# Patient Record
Sex: Female | Born: 1945
Health system: Southern US, Community
[De-identification: ages and names within clinical notes are randomized; demographics above are authoritative.]

## PROBLEM LIST (undated history)

## (undated) DIAGNOSIS — E538 Deficiency of other specified B group vitamins: Secondary | ICD-10-CM

## (undated) DIAGNOSIS — I10 Essential (primary) hypertension: Secondary | ICD-10-CM

## (undated) DIAGNOSIS — Z87442 Personal history of urinary calculi: Secondary | ICD-10-CM

## (undated) DIAGNOSIS — J449 Chronic obstructive pulmonary disease, unspecified: Secondary | ICD-10-CM

## (undated) DIAGNOSIS — D863 Sarcoidosis of skin: Secondary | ICD-10-CM

## (undated) HISTORY — PX: ABDOMINAL HYSTERECTOMY: SHX81

## (undated) HISTORY — DX: Personal history of urinary calculi: Z87.442

## (undated) HISTORY — DX: Chronic obstructive pulmonary disease, unspecified: J44.9

## (undated) HISTORY — DX: Deficiency of other specified B group vitamins: E53.8

## (undated) HISTORY — DX: Sarcoidosis of skin: D86.3

## (undated) HISTORY — DX: Essential (primary) hypertension: I10

## (undated) HISTORY — PX: CHOLECYSTECTOMY: SHX55

---

## 2000-10-02 ENCOUNTER — Other Ambulatory Visit: Admission: RE | Admit: 2000-10-02 | Discharge: 2000-10-02 | Payer: Self-pay | Admitting: Obstetrics and Gynecology

## 2000-10-28 ENCOUNTER — Encounter: Admission: RE | Admit: 2000-10-28 | Discharge: 2000-10-28 | Payer: Self-pay | Admitting: Family Medicine

## 2000-10-28 ENCOUNTER — Encounter: Payer: Self-pay | Admitting: Family Medicine

## 2005-05-07 ENCOUNTER — Emergency Department (HOSPITAL_COMMUNITY): Admission: EM | Admit: 2005-05-07 | Discharge: 2005-05-07 | Payer: Self-pay | Admitting: Emergency Medicine

## 2005-05-14 ENCOUNTER — Encounter: Admission: RE | Admit: 2005-05-14 | Discharge: 2005-05-14 | Payer: Self-pay | Admitting: Family Medicine

## 2005-06-11 ENCOUNTER — Encounter: Admission: RE | Admit: 2005-06-11 | Discharge: 2005-06-11 | Payer: Self-pay | Admitting: Family Medicine

## 2005-06-13 ENCOUNTER — Encounter: Admission: RE | Admit: 2005-06-13 | Discharge: 2005-06-13 | Payer: Self-pay | Admitting: Family Medicine

## 2006-08-18 ENCOUNTER — Ambulatory Visit: Payer: Self-pay | Admitting: Critical Care Medicine

## 2006-08-19 ENCOUNTER — Ambulatory Visit: Payer: Self-pay | Admitting: Cardiology

## 2006-09-25 ENCOUNTER — Ambulatory Visit: Payer: Self-pay | Admitting: Critical Care Medicine

## 2006-11-24 ENCOUNTER — Ambulatory Visit: Payer: Self-pay | Admitting: Critical Care Medicine

## 2008-03-04 LAB — CONVERTED CEMR LAB: Pap Smear: NORMAL

## 2009-01-03 ENCOUNTER — Emergency Department (HOSPITAL_COMMUNITY): Admission: EM | Admit: 2009-01-03 | Discharge: 2009-01-03 | Payer: Self-pay | Admitting: Family Medicine

## 2009-11-29 ENCOUNTER — Emergency Department (HOSPITAL_COMMUNITY): Admission: EM | Admit: 2009-11-29 | Discharge: 2009-11-29 | Payer: Self-pay | Admitting: Emergency Medicine

## 2010-03-16 ENCOUNTER — Other Ambulatory Visit: Payer: Self-pay | Admitting: Internal Medicine

## 2010-03-16 ENCOUNTER — Encounter: Payer: Self-pay | Admitting: Internal Medicine

## 2010-03-16 ENCOUNTER — Ambulatory Visit
Admission: RE | Admit: 2010-03-16 | Discharge: 2010-03-16 | Payer: Self-pay | Source: Home / Self Care | Attending: Internal Medicine | Admitting: Internal Medicine

## 2010-03-16 ENCOUNTER — Telehealth: Payer: Self-pay | Admitting: Internal Medicine

## 2010-03-16 DIAGNOSIS — D869 Sarcoidosis, unspecified: Secondary | ICD-10-CM | POA: Insufficient documentation

## 2010-03-16 DIAGNOSIS — R040 Epistaxis: Secondary | ICD-10-CM | POA: Insufficient documentation

## 2010-03-16 DIAGNOSIS — I10 Essential (primary) hypertension: Secondary | ICD-10-CM | POA: Insufficient documentation

## 2010-03-16 DIAGNOSIS — Z87442 Personal history of urinary calculi: Secondary | ICD-10-CM | POA: Insufficient documentation

## 2010-03-16 LAB — CBC WITH DIFFERENTIAL/PLATELET
Basophils Absolute: 0 10*3/uL (ref 0.0–0.1)
Basophils Relative: 0.6 % (ref 0.0–3.0)
Eosinophils Absolute: 0.2 10*3/uL (ref 0.0–0.7)
Eosinophils Relative: 3.8 % (ref 0.0–5.0)
HCT: 33.6 % — ABNORMAL LOW (ref 36.0–46.0)
Hemoglobin: 11.4 g/dL — ABNORMAL LOW (ref 12.0–15.0)
Lymphocytes Relative: 47.3 % — ABNORMAL HIGH (ref 12.0–46.0)
Lymphs Abs: 2.9 10*3/uL (ref 0.7–4.0)
MCHC: 34 g/dL (ref 30.0–36.0)
MCV: 98.5 fl (ref 78.0–100.0)
Monocytes Absolute: 0.4 10*3/uL (ref 0.1–1.0)
Monocytes Relative: 6.9 % (ref 3.0–12.0)
Neutro Abs: 2.5 10*3/uL (ref 1.4–7.7)
Neutrophils Relative %: 41.4 % — ABNORMAL LOW (ref 43.0–77.0)
Platelets: 595 10*3/uL — ABNORMAL HIGH (ref 150.0–400.0)
RBC: 3.41 Mil/uL — ABNORMAL LOW (ref 3.87–5.11)
RDW: 13.1 % (ref 11.5–14.6)
WBC: 6 10*3/uL (ref 4.5–10.5)

## 2010-03-16 LAB — URINALYSIS, ROUTINE W REFLEX MICROSCOPIC
Bilirubin Urine: NEGATIVE
Leukocytes, UA: NEGATIVE
Nitrite: NEGATIVE
Specific Gravity, Urine: >=1.03 (ref 1.000–1.030)
Urine Glucose: NEGATIVE
Urobilinogen, UA: 1 (ref 0.0–1.0)
pH: 5.5 (ref 5.0–8.0)

## 2010-03-16 LAB — HM MAMMOGRAPHY

## 2010-03-16 LAB — HEPATIC FUNCTION PANEL
ALT: 17 U/L (ref 0–35)
AST: 21 U/L (ref 0–37)
Albumin: 3.6 g/dL (ref 3.5–5.2)
Alkaline Phosphatase: 68 U/L (ref 39–117)
Bilirubin, Direct: 0.1 mg/dL (ref 0.0–0.3)
Total Bilirubin: 0.5 mg/dL (ref 0.3–1.2)
Total Protein: 7.1 g/dL (ref 6.0–8.3)

## 2010-03-16 LAB — BASIC METABOLIC PANEL
BUN: 10 mg/dL (ref 6–23)
CO2: 32 mEq/L (ref 19–32)
Calcium: 9.7 mg/dL (ref 8.4–10.5)
Chloride: 104 mEq/L (ref 96–112)
Creatinine, Ser: 0.7 mg/dL (ref 0.4–1.2)
GFR: 107.02 mL/min (ref >60.00)
Glucose, Bld: 98 mg/dL (ref 70–99)
Potassium: 4.3 mEq/L (ref 3.5–5.1)
Sodium: 141 mEq/L (ref 135–145)

## 2010-03-16 LAB — SEDIMENTATION RATE: Sed Rate: 63 mm/hr — ABNORMAL HIGH (ref 0–22)

## 2010-03-16 LAB — APTT: aPTT: 28.1 s (ref 21.7–28.8)

## 2010-03-16 LAB — HIGH SENSITIVITY CRP: CRP, High Sensitivity: 5.71 mg/L — ABNORMAL HIGH (ref 0.00–5.00)

## 2010-03-16 LAB — PROTIME-INR
INR: 1.1 ratio — ABNORMAL HIGH (ref 0.8–1.0)
Prothrombin Time: 12.7 s — ABNORMAL HIGH (ref 10.2–12.4)

## 2010-03-16 LAB — TSH: TSH: 0.75 u[IU]/mL (ref 0.35–5.50)

## 2010-03-20 ENCOUNTER — Encounter: Payer: Self-pay | Admitting: Internal Medicine

## 2010-03-25 ENCOUNTER — Encounter: Payer: Self-pay | Admitting: Family Medicine

## 2010-03-30 ENCOUNTER — Ambulatory Visit: Admit: 2010-03-30 | Payer: Self-pay | Admitting: Internal Medicine

## 2010-04-02 ENCOUNTER — Telehealth: Payer: Self-pay | Admitting: Internal Medicine

## 2010-04-04 ENCOUNTER — Encounter: Payer: Self-pay | Admitting: Internal Medicine

## 2010-04-05 NOTE — Assessment & Plan Note (Signed)
Summary: NEW PT CPX/ [employer] STATE/ [place]  #   Vital Signs:  Patient profile:   65 year old female Menstrual status:  hysterectomy Height:      66 inches Weight:      142 pounds BMI:     23.00 O2 Sat:      97 % on Room air Temp:     98.0 degrees F oral Pulse rate:   67 / minute Pulse rhythm:   regular Resp:     16 per minute BP sitting:   122 / 84  (left arm) Cuff size:   regular  Vitals Entered By: Rock Nephew CMA (March 16, 2010 9:03 AM)  O2 Flow:  Room air CC: New to establish, Preventive Care Is Patient Diabetic? No Pain Assessment Patient in pain? no       Does patient need assistance? Functional Status Self care Ambulation Normal     Menstrual Status hysterectomy Last PAP Result normal   Primary Care Provider:  [name] MD  CC:  New to establish and Preventive Care.  History of Present Illness: New to me she describes URI symptoms for about one month, she has had nasal congestion and runny nose that has slowly improved. She had right sided epistaxis about 2-3 weeks ago so she went to an [facility] and she has been using an OTC nasal spray with some relief. The bleeding has dissipated. She has had a mild NP cough as well but no SOB or night sweats.  Preventive Screening-Counseling & Management  Alcohol-Tobacco     Alcohol drinks/day: 0     Alcohol Counseling: not indicated; patient does not drink     Smoking Status: never     Tobacco Counseling: not indicated; no tobacco use  Caffeine-Diet-Exercise     Does Patient Exercise: yes  Hep-HIV-STD-Contraception     Hepatitis Risk: no risk noted     HIV Risk: no risk noted     STD Risk: no risk noted      Sexual History:  currently monogamous.        Drug Use:  no.        Blood Transfusions:  no.    Medications Prior to Update: 1)  None  Current Medications (verified): 1)  None  Allergies (verified): No Known Drug Allergies  Past History:  Past Medical History: Sarcoid of the  skin Nephrolithiasis, hx of Hypertension  Past Surgical History: Cholecystectomy Hysterectomy  Family History: Family History Hypertension  Social History: Occupation: special needs educator Divorced Never Smoked Alcohol use-no Drug use-no Regular exercise-yes Smoking Status:  never Hepatitis Risk:  no risk noted HIV Risk:  no risk noted STD Risk:  no risk noted Sexual History:  currently monogamous Blood Transfusions:  no Drug Use:  no Does Patient Exercise:  yes  Review of Systems       The patient complains of prolonged cough.  The patient denies anorexia, fever, weight loss, weight gain, hoarseness, chest pain, syncope, dyspnea on exertion, peripheral edema, headaches, hemoptysis, abdominal pain, melena, hematochezia, severe indigestion/heartburn, hematuria, suspicious skin lesions, difficulty walking, enlarged lymph nodes, and angioedema.   General:  Complains of fatigue; denies chills, fever, loss of appetite, malaise, sleep disorder, sweats, weakness, and weight loss. ENT:  Complains of nasal congestion and nosebleeds; denies decreased hearing, difficulty swallowing, ear discharge, earache, hoarseness, postnasal drainage, ringing in ears, sinus pressure, and sore throat. Resp:  Complains of cough; denies chest discomfort, chest pain with inspiration, coughing up blood, excessive snoring,  morning headaches, pleuritic, shortness of breath, sputum productive, and wheezing. Derm:  Denies changes in color of skin, changes in nail beds, dryness, excessive perspiration, flushing, hair loss, itching, lesion(s), and rash. Heme:  Denies abnormal bruising, enlarge lymph nodes, fevers, pallor, and skin discoloration.  Physical Exam  General:  alert, well-developed, well-nourished, well-hydrated, appropriate dress, normal appearance, healthy-appearing, cooperative to examination, good hygiene, and overweight-appearing.   Head:  normocephalic, atraumatic, no abnormalities observed,  and no abnormalities palpated.   Eyes:  vision grossly intact, pupils equal, and no injection.   Ears:  R ear normal and L ear normal.   Nose:  no external deformity, no nasal discharge, no airflow obstruction, no intranasal foreign body, no nasal polyps, no nasal mucosal lesions, no mucosal friability, no active bleeding or clots, no sinus percussion tenderness, no septum abnormalities, nasal mucosal pallor, and mucosal edema.   Mouth:  good dentition, pharynx pink and moist, no erythema, no exudates, no posterior lymphoid hypertrophy, no postnasal drip, no pharyngeal crowing, no lesions, no aphthous ulcers, no erosions, no tongue abnormalities, no leukoplakia, and no petechiae.   Neck:  supple, full ROM, no masses, no thyromegaly, no thyroid nodules or tenderness, no JVD, no carotid bruits, no cervical lymphadenopathy, and no neck tenderness.   Lungs:  normal respiratory effort, no intercostal retractions, no accessory muscle use, normal breath sounds, no dullness, no fremitus, no crackles, and no wheezes.   Heart:  normal rate, regular rhythm, no murmur, no gallop, no rub, and no JVD.   Abdomen:  soft, non-tender, normal bowel sounds, no distention, no masses, no guarding, no rigidity, no rebound tenderness, no abdominal hernia, no inguinal hernia, no hepatomegaly, no splenomegaly, and abdominal scar(s).   Msk:  No deformity or scoliosis noted of thoracic or lumbar spine.   Pulses:  R and L carotid,radial,femoral,dorsalis pedis and posterior tibial pulses are full and equal bilaterally Extremities:  No clubbing, cyanosis, edema, or deformity noted with normal full range of motion of all joints.   Neurologic:  No cranial nerve deficits noted. Station and gait are normal. Plantar reflexes are down-going bilaterally. DTRs are symmetrical throughout. Sensory, motor and coordinative functions appear intact. Skin:  over her lower legs bilaterally there are areas of PIPA but no papules, vesicles,  excoriations, or erythema Cervical Nodes:  no anterior cervical adenopathy and no posterior cervical adenopathy.   Axillary Nodes:  no R axillary adenopathy and no L axillary adenopathy.   Inguinal Nodes:  no R inguinal adenopathy and no L inguinal adenopathy.   Psych:  Cognition and judgment appear intact. Alert and cooperative with normal attention span and concentration. No apparent delusions, illusions, hallucinations   Impression & Recommendations:  Problem # 1:  HYPERTENSION (ICD-401.9) Assessment Improved  BP today: 122/84  Problem # 2:  COUGH (ICD-786.2) Assessment: New will check for sarcoid involvement, mass, pna, edema Orders: T-2 View CXR (71020TC) Venipuncture (19147) TLB-BMP (Basic Metabolic Panel-BMET) (80048-METABOL) TLB-CBC Platelet - w/Differential (85025-CBCD) TLB-Hepatic/Liver Function Pnl (80076-HEPATIC) TLB-TSH (Thyroid Stimulating Hormone) (84443-TSH) TLB-CRP-High Sensitivity (C-Reactive Protein) (86140-FCRP) TLB-Sedimentation Rate (ESR) (85652-ESR) TLB-Udip w/ Micro (81001-URINE) TLB-PT (Protime) (85610-PTP) TLB-PTT (85730-PTTL)  Problem # 3:  SARCOIDOSIS (ICD-135) Assessment: Unchanged  Orders: T-2 View CXR (71020TC) Venipuncture (82956) TLB-BMP (Basic Metabolic Panel-BMET) (80048-METABOL) TLB-CBC Platelet - w/Differential (85025-CBCD) TLB-Hepatic/Liver Function Pnl (80076-HEPATIC) TLB-TSH (Thyroid Stimulating Hormone) (84443-TSH) TLB-CRP-High Sensitivity (C-Reactive Protein) (86140-FCRP) TLB-Sedimentation Rate (ESR) (85652-ESR) TLB-Udip w/ Micro (81001-URINE) TLB-PT (Protime) (85610-PTP) TLB-PTT (85730-PTTL) Pulmonary Referral (Pulmonary)  Problem # 4:  EPISTAXIS, RECURRENT (ICD-784.7) Assessment:  New I think this has been caused by a recent URI and maybe some allergies, I see no bleeding, scabs, or clots today. Will check for a coagulopathy. Orders: Venipuncture (09811) TLB-BMP (Basic Metabolic Panel-BMET) (80048-METABOL) TLB-CBC  Platelet - w/Differential (85025-CBCD) TLB-Hepatic/Liver Function Pnl (80076-HEPATIC) TLB-TSH (Thyroid Stimulating Hormone) (84443-TSH) TLB-CRP-High Sensitivity (C-Reactive Protein) (86140-FCRP) TLB-Sedimentation Rate (ESR) (85652-ESR) TLB-Udip w/ Micro (81001-URINE) TLB-PT (Protime) (85610-PTP) TLB-PTT (85730-PTTL)  Other Orders: Radiology Referral (Radiology) Gastroenterology Referral (GI)  Colorectal Screening:  Current Recommendations:    Colonoscopy recommended: scheduled with G.I.  PAP Screening:    Hx Cervical Dysplasia in last 5 yrs? No    3 normal PAP smears in last 5 yrs? Yes    Last PAP smear:  03/04/2008    Reviewed PAP smear recommendations:  patient refuses understanding risks of delayed diagnosis  Mammogram Screening:    Last Mammogram:  03/04/2008    Reviewed Mammogram recommendations:  mammogram ordered  Osteoporosis Risk Assessment:  Risk Factors for Fracture or Low Bone Density:   Smoking status:       never  Immunization & Chemoprophylaxis:    Tetanus vaccine: Adacel  (03/04/2009)  Patient Instructions: 1)  Please schedule a follow-up appointment in 2 weeks. 2)  Check your Blood Pressure regularly. If it is above 140/90: you should make an appointment. 3)  Get plenty of rest, drink lots of clear liquids, and use Tylenol or Ibuprofen for fever and comfort. Return in 7-10 days if you're not better:sooner if you're feeling worse.   Orders Added: 1)  T-2 View CXR [71020TC] 2)  Venipuncture [36415] 3)  TLB-BMP (Basic Metabolic Panel-BMET) [80048-METABOL] 4)  TLB-CBC Platelet - w/Differential [85025-CBCD] 5)  TLB-Hepatic/Liver Function Pnl [80076-HEPATIC] 6)  TLB-TSH (Thyroid Stimulating Hormone) [84443-TSH] 7)  TLB-CRP-High Sensitivity (C-Reactive Protein) [86140-FCRP] 8)  TLB-Sedimentation Rate (ESR) [85652-ESR] 9)  TLB-Udip w/ Micro [81001-URINE] 10)  TLB-PT (Protime) [85610-PTP] 11)  TLB-PTT [85730-PTTL] 12)  Radiology Referral [Radiology] 13)   Gastroenterology Referral [GI] 14)  New Patient Level V [99205] 15)  Pulmonary Referral [Pulmonary]    Preventive Care Screening  Last Tetanus Booster:    Date:  03/04/2009    Results:  Adacel   Mammogram:    Date:  03/04/2008    Results:  normal   Pap Smear:    Date:  03/04/2008    Results:  normal    Prevention & Chronic Care Immunizations   Influenza vaccine: Not documented   Influenza vaccine deferral: Refused  (03/16/2010)    Tetanus booster: 03/04/2009: Adacel    Pneumococcal vaccine: Not documented    H. zoster vaccine: Not documented   H. zoster vaccine deferral: Refused  (03/16/2010)  Colorectal Screening   Hemoccult: Not documented    Colonoscopy: Not documented   Colonoscopy action/deferral: scheduled with G.I.  (03/16/2010)  Other Screening   Pap smear: normal  (03/04/2008)   Pap smear action/deferral: patient refuses understanding risks of delayed diagnosis  (03/16/2010)    Mammogram: normal  (03/04/2008)   Mammogram action/deferral: mammogram ordered  (03/16/2010)    DXA bone density scan: Not documented   Smoking status: never  (03/16/2010)  Lipids   Total Cholesterol: Not documented   LDL: Not documented   LDL Direct: Not documented   HDL: Not documented   Triglycerides: Not documented  Hypertension   Last Blood Pressure: 122 / 84  (03/16/2010)   Serum creatinine: Not documented   Serum potassium Not documented  Self-Management Support :    Hypertension self-management support: Not documented

## 2010-04-05 NOTE — Letter (Signed)
Summary: Results Follow-up Letter  [facility]  [address] [place], [place] [address]   Phone: [phone]  Fax: [phone]    03/16/2010  [address] [place], [place]  [address]  Dear Ms. Stanczak,   The following are the results of your recent test(s):  Test     Result     Chest Xray     pneumonia and scarring CBC       anemia and high platelets Liver/kidney   normal Urine       normal   _________________________________________________________  Please call for an appointment soon _________________________________________________________ _________________________________________________________ _________________________________________________________  Sincerely,  Sanda Linger MD [facility]

## 2010-04-05 NOTE — Letter (Signed)
Summary: Unable To Reach-Consult Scheduled  [facility]  [address] [place], [place] [address]   Phone: [phone]  Fax: [phone]    03/20/2010 MRN: [id number]    Dear Ms. Sanzone,   We have been unable to reach you by phone.  Please contact our office regarding your recent chest xray results.    Thank you,    Alvy Beal A,CMA for Dr. Bea Laura. [facility]

## 2010-04-05 NOTE — Progress Notes (Signed)
  Phone Note Outgoing Call   Summary of Call: LA - please let her know that her chest xray shows a probable pneumonia today, ask her where she wants the antibiotic Rx sent to  TJ Initial call taken by: [name] MD,  March 16, 2010 11:49 AM  Follow-up for Phone Call        LM with other for pt to call  back.Alvy Beal Archie CMA  March 16, 2010 1:26 PM  Follow-up by: [name] MD,  March 16, 2010 11:48 AM  Additional Follow-up for Phone Call Additional follow up Details #1::        Called pt x 2, no vm on home phone and work number is for [employer].  will try againg and also mailed her letter for her to contact our office back regarding results.Alvy Beal Archie CMA  March 20, 2010 11:56 AM     Additional Follow-up for Phone Call Additional follow up Details #2::    Tried patient x 2, closing phone note due to multiple attempts. Letter mailed for patient to call us back.Alvy Beal Archie CMA  March 21, 2010 9:21 AM   New/Updated Medications: AVELOX 400 MG TABS (MOXIFLOXACIN HCL) One by mouth once daily for 10 days Prescriptions: AVELOX 400 MG TABS (MOXIFLOXACIN HCL) One by mouth once daily for 10 days  #10 x 1   Entered and Authorized by:   [name] MD   Signed by:   [name] MD on 03/16/2010   Method used:   Historical   RxID:   [id number]

## 2010-04-11 NOTE — Progress Notes (Signed)
Summary: nos appt  Phone Note Call from Patient   Caller: [email or web address]  Call For: Arnisha Laffoon Summary of Call: ATC pt to rsc nos from 1/27 no vm. Initial call taken by: Darletta Moll,  April 02, 2010 10:45 AM     Appended Document: nos appt pat, this is your patient  Carmin Muskrat

## 2010-04-27 ENCOUNTER — Encounter: Payer: Self-pay | Admitting: Internal Medicine

## 2010-05-01 NOTE — Letter (Signed)
Summary: Referral - not able to see patient  [facility]  [address] [place], [place] [address]   Phone: [phone]  Fax: [phone]    April 27, 2010    [name], M.D. [address] [place], [place] [address]   Re:   SREENIDHI GANSON DOB:  12-May-1945 MRN:   [id number]    Dear Dr. Yetta Barre:  Thank you for your kind referral of the above patient.  We have attempted to schedule the recommended procedure Screening Colonoscopy but have not been able to schedule because:   X  The patient was not available by phone and/or has not returned our calls.  ___ The patient declined to schedule the procedure at this time.  We appreciate the referral and hope that we will have the opportunity to treat this patient in the future.    Sincerely,    [facility] [phone]

## 2010-07-17 NOTE — Assessment & Plan Note (Signed)
[facility]                             PULMONARY OFFICE NOTE   NDIA, SAMPATH                    MRN:          [id number]  DATE:08/18/2006                            DOB:          Apr 26, 1945    CHIEF COMPLAINT:  Chronic cough, evaluate sarcoidosis.   HISTORY OF PRESENT ILLNESS:  A 65 year old, African-American female with  a history of sarcoidosis diagnosed in the early 70s. The patient is  unclear whether a biopsy was actually obtained. She was found at that  time to have fibrocystic changes bilaterally and was on chronic steroids  for many years. She eventually took steroids off on her own over 10  years. She was seen in consultation by Dr. Sandrea Hughs in 2002 who felt  that she had burned out sarcoidosis. She only came for one visit and did  not return for followup. In the interim, she did fairly well until 2  weeks ago she developed increasing cough, increased chest congestion  production thick yellow mucous. She was seen at [facility],  given a 7-day course of Levaquin at 500 mg daily which did improve her  status. Symptoms now are better, her cough is less. She is still staying  somewhat fatigued. Her mucous is clear. She is short of breath all the  time and if she walks 10-15 feet or greater or up an include, she is  quite dyspneic. 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 She does have occasional chest tightness, she  denies any hemoptysis. She has no acid reflux or symptoms of  indigestion, denies any loss of appetite. There has no been weight  change, abdominal pain, difficulty swallowing. The patient denies any  sore throat, tooth or dental problems. There is no nasal congestion,  sneezing, itching, earache, anxiety, depression, hand or foot swelling.  She smoked 1/2-pack a day for 10 years, quit smoking in the 1970s.   PAST MEDICAL HISTORY:   MEDICAL:  No history of active medical  issues except for a  cholecystectomy in the 1970s and the sarcoidosis.   MEDICATION ALLERGIES:  Vitamins only.   SOCIAL HISTORY:  Works as noted above in the [employer]  system, is single, has children, lives at home with a friend.   FAMILY HISTORY:  Noncontributory.   PHYSICAL EXAMINATION:  GENERAL:  This is a middle-age, African-American  female in no distress.  VITAL SIGNS:  Temperature 98, blood pressure 102/64, pulse 87,  saturation 95% on room air.  CHEST:  Showed a few dry crackles at the bases otherwise clear prolonged  respiratory phase noted.  CARDIAC:  Showed a regular rate and rhythm without S3, normal S1, S2.  ABDOMEN:  Soft, nontender.  EXTREMITIES:  Showed no edema, clubbing or venous disease.  SKIN:  Clear.  NEUROLOGIC:  Intact.  HEENT:  Showed no jugular venous distention, no lymphadenopathy.  Oropharynx clear.  NECK:  Supple.   Chest x-ray obtained from August 12, 2006 at [facility] showed  bilateral apical fibrotic changes of a large cavitary lesion right upper  lobe,  this appears to be unchanged compared to a verbal report from a CT  of the chest August 2002 which showed extensive bilateral upper lobe  parenchymal pleural scarring, bleb and bulla formation is evident in the  upper lobes.   IMPRESSION:  Sarcoidosis with chronic fibrocystic changes and no  evidence of active disease process at this time. Recent  tracheobronchitis now improved.   PLAN:  Obtain a CT scan of the chest on August 19, 2006 and full set of  pulmonary functions. The patient is to return to see me at that time on  September 25, 2006. No change in the patient's medication profile is made at  this time.     Charlcie Cradle Delford Field, MD, [facility]  Electronically Signed    PEW/MedQ  DD: 08/18/2006  DT: 08/19/2006  Job #: [id number]   cc:   Peyton Najjar, MD

## 2010-07-17 NOTE — Assessment & Plan Note (Signed)
[facility]                             PULMONARY OFFICE NOTE   Patricia Davies, Patricia Davies                    MRN:          [id number]  DATE:11/24/2006                            DOB:          Aug 18, 1945    Patricia Davies is a 65 year old African-American female, history of  sarcoidosis with chronic fibro-proliferation and bronchiectasis.  Pulmonary functions have been obtained and are stable.  CT scan of the  chest also is stable from June.  She is having no active complaints at  this time, except for dyspnea on exertion.   EXAM:  Temperature 97, blood pressure 130/80, pulse 73, saturation 96%  on room air.  CHEST:  Showed to be clear without evidence of wheeze or rhonchi.  CARDIAC:  Regular rate and rhythm without S3.  Normal S1, S2.   IMPRESSION:  Sarcoidosis, stable at this time.   PLAN:  To maintain off systemic medications, and we will see this  patient back in return in 4 months.     Charlcie Cradle Delford Field, MD, [facility]  Electronically Signed    PEW/MedQ  DD: 11/24/2006  DT: 11/25/2006  Job #: [id number]

## 2010-07-17 NOTE — Assessment & Plan Note (Signed)
[facility]                             PULMONARY OFFICE NOTE   TURQUOISE, ESCH                    MRN:          [id number]  DATE:09/25/2006                            DOB:          September 23, 1945    Ms. Gentzler returns in followup, a 65 year old African-American female  with sarcoidosis.  She underwent CT scanning of the chest which revealed  extensive pulmonary scarring and retraction compatible to sarcoidosis.  There was bilateral bronchiectasis and also ill defined air space  disease in the right upper lobe, not previously present.  We gave her  another additional 10 day course of Levaquin and she completed this.  She states her cough and congestion are markedly better.  She is having  no shortness of breath or cough at this time.  She is here today for a  pulmonary function testing.   On exam temp 97, blood pressure 118/74, pulse of 68, saturation was 97%  room air.  CHEST:  Clear without evidence of adventitious breath sounds.  There was  no evidence of wheeze or rhonchi.  CARDIAC:  Regular rate and rhythm without S3.  Normal S1, S2.  ABDOMEN:  Soft, nontender.  EXTREMITIES:  No edema or clubbing.  SKIN:  Clear.   PULMONARY FUNCTIONS:  Obtained, showing an FEV1 of 56% predicted, FEC is  73% predicted.  Total lung capacity at 67% predicted.  Diffusion  capacity at 42% predicted.   IMPRESSION:  Is that of fibrocystic sarcoidosis with bronchiectasis,  stable at this time.  No indication for systemic steroids.  No  indication for topical inhaled medications.   PLAN:  Is to maintain off prednisone and return in followup in 4 months  for a recheck.     Charlcie Cradle Delford Field, MD, [facility]  Electronically Signed    PEW/MedQ  DD: 09/25/2006  DT: 09/26/2006  Job #: [id number]   cc:   Peyton Najjar, MD

## 2010-12-15 ENCOUNTER — Emergency Department (HOSPITAL_COMMUNITY): Payer: BC Managed Care – PPO

## 2010-12-15 ENCOUNTER — Inpatient Hospital Stay (HOSPITAL_COMMUNITY)
Admission: EM | Admit: 2010-12-15 | Discharge: 2010-12-19 | DRG: 089 | Disposition: A | Discharge status: Home / Self Care | Payer: BC Managed Care – PPO | Attending: Internal Medicine | Admitting: Internal Medicine

## 2010-12-15 DIAGNOSIS — E876 Hypokalemia: Secondary | ICD-10-CM | POA: Diagnosis present

## 2010-12-15 DIAGNOSIS — D869 Sarcoidosis, unspecified: Secondary | ICD-10-CM | POA: Diagnosis present

## 2010-12-15 DIAGNOSIS — Z23 Encounter for immunization: Secondary | ICD-10-CM

## 2010-12-15 DIAGNOSIS — D72829 Elevated white blood cell count, unspecified: Secondary | ICD-10-CM | POA: Diagnosis present

## 2010-12-15 DIAGNOSIS — J189 Pneumonia, unspecified organism: Principal | ICD-10-CM | POA: Diagnosis present

## 2010-12-15 DIAGNOSIS — Z79899 Other long term (current) drug therapy: Secondary | ICD-10-CM

## 2010-12-15 DIAGNOSIS — D509 Iron deficiency anemia, unspecified: Secondary | ICD-10-CM | POA: Diagnosis present

## 2010-12-15 DIAGNOSIS — N390 Urinary tract infection, site not specified: Secondary | ICD-10-CM | POA: Diagnosis present

## 2010-12-15 LAB — URINE MICROSCOPIC-ADD ON

## 2010-12-15 LAB — BASIC METABOLIC PANEL
Calcium: 9.7 mg/dL (ref 8.4–10.5)
Chloride: 98 mEq/L (ref 96–112)
GFR calc Af Amer: 84 mL/min — ABNORMAL LOW (ref >90)
Potassium: 3 mEq/L — ABNORMAL LOW (ref 3.5–5.1)
Sodium: 140 mEq/L (ref 135–145)

## 2010-12-15 LAB — DIFFERENTIAL
Basophils Absolute: 0.2 10*3/uL — ABNORMAL HIGH (ref 0.0–0.1)
Lymphocytes Relative: 27 % (ref 12–46)
Monocytes Relative: 14 % — ABNORMAL HIGH (ref 3–12)
Neutrophils Relative %: 56 % (ref 43–77)

## 2010-12-15 LAB — COMPREHENSIVE METABOLIC PANEL
BUN: 20 mg/dL (ref 6–23)
CO2: 31 mEq/L (ref 19–32)
Chloride: 101 mEq/L (ref 96–112)
Total Bilirubin: 0.7 mg/dL (ref 0.3–1.2)
Total Protein: 7.7 g/dL (ref 6.0–8.3)

## 2010-12-15 LAB — URINALYSIS, ROUTINE W REFLEX MICROSCOPIC
Ketones, ur: 15 mg/dL — AB
Nitrite: POSITIVE — AB
Protein, ur: 100 mg/dL — AB
Specific Gravity, Urine: 1.031 — ABNORMAL HIGH (ref 1.005–1.030)
Urobilinogen, UA: 2 mg/dL — ABNORMAL HIGH (ref 0.0–1.0)

## 2010-12-15 LAB — RAPID URINE DRUG SCREEN, HOSP PERFORMED
Amphetamines: NOT DETECTED
Benzodiazepines: NOT DETECTED
Tetrahydrocannabinol: NOT DETECTED

## 2010-12-15 LAB — TROPONIN I: Troponin I: <0.3 ng/mL (ref <0.30)

## 2010-12-15 LAB — CK TOTAL AND CKMB (NOT AT ARMC): CK, MB: 2.5 ng/mL (ref 0.3–4.0)

## 2010-12-15 LAB — HEMOGLOBIN A1C
Hgb A1c MFr Bld: 5.4 % (ref <5.7)
Mean Plasma Glucose: 108 mg/dL (ref <117)

## 2010-12-15 LAB — CBC
HCT: 31.2 % — ABNORMAL LOW (ref 36.0–46.0)
Hemoglobin: 10.3 g/dL — ABNORMAL LOW (ref 12.0–15.0)
MCH: 30 pg (ref 26.0–34.0)
RBC: 3.43 MIL/uL — ABNORMAL LOW (ref 3.87–5.11)

## 2010-12-15 LAB — PRO B NATRIURETIC PEPTIDE: Pro B Natriuretic peptide (BNP): 125.1 pg/mL — ABNORMAL HIGH (ref 0–125)

## 2010-12-15 LAB — MAGNESIUM: Magnesium: 2.3 mg/dL (ref 1.5–2.5)

## 2010-12-16 LAB — BASIC METABOLIC PANEL
Calcium: 8.8 mg/dL (ref 8.4–10.5)
GFR calc Af Amer: >90 mL/min (ref >90)
GFR calc non Af Amer: >90 mL/min (ref >90)
Potassium: 4.1 mEq/L (ref 3.5–5.1)
Sodium: 141 mEq/L (ref 135–145)

## 2010-12-16 LAB — CBC
MCH: 29.9 pg (ref 26.0–34.0)
MCHC: 32.3 g/dL (ref 30.0–36.0)
RDW: 14.5 % (ref 11.5–15.5)

## 2010-12-17 ENCOUNTER — Inpatient Hospital Stay (HOSPITAL_COMMUNITY): Payer: BC Managed Care – PPO

## 2010-12-17 LAB — URINE CULTURE
Colony Count: 75000
Culture  Setup Time: 201210132008

## 2010-12-17 LAB — CBC
HCT: 27 % — ABNORMAL LOW (ref 36.0–46.0)
MCH: 29.4 pg (ref 26.0–34.0)
MCV: 92.2 fL (ref 78.0–100.0)
Platelets: 382 10*3/uL (ref 150–400)
RBC: 2.93 MIL/uL — ABNORMAL LOW (ref 3.87–5.11)
WBC: 11.5 10*3/uL — ABNORMAL HIGH (ref 4.0–10.5)

## 2010-12-18 LAB — CBC
Hemoglobin: 8.3 g/dL — ABNORMAL LOW (ref 12.0–15.0)
MCH: 30.1 pg (ref 26.0–34.0)
MCHC: 33.1 g/dL (ref 30.0–36.0)
Platelets: 386 10*3/uL (ref 150–400)
RDW: 14.2 % (ref 11.5–15.5)

## 2010-12-18 LAB — IRON AND TIBC
Saturation Ratios: 10 % — ABNORMAL LOW (ref 20–55)
TIBC: 173 ug/dL — ABNORMAL LOW (ref 250–470)

## 2010-12-18 LAB — BASIC METABOLIC PANEL
Calcium: 9.4 mg/dL (ref 8.4–10.5)
GFR calc Af Amer: >90 mL/min (ref >90)
GFR calc non Af Amer: >90 mL/min (ref >90)
Potassium: 3.5 mEq/L (ref 3.5–5.1)
Sodium: 141 mEq/L (ref 135–145)

## 2010-12-18 LAB — VITAMIN B12: Vitamin B-12: 417 pg/mL (ref 211–911)

## 2010-12-18 LAB — FERRITIN: Ferritin: 752 ng/mL — ABNORMAL HIGH (ref 10–291)

## 2010-12-18 LAB — FOLATE: Folate: 6.3 ng/mL

## 2010-12-21 LAB — CULTURE, BLOOD (ROUTINE X 2)
Culture  Setup Time: 201210131747
Culture: NO GROWTH

## 2011-01-16 ENCOUNTER — Ambulatory Visit (INDEPENDENT_AMBULATORY_CARE_PROVIDER_SITE_OTHER): Payer: BC Managed Care – PPO | Admitting: Internal Medicine

## 2011-01-16 ENCOUNTER — Other Ambulatory Visit (INDEPENDENT_AMBULATORY_CARE_PROVIDER_SITE_OTHER): Payer: BC Managed Care – PPO

## 2011-01-16 ENCOUNTER — Encounter: Payer: Self-pay | Admitting: Internal Medicine

## 2011-01-16 VITALS — BP 142/82 | HR 67 | Temp 98.3°F | Resp 14 | Ht 62.0 in | Wt 139.0 lb

## 2011-01-16 DIAGNOSIS — R7309 Other abnormal glucose: Secondary | ICD-10-CM

## 2011-01-16 DIAGNOSIS — R739 Hyperglycemia, unspecified: Secondary | ICD-10-CM | POA: Insufficient documentation

## 2011-01-16 DIAGNOSIS — I10 Essential (primary) hypertension: Secondary | ICD-10-CM

## 2011-01-16 DIAGNOSIS — D869 Sarcoidosis, unspecified: Secondary | ICD-10-CM

## 2011-01-16 LAB — BASIC METABOLIC PANEL
CO2: 31 mEq/L (ref 19–32)
Calcium: 9.6 mg/dL (ref 8.4–10.5)
Creatinine, Ser: 0.6 mg/dL (ref 0.4–1.2)
GFR: 140.36 mL/min (ref >60.00)
Sodium: 140 mEq/L (ref 135–145)

## 2011-01-16 LAB — HEMOGLOBIN A1C: Hgb A1c MFr Bld: 5 % (ref 4.6–6.5)

## 2011-01-16 NOTE — Patient Instructions (Signed)

## 2011-01-16 NOTE — Progress Notes (Signed)
  Subjective:    Patient ID: Patricia Davies, female    DOB: [birth year], 65 y.o.   MRN: [id number]  HPI She returns for f/up after being admitted to the hospital about one month ago. She had developed a productive cough and was admitted for PNA, sarcoid flare up, and possible asthma. She has completed the antibiotics (Avelox) and is feeling much better. She needs FMLA paperwork completed today and she tells me that she see Dr. Maple Hudson next week.    Review of Systems  Constitutional: Positive for fatigue. Negative for fever, chills, diaphoresis, activity change, appetite change and unexpected weight change.  HENT: Negative.   Eyes: Negative.   Respiratory: Negative for cough, choking, shortness of breath, wheezing and stridor.   Cardiovascular: Negative for chest pain, palpitations and leg swelling.  Gastrointestinal: Negative for nausea, vomiting, abdominal pain, diarrhea and constipation.  Genitourinary: Negative for dysuria, urgency, frequency, hematuria, flank pain, decreased urine volume, enuresis, difficulty urinating and dyspareunia.  Musculoskeletal: Negative for myalgias, back pain, joint swelling, arthralgias and gait problem.  Skin: Negative.   Neurological: Negative for dizziness, tremors, seizures, syncope, facial asymmetry, speech difficulty, weakness, light-headedness, numbness and headaches.  Hematological: Negative for adenopathy. Does not bruise/bleed easily.  Psychiatric/Behavioral: Negative.        Objective:   Physical Exam  Vitals reviewed. Constitutional: She is oriented to person, place, and time. She appears well-developed and well-nourished. No distress.  HENT:  Head: Normocephalic and atraumatic.  Mouth/Throat: Oropharynx is clear and moist. No oropharyngeal exudate.  Eyes: Conjunctivae are normal. Right eye exhibits no discharge. Left eye exhibits no discharge. No scleral icterus.  Neck: Normal range of motion. Neck supple. No JVD present. No tracheal deviation  present. No thyromegaly present.  Cardiovascular: Normal rate, regular rhythm, normal heart sounds and intact distal pulses.  Exam reveals no gallop and no friction rub.   No murmur heard. Pulmonary/Chest: Effort normal and breath sounds normal. No stridor. No respiratory distress. She has no wheezes. She has no rales. She exhibits no tenderness.  Abdominal: Soft. Bowel sounds are normal. She exhibits no distension. There is no tenderness. There is no rebound and no guarding.  Musculoskeletal: Normal range of motion. She exhibits no edema and no tenderness.  Lymphadenopathy:    She has no cervical adenopathy.  Neurological: She is oriented to person, place, and time.  Skin: Skin is warm and dry. No rash noted. She is not diaphoretic. No erythema. No pallor.  Psychiatric: She has a normal mood and affect. Her behavior is normal. Judgment and thought content normal.     Lab Results  Component Value Date   WBC 10.1 12/18/2010   HGB 8.3* 12/18/2010   HCT 25.1* 12/18/2010   PLT 386 12/18/2010   GLUCOSE 158* 12/18/2010   ALT 29 12/15/2010   AST 33 12/15/2010   NA 141 12/18/2010   K 3.5 12/18/2010   CL 104 12/18/2010   CREATININE 0.59 12/18/2010   BUN 8 12/18/2010   CO2 29 12/18/2010   TSH 1.123 12/15/2010   INR 1.1* 03/16/2010   HGBA1C 5.4 12/15/2010       Assessment & Plan:

## 2011-01-17 NOTE — Assessment & Plan Note (Signed)
Her BP is well controlled 

## 2011-01-17 NOTE — Assessment & Plan Note (Signed)
This has cleared up, I completed her FMLA paperwork today

## 2011-01-17 NOTE — Assessment & Plan Note (Signed)
I will check her a1c today to see if she has developed DM

## 2011-01-23 ENCOUNTER — Inpatient Hospital Stay: Payer: BC Managed Care – PPO | Admitting: Internal Medicine

## 2011-01-30 ENCOUNTER — Emergency Department (HOSPITAL_COMMUNITY): Payer: BC Managed Care – PPO

## 2011-01-30 ENCOUNTER — Telehealth: Payer: Self-pay | Admitting: Internal Medicine

## 2011-01-30 ENCOUNTER — Emergency Department (HOSPITAL_COMMUNITY)
Admission: EM | Admit: 2011-01-30 | Discharge: 2011-01-30 | Disposition: A | Discharge status: Home / Self Care | Payer: BC Managed Care – PPO | Attending: Emergency Medicine | Admitting: Emergency Medicine

## 2011-01-30 ENCOUNTER — Encounter (HOSPITAL_COMMUNITY): Payer: Self-pay | Admitting: *Deleted

## 2011-01-30 DIAGNOSIS — I10 Essential (primary) hypertension: Secondary | ICD-10-CM | POA: Insufficient documentation

## 2011-01-30 DIAGNOSIS — R42 Dizziness and giddiness: Secondary | ICD-10-CM | POA: Insufficient documentation

## 2011-01-30 DIAGNOSIS — H538 Other visual disturbances: Secondary | ICD-10-CM | POA: Insufficient documentation

## 2011-01-30 DIAGNOSIS — Z79899 Other long term (current) drug therapy: Secondary | ICD-10-CM | POA: Insufficient documentation

## 2011-01-30 LAB — POCT I-STAT, CHEM 8
Calcium, Ion: 1.21 mmol/L (ref 1.12–1.32)
HCT: 38 % (ref 36.0–46.0)
Hemoglobin: 12.9 g/dL (ref 12.0–15.0)
Sodium: 142 mEq/L (ref 135–145)
TCO2: 28 mmol/L (ref 0–100)

## 2011-01-30 MED ORDER — ONDANSETRON HCL 4 MG PO TABS: 4.0000 mg | ORAL_TABLET | Freq: Four times a day (QID) | ORAL | Status: AC

## 2011-01-30 MED ORDER — MECLIZINE HCL 25 MG PO TABS: 25.0000 mg | ORAL_TABLET | Freq: Four times a day (QID) | ORAL | Status: AC

## 2011-01-30 NOTE — Telephone Encounter (Signed)
I spoke with pt and she c/o blurred vision, is off balance, very SOB, is very dizzy. I advised pt she needed to go to ER to be evaluated and to have someone drive her since she was feeling that way. She states she will go to [facility] and will have someone else drive her.

## 2011-01-30 NOTE — ED Provider Notes (Signed)
  12:58 PM MRI of brain to r/o posterior stroke. Patient has had negative swallow screen. Reports that it started two days ago. Unsteady gait. Blurred vision. History of vertigo. Pending MRI.  Demetrius Charity, PA 01/30/11 1441  Demetrius Charity, [place] 01/30/11 1533

## 2011-01-30 NOTE — ED Provider Notes (Signed)
History     CSN: [id number] Arrival date & time: 01/30/2011 11:10 AM   First MD Initiated Contact with Patient 01/30/11 1136      Chief Complaint  Patient presents with  . Dizziness  . Blurred Vision    (Consider location/radiation/quality/duration/timing/severity/associated sxs/prior treatment) HPI  States she has remote history of some inner ear problems with associated dizziness presents to emergency department complaining of a 2 day history of gradual onset dizziness, intermittent blurred vision and intermittent difficulty ambulating due to feeling "off balance." Patient states symptoms have been gradual onset, persistent, and worsening. Patient did not take any medication for these symptoms prior to arrival. Patient denies headache, loss of vision, nausea, vomiting, weight loss, chest pain, shortness of breath, abdominal pain, slurred speech, extremity numbness/tingling/weakness. Patient denies history of TIA or stroke. Patient states history of high blood pressure and sarcoidosis. Symptoms are aggravated by movement and improved with lying still.   Past Medical History  Diagnosis Date  . Hypertension   . History of nephrolithiasis   . Sarcoidosis of skin     Past Surgical History  Procedure Date  . Cholecystectomy   . Abdominal hysterectomy     Family History  Problem Relation Age of Onset  . Hypertension Other     History  Substance Use Topics  . Smoking status: Never Smoker   . Smokeless tobacco: Not on file   Comment: Regukar exercise - Yes  . Alcohol Use: No    OB History    Grav Para Term Preterm Abortions TAB SAB Ect Mult Living                  Review of Systems  All other systems reviewed and are negative.    Allergies  Review of patient's allergies indicates no known allergies.  Home Medications   Current Outpatient Rx  Name Route Sig Dispense Refill  . ALBUTEROL SULFATE HFA 108 (90 BASE) MCG/ACT IN AERS Inhalation Inhale 1 puff into the  lungs every 4 (four) hours as needed. For shortness of breath    . FERROUS SULFATE 325 (65 FE) MG PO TABS Oral Take 325 mg by mouth 2 (two) times daily.      . GUAIFENESIN 600 MG PO TB12 Oral Take 1,200 mg by mouth 2 (two) times daily.      Marland Kitchen HYDROCODONE-ACETAMINOPHEN 5-325 MG PO TABS Oral Take 1 tablet by mouth every 4 (four) hours as needed. 1-2 tablets q4hr for pain    . ZOLPIDEM TARTRATE 5 MG PO TABS Oral Take 5 mg by mouth at bedtime as needed.        BP 151/73  Pulse 57  Temp(Src) 97.7 F (36.5 C) (Oral)  Resp 16  SpO2 99%  Physical Exam  ED Course  Procedures (including critical care time)  Stroke swallow screen.  Patient to be moved to CDU pending MRI. Report given to [facility].   Date: 01/30/2011  Rate: 59  Rhythm: normal sinus rhythm and premature ventricular contractions (PVC)  QRS Axis: normal  Intervals: normal  ST/T Wave abnormalities: normal  Conduction Disutrbances:none  Narrative Interpretation:   Old EKG Reviewed: no significant changes from Dec 15, 2010    Labs Reviewed  POCT I-STAT, CHEM 8 - Abnormal; Notable for the following:    Potassium 3.1 (*)    Glucose, Bld 107 (*)    All other components within normal limits  I-STAT, CHEM 8   Mr Brain Wo Contrast  01/30/2011  *RADIOLOGY REPORT*  Clinical Data: Dizziness.  Vertigo.  MRI HEAD WITHOUT CONTRAST  Technique:  Multiplanar, multiecho pulse sequences of the brain and surrounding structures were obtained according to standard protocol without intravenous contrast.  Comparison: None.  Findings: No acute infarct.  No intracranial hemorrhage.  Prominent nonspecific white matter type changes may be related to result of small vessel disease in this hypertensive patient. Given the perpendicular distribution to the ventricles, demyelinating process such as multiple sclerosis not excluded.  Other causes of white matter changes which cannot be completely excluded although felt to be less likely include that  secondary to; vasculitis, inflammatory/infectious process and unlikely migraine headaches.  No intracranial mass lesion detected on this unenhanced exam.  Global atrophy without hydrocephalus.  Major intracranial vascular structures are patent.  IMPRESSION: No acute infarct.  Prominent nonspecific white matter type changes may be related to result of small vessel disease in this hypertensive patient. Given the perpendicular distribution to the ventricles, demyelinating process such as multiple sclerosis not excluded.  Please see above.  Original Report Authenticated By: Fuller Canada, M.D.     1. Vertigo       MDM          Jenness Corner, PA 01/30/11 1544

## 2011-01-30 NOTE — ED Provider Notes (Addendum)
Physical exam  CONSTITUTIONAL: Well developed/well nourished HEAD AND FACE: Normocephalic/atraumatic EYES: EOMI/PERRL ENMT: Mucous membranes moist NECK: supple no meningeal signs SPINE:entire spine nontender CV: S1/S2 noted, no murmurs/rubs/gallops noted LUNGS: Lungs are clear to auscultation bilaterally, no apparent distress ABDOMEN: soft, nontender, no rebound or guarding GU:no cva tenderness NEURO: Pt is awake/alert, moves all extremitiesx4 Able to walk but must walk slowly No facial/arm drift noted EXTREMITIES: pulses normal, full ROM SKIN: warm, color normal PSYCH: no abnormalities of mood noted     Medical screening examination/treatment/procedure(s) were conducted as a shared visit with non-physician practitioner(s) and myself.  I personally evaluated the patient during the encounter   Joya Gaskins, MD 01/30/11 1704  Joya Gaskins, MD 01/30/11 [phone]

## 2011-01-30 NOTE — ED Notes (Signed)
Pt reports dizziness, blurred vision and unsteady gait x 2 days.

## 2011-01-30 NOTE — ED Notes (Signed)
Called lab tech .I spoke with veronica about pt having 1 hour delay in getting labs drawn.

## 2011-01-30 NOTE — ED Provider Notes (Signed)
Pt seen with PA Pt with difficulty walking, reports vertigo started two days Will get MR imaging  tPA in stroke considered but not given due to:  Onset over 3-4.5hours Unclear if this is CVA vs. Peripheral vertigo  Joya Gaskins, MD 01/30/11 1243

## 2011-01-30 NOTE — ED Provider Notes (Signed)
I observed pt ambulate to the bathroom and back to her room without assistance. Pt MRI negative for posterior stroke. Did show some demyelinating process, however, the patient does not exhibit signs of MS. Pt would like to go home and has support with her daughter and grandson.   Dorthula Matas, PA 01/30/11 1545  Dorthula Matas, PA 01/30/11 1546  Dorthula Matas, PA 01/30/11 1603

## 2011-01-30 NOTE — ED Provider Notes (Signed)
Medical screening examination/treatment/procedure(s) were conducted as a shared visit with non-physician practitioner(s) and myself.  I personally evaluated the patient during the encounter   Joya Gaskins, MD 01/30/11 1704

## 2011-01-30 NOTE — ED Notes (Signed)
Pa at bedside discussing results and discharge plan with pt

## 2011-01-30 NOTE — ED Provider Notes (Signed)
Medical screening examination/treatment/procedure(s) were conducted as a shared visit with non-physician practitioner(s) and myself.  I personally evaluated the patient during the encounter   Vann Okerlund W Eliany Mccarter, MD 01/30/11 1704 

## 2011-01-30 NOTE — ED Notes (Signed)
Alert oriented...states still some dizziness. States  Vision wnl at this time

## 2011-01-31 ENCOUNTER — Encounter: Payer: Self-pay | Admitting: Internal Medicine

## 2011-01-31 ENCOUNTER — Encounter: Payer: Self-pay | Admitting: *Deleted

## 2011-01-31 ENCOUNTER — Ambulatory Visit (INDEPENDENT_AMBULATORY_CARE_PROVIDER_SITE_OTHER): Payer: BC Managed Care – PPO | Admitting: Internal Medicine

## 2011-01-31 ENCOUNTER — Other Ambulatory Visit (INDEPENDENT_AMBULATORY_CARE_PROVIDER_SITE_OTHER): Payer: BC Managed Care – PPO

## 2011-01-31 VITALS — BP 150/88 | HR 64 | Ht 62.0 in | Wt 136.2 lb

## 2011-01-31 DIAGNOSIS — D869 Sarcoidosis, unspecified: Secondary | ICD-10-CM

## 2011-01-31 LAB — CBC WITH DIFFERENTIAL/PLATELET
Basophils Absolute: 0 10*3/uL (ref 0.0–0.1)
HCT: 36.9 % (ref 36.0–46.0)
Lymphocytes Relative: 54 % — ABNORMAL HIGH (ref 12.0–46.0)
Lymphs Abs: 3.9 10*3/uL (ref 0.7–4.0)
Monocytes Relative: 7.2 % (ref 3.0–12.0)
Neutrophils Relative %: 32.9 % — ABNORMAL LOW (ref 43.0–77.0)
Platelets: 311 10*3/uL (ref 150.0–400.0)
RDW: 14 % (ref 11.5–14.6)
WBC: 7.1 10*3/uL (ref 4.5–10.5)

## 2011-01-31 NOTE — Progress Notes (Signed)
01/31/11- 43 yoF former smoker with history of sarcoid. Followed at this office in 2008 with fibrocystic scarring. Originally diagnosed after abnormal chest x-ray for job physical. Bronchoscopy with biopsy. Treated with prednisone. [facility] with [facility]-acquired pneumonia treated with Avelox and prednisone, urinary infection treated with Bactrim. CT scan of the chest 12/17/2010 showed significant pulmonary scarring and bronchiectasis consistent with sarcoid. No obvious superimposed pneumonia. Stable adenopathy. She has finished her discharge meds. She still feels mild vertigo, positional, but back to baseline with shortness of breath cough and wheeze. Mostly exertional dyspnea on stairs or brisk walking with little change over several years. She denies fever, night sweats, adenopathy or rash. She was discharged from the hospital with oxygen at 2 L/Advanced, but she doubts that she needs it now. An albuterol rescue inhaler helps if she is walking briskly or out in the wind. Had flu vaccine November 2012 in the hospital and has history of pneumonia vaccine. Denies history of cardiac disease or allergy. TB skin test was negative years ago. No exposure.  ROS-see HPI Constitutional:   No-   weight loss, night sweats, fevers, chills, fatigue, lassitude. HEENT:   No-  headaches, difficulty swallowing, tooth/dental problems, sore throat,       No-  sneezing, itching, ear ache, nasal congestion, post nasal drip,  CV:  No-   chest pain, orthopnea, PND, swelling in lower extremities, anasarca,                                  dizziness, palpitations Resp: No-  acute shortness of breath with exertion or at rest.              No-   productive cough,  No non-productive cough,  No- coughing up of blood.              No-   change in color of mucus.  No- wheezing.   Skin: No-   rash or lesions. GI:  No-   heartburn, indigestion, abdominal pain, nausea, vomiting, diarrhea,                 change in  bowel habits, loss of appetite GU: No-   dysuria, change in color of urine, no urgency or frequency.  No- flank pain. MS:  No-   joint pain or swelling.  No- decreased range of motion.  No- back pain. Neuro-     nothing unusual Psych:  No- change in mood or affect. No depression or anxiety.  No memory loss.  OBJ General- Alert, Oriented, Affect-appropriate, Distress- none acute, trim Skin- rash-none, lesions- none, excoriation- none Lymphadenopathy- none Head- atraumatic            Eyes- Gross vision intact, PERRLA, conjunctivae ? Pale, clear secretions            Ears- Hearing, canals-normal            Nose- Clear, no-Septal dev, mucus, polyps, erosion, perforation             Throat- Mallampati II , mucosa clear , drainage- none, tonsils- atrophic Neck- flexible , trachea midline, no stridor , thyroid nl, carotid no bruit Chest - symmetrical excursion , unlabored           Heart/CV- RRR , no murmur , no gallop  , no rub, nl s1 s2                           -  JVD- none , edema- none, stasis changes- none, varices- none           Lung- clear to P&A, wheeze- none, cough- none , dullness-none, rub- none           Chest wall-  Abd- tender-no, distended-no, bowel sounds-present, HSM- no Br/ Gen/ Rectal- Not done, not indicated Extrem- cyanosis- none, clubbing, none, atrophy- none, strength- nl Neuro- grossly intact to observation

## 2011-01-31 NOTE — Patient Instructions (Signed)
Order- PFT, 6 MWT   Dx sarcoid             Lab- ACE level                    CBC w/ diff

## 2011-02-01 ENCOUNTER — Telehealth: Payer: Self-pay | Admitting: Internal Medicine

## 2011-02-01 ENCOUNTER — Encounter: Payer: Self-pay | Admitting: *Deleted

## 2011-02-01 LAB — ANGIOTENSIN CONVERTING ENZYME: Angiotensin-Converting Enzyme: 33 U/L (ref 8–52)

## 2011-02-01 NOTE — Telephone Encounter (Signed)
Patient needs the letter for returning to work to state that she may return to work at full duty and no restrictions. Per Florentina Addison, this okay and we can redo the letter for her. Letter given to the patient.

## 2011-02-02 NOTE — Assessment & Plan Note (Signed)
Shortness of breath with exertion is probably near her baseline now. We need objective measure and will schedule pulmonary function tests and 6 minute walk test as discussed. Return to work note for December 3 as discussed.

## 2011-02-05 NOTE — Progress Notes (Signed)
Quick Note:  ATC no voicemail to leave message-will need to try again tomorrow. ______

## 2011-02-22 NOTE — Progress Notes (Signed)
Quick Note:  Left message to call back with man at home number as patient was at work. ______

## 2011-02-27 ENCOUNTER — Telehealth: Payer: Self-pay | Admitting: Internal Medicine

## 2011-02-27 NOTE — Telephone Encounter (Signed)
I spoke with patient about results and she verbalized understanding and had no questions 

## 2011-04-04 ENCOUNTER — Ambulatory Visit (INDEPENDENT_AMBULATORY_CARE_PROVIDER_SITE_OTHER): Payer: BC Managed Care – PPO | Admitting: Internal Medicine

## 2011-04-04 ENCOUNTER — Encounter: Payer: Self-pay | Admitting: Internal Medicine

## 2011-04-04 VITALS — BP 128/78 | HR 69 | Ht 62.0 in | Wt 143.8 lb

## 2011-04-04 DIAGNOSIS — D869 Sarcoidosis, unspecified: Secondary | ICD-10-CM

## 2011-04-04 DIAGNOSIS — R0600 Dyspnea, unspecified: Secondary | ICD-10-CM

## 2011-04-04 DIAGNOSIS — R0609 Other forms of dyspnea: Secondary | ICD-10-CM

## 2011-04-04 DIAGNOSIS — J449 Chronic obstructive pulmonary disease, unspecified: Secondary | ICD-10-CM

## 2011-04-04 LAB — PULMONARY FUNCTION TEST

## 2011-04-04 NOTE — Progress Notes (Signed)
01/31/11- 39 yoF former smoker with history of sarcoid. Followed at this office in 2008 with fibrocystic scarring. Originally diagnosed after abnormal chest x-ray for job physical. Bronchoscopy with biopsy. Treated with prednisone. [facility]ized [facility] with community-acquired pneumonia treated with Avelox and prednisone, urinary infection treated with Bactrim. CT scan of the chest 12/17/2010 showed significant pulmonary scarring and bronchiectasis consistent with sarcoid. No obvious superimposed pneumonia. Stable adenopathy. She has finished her discharge meds. She still feels mild vertigo, positional, but back to baseline with shortness of breath cough and wheeze. Mostly exertional dyspnea on stairs or brisk walking with little change over several years. She denies fever, night sweats, adenopathy or rash. She was discharged from the hospital with oxygen at 2 L/Advanced, but she doubts that she needs it now. An albuterol rescue inhaler helps if she is walking briskly or out in the wind. Had flu vaccine November 2012 in the hospital and has history of pneumonia vaccine. Denies history of cardiac disease or allergy. TB skin test was negative years ago. No exposure.  04/04/11- 65 yoF former smoker, followed for hx sarcoid/ scarring, hx pneumonia Blowing nose more than blames minor cough whether related nasal drip. Denies fever, sore throat, sweat or nodes. Rarely needs her rescue inhaler. PFT: 04/04/2011-moderate obstructive airways disease with insignificant response to dilator diffusion severely reduced c/w emphysema. FEV1/FVC 0.52, DLCO 43%. 6 minut.e walk test: 04/04/2011 97%, dropping to 94% and a block, rebounded to 98% with 2 minutes rest. 312 m   ROS-see HPI Constitutional:   No-   weight loss, night sweats, fevers, chills, fatigue, lassitude. HEENT:   No-  headaches, difficulty swallowing, tooth/dental problems, sore throat,       +  sneezing, itching, ear ache, nasal congestion, post nasal  drip,  CV:  No-   chest pain, orthopnea, PND, swelling in lower extremities, anasarca, dizziness, palpitations Resp: + shortness of breath with exertion or at rest.              No-   productive cough,  + non-productive cough,  No- coughing up of blood.              No-   change in color of mucus.  No- wheezing.   Skin: No-   rash or lesions. GI:  No-   heartburn, indigestion, abdominal pain, nausea, vomiting, diarrhea,                 change in bowel habits, loss of appetite GU:  MS:  No-   joint pain or swelling.  No- decreased range of motion.  No- back pain. Neuro-     nothing unusual Psych:  No- change in mood or affect. No depression or anxiety.  No memory loss.  OBJ General- Alert, Oriented, Affect-appropriate, Distress- none acute, trim Skin- rash-none, lesions- none, excoriation- none Lymphadenopathy- none Head- atraumatic            Eyes- Gross vision intact, PERRLA, conjunctivae ? Pale, clear secretions            Ears- Hearing, canals-normal            Nose- Clear, sniffing, no-Septal dev, mucus, polyps, erosion, perforation             Throat- Mallampati II , mucosa clear , drainage- none, tonsils- atrophic Neck- flexible , trachea midline, no stridor , thyroid nl, carotid no bruit Chest - symmetrical excursion , unlabored           Heart/CV- RRR ,  no murmur , no gallop  , no rub, nl s1 s2                           - JVD- none , edema- none, stasis changes- none, varices- none           Lung- clear to P&A, wheeze- none, cough- none , dullness-none, rub- none           Chest wall-  Abd- Br/ Gen/ Rectal- Not done, not indicated Extrem- cyanosis- none, clubbing, none, atrophy- none, strength- nl Neuro- grossly intact to observation

## 2011-04-04 NOTE — Patient Instructions (Signed)
Sample [place]   1 puff, twice every day till you use it up.  See if it makes a real difference in your shortness of breath while walking.

## 2011-04-04 NOTE — Progress Notes (Signed)
PFT done today. 

## 2011-04-05 DIAGNOSIS — J449 Chronic obstructive pulmonary disease, unspecified: Secondary | ICD-10-CM | POA: Insufficient documentation

## 2011-04-05 NOTE — Assessment & Plan Note (Signed)
Moderate COPD

## 2011-04-05 NOTE — Assessment & Plan Note (Signed)
In clinical remission 

## 2011-04-05 NOTE — Assessment & Plan Note (Signed)
Moderate obstructive airways disease most consistent with emphysema/COPD. This is not a restrictive pattern we would consider most typical of sarcoid. I don't expect dramatic response to medication but we will try sample [place]. Walking for exercise/ stamina

## 2011-05-15 NOTE — Progress Notes (Signed)
04/04/11- Documentation for 6 minute walk test

## 2012-07-10 ENCOUNTER — Encounter (HOSPITAL_COMMUNITY): Payer: Self-pay | Admitting: *Deleted

## 2012-07-10 ENCOUNTER — Emergency Department (INDEPENDENT_AMBULATORY_CARE_PROVIDER_SITE_OTHER)
Admission: EM | Admit: 2012-07-10 | Discharge: 2012-07-10 | Disposition: A | Discharge status: Home / Self Care | Payer: BC Managed Care – PPO | Source: Home / Self Care | Attending: Family Medicine | Admitting: Family Medicine

## 2012-07-10 DIAGNOSIS — K5289 Other specified noninfective gastroenteritis and colitis: Secondary | ICD-10-CM

## 2012-07-10 DIAGNOSIS — K2970 Gastritis, unspecified, without bleeding: Secondary | ICD-10-CM

## 2012-07-10 DIAGNOSIS — K2990 Gastroduodenitis, unspecified, without bleeding: Secondary | ICD-10-CM

## 2012-07-10 DIAGNOSIS — K529 Noninfective gastroenteritis and colitis, unspecified: Secondary | ICD-10-CM

## 2012-07-10 MED ORDER — ONDANSETRON 4 MG PO TBDP: 4.0000 mg | ORAL_TABLET | Freq: Three times a day (TID) | ORAL | Status: DC | PRN

## 2012-07-10 MED ORDER — SUCRALFATE 1 GM/10ML PO SUSP: 1.0000 g | Freq: Three times a day (TID) | ORAL | Status: DC

## 2012-07-10 MED ORDER — OMEPRAZOLE 20 MG PO CPDR: 20.0000 mg | DELAYED_RELEASE_CAPSULE | Freq: Every day | ORAL | Status: DC

## 2012-07-10 MED ORDER — HYDROCODONE-ACETAMINOPHEN 5-325 MG PO TABS: 2.0000 | ORAL_TABLET | ORAL | Status: DC | PRN

## 2012-07-10 MED ORDER — LOPERAMIDE HCL 2 MG PO CAPS: 2.0000 mg | ORAL_CAPSULE | Freq: Four times a day (QID) | ORAL | Status: DC | PRN

## 2012-07-10 NOTE — ED Provider Notes (Signed)
History     CSN: [id number]  Arrival date & time 07/10/12  1019   First MD Initiated Contact with Patient 07/10/12 1038      Chief Complaint  Patient presents with  . Nausea    (Consider location/radiation/quality/duration/timing/severity/associated sxs/prior treatment) HPI Comments: 67 year old female with history of sarcoidosis and hypertension. Here complaining of nausea vomiting and diarrhea for 4 days. Patient believes she ingested a [place] food that trigger her symptoms. Symptoms associated with abdominal cramping before bowel movements. Reports stools are runny/liquid brown with no blood. Denies melena. Having about 3-4 bowel movements per day. Had last one this morning. Also having nonbloody emesis. Last time Tuesday but continues to feel nauseous especially after eating. Reports epigastric pain after meals and decreased appetite. Tolerating fluids and solids today. Denies dizziness or headache. feels tired in general. No fever or chills. No rash. No respiratory symptoms. 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    Past Medical History  Diagnosis Date  . Hypertension   . History of nephrolithiasis   . Sarcoidosis of skin     Past Surgical History  Procedure Laterality Date  . Cholecystectomy    . Abdominal hysterectomy      Family History  Problem Relation Age of Onset  . Hypertension Other     History  Substance Use Topics  . Smoking status: Former Smoker -- 0.50 packs/day for 20 years    Types: Cigarettes    Quit date: 03/04/1990  . Smokeless tobacco: Not on file     Comment: Regukar exercise - Yes  . Alcohol Use: No    OB History   Grav Para Term Preterm Abortions TAB SAB Ect Mult Living                  Review of Systems  Constitutional: Negative for fever and chills.  HENT: Negative for congestion and sore throat.   Respiratory: Negative for shortness of breath and wheezing.   Cardiovascular: Negative for  chest pain.  Gastrointestinal: Positive for nausea, vomiting and diarrhea.  Endocrine: Negative for cold intolerance, heat intolerance, polydipsia, polyphagia and polyuria.  Genitourinary: Negative for dysuria, frequency, hematuria and flank pain.  Skin: Negative for rash.  Neurological: Negative for dizziness and headaches.  All other systems reviewed and are negative.    Allergies  Review of patient's allergies indicates no known allergies.  Home Medications   Current Outpatient Rx  Name  Route  Sig  Dispense  Refill  . albuterol (PROVENTIL HFA;VENTOLIN HFA) 108 (90 BASE) MCG/ACT inhaler   Inhalation   Inhale 1 puff into the lungs every 4 (four) hours as needed. For shortness of breath         . ferrous sulfate 325 (65 FE) MG tablet   Oral   Take 325 mg by mouth 2 (two) times daily.           Marland Kitchen guaiFENesin (MUCINEX) 600 MG 12 hr tablet   Oral   Take 1,200 mg by mouth 2 (two) times daily.           Marland Kitchen HYDROcodone-acetaminophen (NORCO/VICODIN) 5-325 MG per tablet   Oral   Take 2 tablets by mouth every 4 (four) hours as needed for pain.   6 tablet   0   . loperamide (IMODIUM) 2 MG capsule   Oral   Take 1 capsule (2 mg total) by mouth 4 (four) times daily as needed for diarrhea or loose stools.   12  capsule   0   . omeprazole (PRILOSEC) 20 MG capsule   Oral   Take 1 capsule (20 mg total) by mouth daily.   30 capsule   0   . ondansetron (ZOFRAN-ODT) 4 MG disintegrating tablet   Oral   Take 1 tablet (4 mg total) by mouth every 8 (eight) hours as needed for nausea.   10 tablet   0   . sucralfate (CARAFATE) 1 GM/10ML suspension   Oral   Take 10 mLs (1 g total) by mouth 4 (four) times daily -  with meals and at bedtime. Take 30 min prior meal and at bed time   420 mL   0   . zolpidem (AMBIEN) 5 MG tablet   Oral   Take 5 mg by mouth at bedtime as needed.             BP 147/64  Pulse 78  Temp(Src) 98.8 F (37.1 C) (Oral)  Resp 18  SpO2  98%  Physical Exam  Nursing note and vitals reviewed. Constitutional: She is oriented to person, place, and time. She appears well-developed and well-nourished. No distress.  HENT:  Head: Normocephalic and atraumatic.  Mouth/Throat: Oropharynx is clear and moist. No oropharyngeal exudate.  Eyes: Conjunctivae are normal. No scleral icterus.  Neck: Neck supple. No thyromegaly present.  Cardiovascular: Normal rate, regular rhythm and normal heart sounds.   Pulmonary/Chest: Effort normal and breath sounds normal. No respiratory distress. She has no wheezes. She has no rales. She exhibits no tenderness.  Abdominal: Soft. Bowel sounds are normal. She exhibits no distension and no mass. There is no tenderness. There is no rebound and no guarding.  Lymphadenopathy:    She has no cervical adenopathy.  Neurological: She is alert and oriented to person, place, and time.  Skin: No rash noted. She is not diaphoretic.    ED Course  Procedures (including critical care time)  Labs Reviewed - No data to display No results found.   1. Gastritis   2. Gastroenteritis       MDM  Normal vital signs. Clinically well. Prescribed ondansetron, Imodium, Prilosec, and Carafate.  Supportive care and red flags that should prompt patient return to medical attention discussed with patient and provided in writing.       Sharin Grave, MD 07/10/12 1113

## 2012-07-10 NOTE — ED Notes (Signed)
Pt  Reports  Symptoms  Of  Nausea    And  Diarrhea   As  Well  As  Feeling  Weak     X  4  Days   Pt  Had  Some        Vomiting  2  Days  Ago -   Pt  Reports  Symptoms  began after  Eating  Chinese  Foods         At   tis  Time   Pt  Is  Not  Vomiting  She  Is  Sitting  Upright on the  Exam table  In no acute  Distress

## 2012-08-23 ENCOUNTER — Emergency Department (HOSPITAL_COMMUNITY)
Admission: EM | Admit: 2012-08-23 | Discharge: 2012-08-23 | Disposition: A | Discharge status: Home / Self Care | Payer: BC Managed Care – PPO | Source: Home / Self Care | Attending: Family Medicine | Admitting: Family Medicine

## 2012-08-23 ENCOUNTER — Encounter (HOSPITAL_COMMUNITY): Payer: Self-pay | Admitting: Emergency Medicine

## 2012-08-23 DIAGNOSIS — S39012A Strain of muscle, fascia and tendon of lower back, initial encounter: Secondary | ICD-10-CM

## 2012-08-23 DIAGNOSIS — S335XXA Sprain of ligaments of lumbar spine, initial encounter: Secondary | ICD-10-CM

## 2012-08-23 MED ORDER — ONDANSETRON 4 MG PO TBDP
ORAL_TABLET | ORAL | Status: AC
  Filled 2012-08-23: qty 1

## 2012-08-23 MED ORDER — ONDANSETRON HCL 4 MG/2ML IJ SOLN
INTRAMUSCULAR | Status: AC
  Filled 2012-08-23: qty 2

## 2012-08-23 MED ORDER — HYDROMORPHONE HCL PF 1 MG/ML IJ SOLN
INTRAMUSCULAR | Status: AC
  Filled 2012-08-23: qty 1

## 2012-08-23 MED ORDER — HYDROMORPHONE HCL PF 1 MG/ML IJ SOLN
1.0000 mg | Freq: Once | INTRAMUSCULAR | Status: AC
  Administered 2012-08-23: 1 mg via INTRAMUSCULAR

## 2012-08-23 MED ORDER — KETOROLAC TROMETHAMINE 10 MG PO TABS: 10.0000 mg | ORAL_TABLET | Freq: Four times a day (QID) | ORAL | Status: DC | PRN

## 2012-08-23 MED ORDER — CYCLOBENZAPRINE HCL 5 MG PO TABS: 5.0000 mg | ORAL_TABLET | Freq: Three times a day (TID) | ORAL | Status: DC | PRN

## 2012-08-23 MED ORDER — ONDANSETRON 4 MG PO TBDP
4.0000 mg | ORAL_TABLET | Freq: Once | ORAL | Status: AC
  Administered 2012-08-23: 4 mg via ORAL

## 2012-08-23 NOTE — ED Provider Notes (Addendum)
History     CSN: [id number]  Arrival date & time 08/23/12  1548   None     Chief Complaint  Patient presents with  . Back Pain    (Consider location/radiation/quality/duration/timing/severity/associated sxs/prior treatment) Patient is a 67 y.o. female presenting with back pain. The history is provided by the patient and the spouse.  Back Pain Location:  Lumbar spine Quality:  Shooting Radiates to:  R thigh Pain severity:  Moderate Duration:  2 days Timing:  Constant Progression:  Unchanged Chronicity:  New Context: twisting   Associated symptoms: no abdominal pain, no chest pain, no fever, no leg pain, no numbness, no paresthesias, no pelvic pain, no tingling and no weakness     Past Medical History  Diagnosis Date  . Hypertension   . History of nephrolithiasis   . Sarcoidosis of skin     Past Surgical History  Procedure Laterality Date  . Cholecystectomy    . Abdominal hysterectomy      Family History  Problem Relation Age of Onset  . Hypertension Other     History  Substance Use Topics  . Smoking status: Former Smoker -- 0.50 packs/day for 20 years    Types: Cigarettes    Quit date: 03/04/1990  . Smokeless tobacco: Not on file     Comment: Regukar exercise - Yes  . Alcohol Use: No    OB History   Grav Para Term Preterm Abortions TAB SAB Ect Mult Living                  Review of Systems  Constitutional: Negative.  Negative for fever.  Cardiovascular: Negative for chest pain.  Gastrointestinal: Negative for abdominal pain.  Genitourinary: Negative for pelvic pain.  Musculoskeletal: Positive for back pain and gait problem. Negative for myalgias and joint swelling.  Skin: Negative.   Neurological: Negative for tingling, weakness, numbness and paresthesias.    Allergies  Review of patient's allergies indicates no known allergies.  Home Medications   Current Outpatient Rx  Name  Route  Sig  Dispense  Refill  . albuterol (PROVENTIL  HFA;VENTOLIN HFA) 108 (90 BASE) MCG/ACT inhaler   Inhalation   Inhale 1 puff into the lungs every 4 (four) hours as needed. For shortness of breath         . cyclobenzaprine (FLEXERIL) 5 MG tablet   Oral   Take 1 tablet (5 mg total) by mouth 3 (three) times daily as needed for muscle spasms.   30 tablet   0   . ferrous sulfate 325 (65 FE) MG tablet   Oral   Take 325 mg by mouth 2 (two) times daily.           Marland Kitchen guaiFENesin (MUCINEX) 600 MG 12 hr tablet   Oral   Take 1,200 mg by mouth 2 (two) times daily.           Marland Kitchen HYDROcodone-acetaminophen (NORCO/VICODIN) 5-325 MG per tablet   Oral   Take 2 tablets by mouth every 4 (four) hours as needed for pain.   6 tablet   0   . ketorolac (TORADOL) 10 MG tablet   Oral   Take 1 tablet (10 mg total) by mouth every 6 (six) hours as needed for pain.   20 tablet   0   . loperamide (IMODIUM) 2 MG capsule   Oral   Take 1 capsule (2 mg total) by mouth 4 (four) times daily as needed for diarrhea or loose stools.  12 capsule   0   . omeprazole (PRILOSEC) 20 MG capsule   Oral   Take 1 capsule (20 mg total) by mouth daily.   30 capsule   0   . ondansetron (ZOFRAN-ODT) 4 MG disintegrating tablet   Oral   Take 1 tablet (4 mg total) by mouth every 8 (eight) hours as needed for nausea.   10 tablet   0   . sucralfate (CARAFATE) 1 GM/10ML suspension   Oral   Take 10 mLs (1 g total) by mouth 4 (four) times daily -  with meals and at bedtime. Take 30 min prior meal and at bed time   420 mL   0   . zolpidem (AMBIEN) 5 MG tablet   Oral   Take 5 mg by mouth at bedtime as needed.             BP 124/70  Pulse 86  Temp(Src) 98.2 F (36.8 C) (Oral)  Resp 22  SpO2 100%  Physical Exam  Nursing note and vitals reviewed. Constitutional: She is oriented to person, place, and time. She appears well-developed and well-nourished.  Musculoskeletal: She exhibits tenderness.       Lumbar back: She exhibits decreased range of motion,  tenderness, pain and spasm. She exhibits normal pulse.  Neurological: She is alert and oriented to person, place, and time.  Skin: Skin is warm and dry.    ED Course  Procedures (including critical care time)  Labs Reviewed - No data to display No results found.   1. Low back strain, initial encounter       MDM         Linna Hoff, MD 08/23/12 1610  Linna Hoff, MD 08/23/12 [phone]

## 2012-08-23 NOTE — ED Notes (Signed)
Pt here with sudden constant back spasm that started x 2 dys ago. Pain radiating to lower and buttocks unrelieved by otc meds. Denies numb/tingling.

## 2013-08-23 ENCOUNTER — Ambulatory Visit (INDEPENDENT_AMBULATORY_CARE_PROVIDER_SITE_OTHER): Payer: Medicare Other | Admitting: Adult Health

## 2013-08-23 ENCOUNTER — Telehealth: Payer: Self-pay | Admitting: Internal Medicine

## 2013-08-23 ENCOUNTER — Encounter (INDEPENDENT_AMBULATORY_CARE_PROVIDER_SITE_OTHER): Payer: Self-pay

## 2013-08-23 ENCOUNTER — Ambulatory Visit (INDEPENDENT_AMBULATORY_CARE_PROVIDER_SITE_OTHER)
Admission: RE | Admit: 2013-08-23 | Discharge: 2013-08-23 | Disposition: A | Discharge status: Home / Self Care | Payer: Medicare Other | Source: Ambulatory Visit | Attending: Adult Health | Admitting: Adult Health

## 2013-08-23 ENCOUNTER — Encounter: Payer: Self-pay | Admitting: Adult Health

## 2013-08-23 VITALS — BP 114/66 | HR 69 | Ht 62.0 in | Wt 141.0 lb

## 2013-08-23 DIAGNOSIS — D869 Sarcoidosis, unspecified: Secondary | ICD-10-CM | POA: Diagnosis not present

## 2013-08-23 DIAGNOSIS — J8410 Pulmonary fibrosis, unspecified: Secondary | ICD-10-CM | POA: Diagnosis not present

## 2013-08-23 DIAGNOSIS — J449 Chronic obstructive pulmonary disease, unspecified: Secondary | ICD-10-CM

## 2013-08-23 DIAGNOSIS — J441 Chronic obstructive pulmonary disease with (acute) exacerbation: Secondary | ICD-10-CM

## 2013-08-23 DIAGNOSIS — J479 Bronchiectasis, uncomplicated: Secondary | ICD-10-CM | POA: Diagnosis not present

## 2013-08-23 MED ORDER — AMOXICILLIN-POT CLAVULANATE 875-125 MG PO TABS: 1.0000 | ORAL_TABLET | Freq: Two times a day (BID) | ORAL | Status: AC

## 2013-08-23 NOTE — Patient Instructions (Addendum)
Hold Aspirin for now  Mucinex DM Twice daily  As needed  Cough/congestion  Augmentin to have on hold if cough persists or develops with discolored mucus.  If blood tinged mucus does not stop or gets worse will need to be seen sooner.  Please contact office for sooner follow up if symptoms do not improve or worsen or seek emergency care  Follow up Dr. Maple HudsonYoung  In 4-6 weeks and As needed

## 2013-08-23 NOTE — Telephone Encounter (Signed)
Work-in being arranged with CXR for the NP today

## 2013-08-23 NOTE — Telephone Encounter (Signed)
Per Dr. Maple HudsonYoung: pt needs OV with TP with cxr prior.  Called, spoke with pt.  We have scheduled her to see TP today at 2:45 pm.   She is to arrive at 2 pm for cxr first and is aware to go to the basement for this. Pt to come to our office after having cxr. Pt verbalized understanding of instructions and voiced no further questions or concerns at this time. CXR order placed.

## 2013-08-23 NOTE — Telephone Encounter (Signed)
Spoke with the pt  She states that yesterday she started coughing up bright red blood She states that she can feel it accumulating in her throat and when she coughs she brings up "a handful of blood"  She reports no changes in her breathing or any other co's  There are no openings in the schedule Please advise thanks! No Known Allergies Current Outpatient Prescriptions on File Prior to Visit  Medication Sig Dispense Refill  . albuterol (PROVENTIL HFA;VENTOLIN HFA) 108 (90 BASE) MCG/ACT inhaler Inhale 1 puff into the lungs every 4 (four) hours as needed. For shortness of breath      . cyclobenzaprine (FLEXERIL) 5 MG tablet Take 1 tablet (5 mg total) by mouth 3 (three) times daily as needed for muscle spasms.  30 tablet  0  . ferrous sulfate 325 (65 FE) MG tablet Take 325 mg by mouth 2 (two) times daily.        Marland Kitchen. guaiFENesin (MUCINEX) 600 MG 12 hr tablet Take 1,200 mg by mouth 2 (two) times daily.        Marland Kitchen. HYDROcodone-acetaminophen (NORCO/VICODIN) 5-325 MG per tablet Take 2 tablets by mouth every 4 (four) hours as needed for pain.  6 tablet  0  . ketorolac (TORADOL) 10 MG tablet Take 1 tablet (10 mg total) by mouth every 6 (six) hours as needed for pain.  20 tablet  0  . loperamide (IMODIUM) 2 MG capsule Take 1 capsule (2 mg total) by mouth 4 (four) times daily as needed for diarrhea or loose stools.  12 capsule  0  . omeprazole (PRILOSEC) 20 MG capsule Take 1 capsule (20 mg total) by mouth daily.  30 capsule  0  . ondansetron (ZOFRAN-ODT) 4 MG disintegrating tablet Take 1 tablet (4 mg total) by mouth every 8 (eight) hours as needed for nausea.  10 tablet  0  . sucralfate (CARAFATE) 1 GM/10ML suspension Take 10 mLs (1 g total) by mouth 4 (four) times daily -  with meals and at bedtime. Take 30 min prior meal and at bed time  420 mL  0  . zolpidem (AMBIEN) 5 MG tablet Take 5 mg by mouth at bedtime as needed.         No current facility-administered medications on file prior to visit.

## 2013-08-31 NOTE — Assessment & Plan Note (Addendum)
Mild flare w/ underlying Sarcoid w/ bronchiectatic changes  CXR w/ no change in  Chronic changes of bronchiectasis /sarcoid/copd  Case reviewed in detail with Dr. Maple HudsonYoung    Plan  Hold Aspirin for now  Mucinex DM Twice daily  As needed  Cough/congestion  Augmentin to have on hold if cough persists or develops with discolored mucus.  If blood tinged mucus does not stop or gets worse will need to be seen sooner.  Please contact office for sooner follow up if symptoms do not improve or worsen or seek emergency care  Follow up Dr. Maple HudsonYoung  In 4-6 weeks and As needed

## 2013-08-31 NOTE — Progress Notes (Signed)
01/31/11- 89 yoF former smoker with history of sarcoid. Followed at this office in 2008 with fibrocystic scarring. Originally diagnosed after abnormal chest x-ray for job physical. Bronchoscopy with biopsy. Treated with prednisone. [facility] with community-acquired pneumonia treated with Avelox and prednisone, urinary infection treated with Bactrim. CT scan of the chest 12/17/2010 showed significant pulmonary scarring and bronchiectasis consistent with sarcoid. No obvious superimposed pneumonia. Stable adenopathy. She has finished her discharge meds. She still feels mild vertigo, positional, but back to baseline with shortness of breath cough and wheeze. Mostly exertional dyspnea on stairs or brisk walking with little change over several years. She denies fever, night sweats, adenopathy or rash. She was discharged from the hospital with oxygen at 2 L/Advanced, but she doubts that she needs it now. An albuterol rescue inhaler helps if she is walking briskly or out in the wind. Had flu vaccine November 2012 in the hospital and has history of pneumonia vaccine. Denies history of cardiac disease or allergy. TB skin test was negative years ago. No exposure.  04/04/11- 65 yoF former smoker, followed for hx sarcoid/ scarring, hx pneumonia Blowing nose more than blames minor cough whether related nasal drip. Denies fever, sore throat, sweat or nodes. Rarely needs her rescue inhaler. PFT: 04/04/2011-moderate obstructive airways disease with insignificant response to dilator diffusion severely reduced c/w emphysema. FEV1/FVC 0.52, DLCO 43%. 6 minut.e walk test: 04/04/2011 97%, dropping to 94% and a block, rebounded to 98% with 2 minutes rest. 312 m  08/23/13 Acute OV  Pt presents for an acute office visit.  Complains of increased cough for last few days, had blood tinged mucus few times over last 3 days .  Dark blood mostly, had one episode of bright red blood yesterday happened. Less today.  Usually  about a 1/2-1 tsp at a time w/ mixed mucus /drainage.  No fever, chest pain , orthopnea , edema or calf pain.  Takes aspirin daily .  CXR today with no acute findings. Chronic changes noted.  Has not been seen in >2 years , was started on tudorza last ov but did not like it.  Uses rescues SABA but rarely needs it.  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  CY pt here for hemoptysis.  Started coughing up deep dark red blood Saturday night, up to a tablespoon full at a time.  Pt reports no rash.  We discussed need to keep follow up ov.     ROS-see HPI Constitutional:   No-   weight loss, night sweats, fevers, chills, fatigue, lassitude. HEENT:   No-  headaches, difficulty swallowing, tooth/dental problems, sore throat,       +  nasal congestion, post nasal drip,  CV:  No-   chest pain, orthopnea, PND, swelling in lower extremities, anasarca, dizziness, palpitations Resp: No shortness of breath with exertion or at rest.              No-   productive cough,  + non-productive cough,  + coughing up of blood.              No-   change in color of mucus.  No- wheezing.   Skin: No-   rash or lesions. GI:  No-   heartburn, indigestion, abdominal pain, nausea, vomiting, diarrhea,                 change in bowel habits, loss of appetite GU:  MS:  No-   joint pain or swelling.  No- decreased  range of motion.  No- back pain. Neuro-     nothing unusual Psych:  No- change in mood or affect. No depression or anxiety.  No memory loss.  OBJ General- Alert, Oriented, Affect-appropriate, Distress- none acute, trim Skin- rash-none, lesions- none, excoriation- none Lymphadenopathy- none Head- atraumatic            Eyes- Gross vision intact, PERRLA, conjunctivae ? Pale, clear secretions            Ears- Hearing, canals-normal            Nose- Clear, sniffing, no-Septal dev, mucus, polyps, erosion, perforation             Throat- Mallampati II , mucosa clear , drainage- none, tonsils- atrophic Neck-  flexible , trachea midline, no stridor , thyroid nl, carotid no bruit Chest - symmetrical excursion , unlabored           Heart/CV- RRR , no murmur , no gallop  , no rub, nl s1 s2                           - JVD- none , edema- none, stasis changes- none, varices- none           Lung- clear to P&A, wheeze- none, cough- none , dullness-none, rub- none           Chest wall-  Abd- Br/ Gen/ Rectal- Not done, not indicated Extrem- cyanosis- none, clubbing, none, atrophy- none, strength- nl Neuro- grossly intact to observation

## 2013-10-12 ENCOUNTER — Encounter: Payer: Self-pay | Admitting: Internal Medicine

## 2013-10-12 ENCOUNTER — Ambulatory Visit (INDEPENDENT_AMBULATORY_CARE_PROVIDER_SITE_OTHER): Payer: Medicare Other | Admitting: Internal Medicine

## 2013-10-12 VITALS — BP 140/82 | HR 62 | Ht 62.0 in | Wt 137.0 lb

## 2013-10-12 DIAGNOSIS — D869 Sarcoidosis, unspecified: Secondary | ICD-10-CM | POA: Diagnosis not present

## 2013-10-12 DIAGNOSIS — J441 Chronic obstructive pulmonary disease with (acute) exacerbation: Secondary | ICD-10-CM

## 2013-10-12 MED ORDER — METHYLPREDNISOLONE ACETATE 80 MG/ML IJ SUSP
80.0000 mg | Freq: Once | INTRAMUSCULAR | Status: AC
  Administered 2013-10-12: 80 mg via INTRAMUSCULAR

## 2013-10-12 NOTE — Progress Notes (Signed)
01/31/11- 84 yoF former smoker with history of sarcoid. Followed at this office in 2008 with fibrocystic scarring. Originally diagnosed after abnormal chest x-ray for job physical. Bronchoscopy with biopsy. Treated with prednisone. Hospitalized [facility] with community-acquired pneumonia treated with Avelox and prednisone, urinary infection treated with Bactrim. CT scan of the chest 12/17/2010 showed significant pulmonary scarring and bronchiectasis consistent with sarcoid. No obvious superimposed pneumonia. Stable adenopathy. She has finished her discharge meds. She still feels mild vertigo, positional, but back to baseline with shortness of breath cough and wheeze. Mostly exertional dyspnea on stairs or brisk walking with little change over several years. She denies fever, night sweats, adenopathy or rash. She was discharged from the hospital with oxygen at 2 L/Advanced, but she doubts that she needs it now. An albuterol rescue inhaler helps if she is walking briskly or out in the wind. Had flu vaccine November 2012 in the hospital and has history of pneumonia vaccine. Denies history of cardiac disease or allergy. TB skin test was negative years ago. No exposure.  04/04/11- 65 yoF former smoker, followed for hx sarcoid/ scarring, hx pneumonia Blowing nose more than blames minor cough whether related nasal drip. Denies fever, sore throat, sweat or nodes. Rarely needs her rescue inhaler. PFT: 04/04/2011-moderate obstructive airways disease with insignificant response to dilator diffusion severely reduced c/w emphysema. FEV1/FVC 0.52, DLCO 43%. 6 minut.e walk test: 04/04/2011 97%, dropping to 94% and a block, rebounded to 98% with 2 minutes rest. 312 m  08/23/13 Acute OV  Pt presents for an acute office visit.  Complains of increased cough for last few days, had blood tinged mucus few times over last 3 days .  Dark blood mostly, had one episode of bright red blood yesterday happened. Less today.  Usually  about a 1/2-1 tsp at a time w/ mixed mucus /drainage.  No fever, chest pain , orthopnea , edema or calf pain.  Takes aspirin daily .  CXR today with no acute findings. Chronic changes noted.  Has not been seen in >2 years , was started on tudorza last ov but did not like it.  Uses rescues SABA but rarely needs it.  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  CY pt here for hemoptysis.  Started coughing up deep dark red blood Saturday night, up to a tablespoon full at a time.  Pt reports no rash.  We discussed need to keep follow up ov.   10/12/13- 68 yoF former smoker, followed for hx sarcoid/ scarring,COPD,  hx pneumonia FOLLOWS FOR: Pt last seen by CY on 03/2011, pt recently seen by TP for an acute visit on 08/23/2013. Pt states her breathing has slightly improved since seeing TP. Pt states she has good days and bad days d/t the weather. Pt c/o constant pain in right hip x 2 weeks. Pt c/o DOE and mild dry cough. Pt denies CP/tightness.  CXR 08/23/13 MPRESSION:  Stable severe chronic changes with fibrosis and bronchiectasis in  the upper lobes. Findings compatible with sarcoidosis.  Increasing left apical pleural thickening, likely scarring.  Electronically Signed  By: Charlett Nose M.D.  On: 08/23/2013 14:02  ROS-see HPI Constitutional:   No-   weight loss, night sweats, fevers, chills, fatigue, lassitude. HEENT:   No-  headaches, difficulty swallowing, tooth/dental problems, sore throat,       +  nasal congestion, post nasal drip,  CV:  No-   chest pain, orthopnea, PND, swelling in lower extremities, anasarca, dizziness, palpitations Resp: No shortness of  breath with exertion or at rest.              No-   productive cough,  + non-productive cough,  + coughing up of blood.              No-   change in color of mucus.  No- wheezing.   Skin: No-   rash or lesions. GI:  No-   heartburn, indigestion, abdominal pain, nausea, vomiting, diarrhea,                 change in bowel habits, loss of  appetite GU:  MS:  No-   joint pain or swelling.  No- decreased range of motion.  No- back pain. Neuro-     nothing unusual Psych:  No- change in mood or affect. No depression or anxiety.  No memory loss.  OBJ General- Alert, Oriented, Affect-appropriate, Distress- none acute, trim Skin- rash-none, lesions- none, excoriation- none Lymphadenopathy- none Head- atraumatic            Eyes- Gross vision intact, PERRLA, conjunctivae ? Pale, clear secretions            Ears- Hearing, canals-normal            Nose- Clear, sniffing, no-Septal dev, mucus, polyps, erosion, perforation             Throat- Mallampati II , mucosa clear , drainage- none, tonsils- atrophic Neck- flexible , trachea midline, no stridor , thyroid nl, carotid no bruit Chest - symmetrical excursion , unlabored           Heart/CV- RRR , no murmur , no gallop  , no rub, nl s1 s2                           - JVD- none , edema- none, stasis changes- none, varices- none           Lung- clear to P&A, wheeze- none, cough- none , dullness-none, rub- none           Chest wall-  Abd- Br/ Gen/ Rectal- Not done, not indicated Extrem- cyanosis- none, clubbing, none, atrophy- none, strength- nl Neuro- grossly intact to observation

## 2013-10-12 NOTE — Patient Instructions (Addendum)
Depo 80  We will see you in a year unless you need us sooner for problems with your chest or breathing  You should see your primary doctor if your hip keeps hurting

## 2014-03-06 NOTE — Assessment & Plan Note (Signed)
Obstructive and restrictive defects related to former smoking/COPD component but also to scarring from sarcoid.

## 2014-03-06 NOTE — Assessment & Plan Note (Signed)
Chest x-ray continues to show severe scarring and fibrosis consistent with old sarcoid. She has resolved hemoptysis after an acute bronchitis in June. She asked for a Depo-Medrol injection partly in hopes it would help her hip arthritis pain until she could see her primary physician Plan-Depo-Medrol with steroid talk

## 2014-04-20 ENCOUNTER — Telehealth: Payer: Self-pay | Admitting: Internal Medicine

## 2014-04-20 NOTE — Telephone Encounter (Signed)
Attempted to call pt. No answer, no option to leave a voicemail. Pt needs to call PCP about this issue.

## 2014-04-20 NOTE — Telephone Encounter (Signed)
Pt request to reestablish with Dr. Yetta BarreJones. Pt stated her hand feels like sand paper and cold, so Dr. Maple HudsonYoung recommend for to see her PCP. Last ov with 01/2011.

## 2014-04-21 NOTE — Telephone Encounter (Signed)
ok 

## 2014-04-21 NOTE — Telephone Encounter (Signed)
Attempted to call pt again. No answer, no option to leave a message.

## 2014-04-21 NOTE — Telephone Encounter (Signed)
Can not leave vm, will continue to try

## 2014-04-22 NOTE — Telephone Encounter (Signed)
ATC pt. Line kept ringing without VM. WCB.  Called work number and was told by receptionist that she is unaware if pt works there.

## 2014-04-25 NOTE — Telephone Encounter (Signed)
ATC pt. Line rang several times, no voicemail Per chart, pt has contacted PCP to re-establish care with them and they have been unable to reach her.  No alternative number on file to call.  WCB

## 2014-04-26 NOTE — Telephone Encounter (Signed)
Tired to called pt so many times, can not leave vm,phone keep on ringing.

## 2014-04-26 NOTE — Telephone Encounter (Signed)
atc pt, no answer and no vm.  Letter typed and sent to patient per triage protocol.    Message closed.

## 2014-05-18 ENCOUNTER — Telehealth: Payer: Self-pay

## 2014-05-18 NOTE — Telephone Encounter (Signed)
No answer, no way to leave message about flu shot info

## 2014-08-30 ENCOUNTER — Encounter: Payer: Self-pay | Admitting: Internal Medicine

## 2014-08-30 ENCOUNTER — Telehealth: Payer: Self-pay | Admitting: *Deleted

## 2014-08-30 ENCOUNTER — Ambulatory Visit (INDEPENDENT_AMBULATORY_CARE_PROVIDER_SITE_OTHER): Payer: Commercial Managed Care - HMO | Admitting: Internal Medicine

## 2014-08-30 ENCOUNTER — Other Ambulatory Visit (INDEPENDENT_AMBULATORY_CARE_PROVIDER_SITE_OTHER): Payer: Commercial Managed Care - HMO

## 2014-08-30 ENCOUNTER — Ambulatory Visit (INDEPENDENT_AMBULATORY_CARE_PROVIDER_SITE_OTHER)
Admission: RE | Admit: 2014-08-30 | Discharge: 2014-08-30 | Disposition: A | Discharge status: Home / Self Care | Payer: Commercial Managed Care - HMO | Source: Ambulatory Visit | Attending: Internal Medicine | Admitting: Internal Medicine

## 2014-08-30 VITALS — BP 118/76 | HR 63 | Ht 62.0 in | Wt 131.4 lb

## 2014-08-30 DIAGNOSIS — D869 Sarcoidosis, unspecified: Secondary | ICD-10-CM | POA: Diagnosis not present

## 2014-08-30 DIAGNOSIS — J984 Other disorders of lung: Secondary | ICD-10-CM | POA: Diagnosis not present

## 2014-08-30 DIAGNOSIS — R202 Paresthesia of skin: Secondary | ICD-10-CM

## 2014-08-30 LAB — CBC WITH DIFFERENTIAL/PLATELET
BASOS ABS: 0 10*3/uL (ref 0.0–0.1)
Basophils Relative: 0.6 % (ref 0.0–3.0)
EOS ABS: 0.3 10*3/uL (ref 0.0–0.7)
Eosinophils Relative: 3.4 % (ref 0.0–5.0)
HCT: 37.9 % (ref 36.0–46.0)
Hemoglobin: 12.6 g/dL (ref 12.0–15.0)
LYMPHS ABS: 3.5 10*3/uL (ref 0.7–4.0)
Lymphocytes Relative: 48.2 % — ABNORMAL HIGH (ref 12.0–46.0)
MCHC: 33.2 g/dL (ref 30.0–36.0)
MCV: 93.6 fl (ref 78.0–100.0)
Monocytes Absolute: 0.6 10*3/uL (ref 0.1–1.0)
Monocytes Relative: 7.7 % (ref 3.0–12.0)
NEUTROS ABS: 2.9 10*3/uL (ref 1.4–7.7)
Neutrophils Relative %: 40.1 % — ABNORMAL LOW (ref 43.0–77.0)
Platelets: 292 10*3/uL (ref 150.0–400.0)
RBC: 4.05 Mil/uL (ref 3.87–5.11)
RDW: 12.9 % (ref 11.5–15.5)
WBC: 7.4 10*3/uL (ref 4.0–10.5)

## 2014-08-30 LAB — COMPREHENSIVE METABOLIC PANEL
ALBUMIN: 4.3 g/dL (ref 3.5–5.2)
ALK PHOS: 48 U/L (ref 39–117)
ALT: 10 U/L (ref 0–35)
AST: 13 U/L (ref 0–37)
BILIRUBIN TOTAL: 0.9 mg/dL (ref 0.2–1.2)
BUN: 9 mg/dL (ref 6–23)
CO2: 32 mEq/L (ref 19–32)
Calcium: 9.7 mg/dL (ref 8.4–10.5)
Chloride: 106 mEq/L (ref 96–112)
Creatinine, Ser: 0.76 mg/dL (ref 0.40–1.20)
GFR: 97.57 mL/min (ref >60.00)
GLUCOSE: 92 mg/dL (ref 70–99)
POTASSIUM: 3.9 meq/L (ref 3.5–5.1)
SODIUM: 144 meq/L (ref 135–145)
Total Protein: 7.4 g/dL (ref 6.0–8.3)

## 2014-08-30 LAB — VITAMIN B12: VITAMIN B 12: 190 pg/mL — AB (ref 211–911)

## 2014-08-30 NOTE — Progress Notes (Signed)
01/31/11- 23 yoF former smoker with history of sarcoid. Followed at this office in 2008 with fibrocystic scarring. Originally diagnosed after abnormal chest x-ray for job physical. Bronchoscopy with biopsy. Treated with prednisone. [facility]ized at [facility] with community-acquired pneumonia treated with Avelox and prednisone, urinary infection treated with Bactrim. CT scan of the chest 12/17/2010 showed significant pulmonary scarring and bronchiectasis consistent with sarcoid. No obvious superimposed pneumonia. Stable adenopathy. She has finished her discharge meds. She still feels mild vertigo, positional, but back to baseline with shortness of breath cough and wheeze. Mostly exertional dyspnea on stairs or brisk walking with little change over several years. She denies fever, night sweats, adenopathy or rash. She was discharged from the hospital with oxygen at 2 L/Advanced, but she doubts that she needs it now. An albuterol rescue inhaler helps if she is walking briskly or out in the wind. Had flu vaccine November 2012 in the hospital and has history of pneumonia vaccine. Denies history of cardiac disease or allergy. TB skin test was negative years ago. No exposure.  04/04/11- 65 yoF former smoker, followed for hx sarcoid/ scarring, hx pneumonia Blowing nose more than blames minor cough whether related nasal drip. Denies fever, sore throat, sweat or nodes. Rarely needs her rescue inhaler. PFT: 04/04/2011-moderate obstructive airways disease with insignificant response to dilator diffusion severely reduced c/w emphysema. FEV1/FVC 0.52, DLCO 43%. 6 minut.e walk test: 04/04/2011 97%, dropping to 94% and a block, rebounded to 98% with 2 minutes rest. 312 m  08/23/13 Acute OV  Pt presents for an acute office visit.  Complains of increased cough for last few days, had blood tinged mucus few times over last 3 days .  Dark blood mostly, had one episode of bright red blood yesterday happened. Less today.  Usually  about a 1/2-1 tsp at a time w/ mixed mucus /drainage.  No fever, chest pain , orthopnea , edema or calf pain.  Takes aspirin daily .  CXR today with no acute findings. Chronic changes noted.  Has not been seen in >2 years , was started on tudorza last ov but did not like it.  Uses rescues SABA but rarely needs it.  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  CY pt here for hemoptysis.  Started coughing up deep dark red blood Saturday night, up to a tablespoon full at a time.  Pt reports no rash.  We discussed need to keep follow up ov.   10/12/13- 68 yoF former smoker, followed for hx sarcoid/ scarring,COPD,  hx pneumonia FOLLOWS FOR: Pt last seen by CY on 03/2011, pt recently seen by TP for an acute visit on 08/23/2013. Pt states her breathing has slightly improved since seeing TP. Pt states she has good days and bad days d/t the weather. Pt c/o constant pain in right hip x 2 weeks. Pt c/o DOE and mild dry cough. Pt denies CP/tightness.  CXR 08/23/13 MPRESSION:  Stable severe chronic changes with fibrosis and bronchiectasis in  the upper lobes. Findings compatible with sarcoidosis.  Increasing left apical pleural thickening, likely scarring.  Electronically Signed  By: Charlett Nose M.D.  On: 08/23/2013 14:02  08/30/14- 68 yoF former smoker, followed for hx sarcoid/ scarring,COPD,  hx pneumonia, bronchiectasis and hemoptysis in the past. Original diagnosis 2008 bronchoscopy, treated then with prednisone. ACUTE VISIT: Pt states she has lost feelings in her hands; stays cold, balance is off. Breathing is not that bad just concerned with other issues going on.  ACE 33 01/31/11 Complains now that  for 2 weeks she has had tingling in her hands, glove pattern, "like Brillo". Dropping things. When asked about neck pain she complains of "I hurt all over". Denies syncope, diplopia. Has had previous numbness in her feet but not clear that it is like her hands. 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 Rare need for rescue inhaler with little cough, wheeze or phlegm. Denies fever, night sweats, adenopathy.  ROS-see HPI Constitutional:   No-   weight loss, night sweats, fevers, chills, fatigue, lassitude. HEENT:   No-  headaches, difficulty swallowing, tooth/dental problems, sore throat,         nasal congestion, post nasal drip,  CV:  No-   chest pain, orthopnea, PND, swelling in lower extremities, anasarca, dizziness, palpitations Resp: No shortness of breath with exertion or at rest.              No-   productive cough,  + non-productive cough,   coughing up of blood.              No-   change in color of mucus.  No- wheezing.   Skin: No-   rash or lesions. GI:  No-   heartburn, indigestion, abdominal pain, nausea, vomiting, diarrhea,                 change in bowel habits, loss of appetite GU:  MS:  No-   joint pain or swelling.  No- decreased range of motion.  No- back pain. Neuro-     Per HPI Psych:  No- change in mood or affect. No depression or anxiety.  No memory loss.  OBJ General- Alert, Oriented, Affect-appropriate, Distress- none acute, trim Skin- rash-none, lesions- none, excoriation- none Lymphadenopathy- none Head- atraumatic            Eyes- Gross vision intact, PERRLA, conjunctivae ? Pale, clear secretions            Ears- Hearing, canals-normal            Nose- Clear, sniffing, no-Septal dev, mucus, polyps, erosion, perforation             Throat- Mallampati II , mucosa clear , drainage- none, tonsils- atrophic Neck- flexible , trachea midline, no stridor , thyroid nl, carotid no bruit Chest - symmetrical excursion , unlabored           Heart/CV- RRR , no murmur , no gallop  , no rub, nl s1 s2                           - JVD- none , edema- none, stasis changes- none, varices- none           Lung- clear to P&A-Little or no adventitious noise, wheeze- none, cough- none , dullness-none, rub- none           Chest wall-  Abd- Br/ Gen/ Rectal- Not  done, not indicated Extrem- cyanosis- none, clubbing, none, atrophy- none, strength- nl Neuro- grossly intact to observation, grip seems normal

## 2014-08-30 NOTE — Telephone Encounter (Signed)
Dot LanesKrista with [facility]. Called and wanted a [employer] Referral. Patient was there being seen.  Unable to give a referral due to patient never being seen at our office.

## 2014-08-30 NOTE — Patient Instructions (Signed)
Order- CXR  Dx sarcoid              Lab- CMET, ACE level, CBC w diff, B12 level  Order- referral to [facility]   Dx paresthesias in hands

## 2014-08-30 NOTE — Progress Notes (Signed)
Quick Note:  Called and spoke to pt. Informed her of the results and recs per CY. Pt verbalized understanding and denied any further questions or concerns at this time.   ______ 

## 2014-08-30 NOTE — Assessment & Plan Note (Signed)
Clinically burned-out sarcoid with residual bronchiectasis. Doubt direct connection to her new complaint of paresthesias. Plan-basic labs including ACE level, chest x-ray

## 2014-08-30 NOTE — Assessment & Plan Note (Signed)
She claims to notice little dyspnea and have little cough or wheeze. Not needing rescue inhaler. No recurrence of hemoptysis.

## 2014-08-30 NOTE — Assessment & Plan Note (Signed)
Consider peripheral neuropathy secondary to cervical disc disease Plan-refer to neurology

## 2014-08-31 LAB — ANGIOTENSIN CONVERTING ENZYME: ANGIOTENSIN-CONVERTING ENZYME: 37 U/L (ref 8–52)

## 2014-09-12 ENCOUNTER — Telehealth: Payer: Self-pay | Admitting: Internal Medicine

## 2014-09-12 NOTE — Telephone Encounter (Signed)
yes

## 2014-09-12 NOTE — Telephone Encounter (Signed)
Patient states it is both hands.

## 2014-09-12 NOTE — Telephone Encounter (Signed)
It has been over 3 years since patient has been seen.  Can we reestablish?  Patient thinks she has a pinch nerve.  She would like to be seen b/c she has no feeling in right hand.

## 2014-09-13 ENCOUNTER — Ambulatory Visit: Payer: BC Managed Care – PPO | Admitting: Internal Medicine

## 2014-09-13 NOTE — Telephone Encounter (Signed)
Got scheduled  °

## 2014-09-15 ENCOUNTER — Ambulatory Visit (INDEPENDENT_AMBULATORY_CARE_PROVIDER_SITE_OTHER): Payer: Commercial Managed Care - HMO | Admitting: Internal Medicine

## 2014-09-15 ENCOUNTER — Encounter: Payer: Self-pay | Admitting: Internal Medicine

## 2014-09-15 ENCOUNTER — Other Ambulatory Visit (INDEPENDENT_AMBULATORY_CARE_PROVIDER_SITE_OTHER): Payer: Commercial Managed Care - HMO

## 2014-09-15 VITALS — BP 118/78 | HR 68 | Temp 98.2°F | Resp 16 | Ht 62.0 in | Wt 132.0 lb

## 2014-09-15 DIAGNOSIS — K920 Hematemesis: Secondary | ICD-10-CM

## 2014-09-15 DIAGNOSIS — G63 Polyneuropathy in diseases classified elsewhere: Secondary | ICD-10-CM

## 2014-09-15 DIAGNOSIS — I10 Essential (primary) hypertension: Secondary | ICD-10-CM | POA: Diagnosis not present

## 2014-09-15 DIAGNOSIS — E538 Deficiency of other specified B group vitamins: Secondary | ICD-10-CM | POA: Diagnosis not present

## 2014-09-15 LAB — CBC WITH DIFFERENTIAL/PLATELET
BASOS ABS: 0 10*3/uL (ref 0.0–0.1)
BASOS PCT: 0.2 % (ref 0.0–3.0)
EOS ABS: 0.2 10*3/uL (ref 0.0–0.7)
Eosinophils Relative: 3 % (ref 0.0–5.0)
HCT: 35.5 % — ABNORMAL LOW (ref 36.0–46.0)
HEMOGLOBIN: 11.9 g/dL — AB (ref 12.0–15.0)
Lymphocytes Relative: 50.1 % — ABNORMAL HIGH (ref 12.0–46.0)
Lymphs Abs: 3.4 10*3/uL (ref 0.7–4.0)
MCHC: 33.4 g/dL (ref 30.0–36.0)
MCV: 93.8 fl (ref 78.0–100.0)
MONOS PCT: 8 % (ref 3.0–12.0)
Monocytes Absolute: 0.5 10*3/uL (ref 0.1–1.0)
NEUTROS PCT: 38.7 % — AB (ref 43.0–77.0)
Neutro Abs: 2.6 10*3/uL (ref 1.4–7.7)
Platelets: 295 10*3/uL (ref 150.0–400.0)
RBC: 3.78 Mil/uL — AB (ref 3.87–5.11)
RDW: 12.6 % (ref 11.5–15.5)
WBC: 6.8 10*3/uL (ref 4.0–10.5)

## 2014-09-15 LAB — BASIC METABOLIC PANEL
BUN: 12 mg/dL (ref 6–23)
CALCIUM: 9.5 mg/dL (ref 8.4–10.5)
CHLORIDE: 106 meq/L (ref 96–112)
CO2: 30 mEq/L (ref 19–32)
CREATININE: 0.67 mg/dL (ref 0.40–1.20)
GFR: 112.83 mL/min (ref >60.00)
Glucose, Bld: 103 mg/dL — ABNORMAL HIGH (ref 70–99)
POTASSIUM: 3.7 meq/L (ref 3.5–5.1)
Sodium: 141 mEq/L (ref 135–145)

## 2014-09-15 LAB — PROTIME-INR
INR: 1 ratio (ref 0.8–1.0)
Prothrombin Time: 10.9 s (ref 9.6–13.1)

## 2014-09-15 LAB — APTT: APTT: 25.7 s (ref 23.4–32.7)

## 2014-09-15 MED ORDER — OMEPRAZOLE 40 MG PO CPDR: 40.0000 mg | DELAYED_RELEASE_CAPSULE | Freq: Every day | ORAL | Status: DC

## 2014-09-15 MED ORDER — CYANOCOBALAMIN 1000 MCG/ML IJ SOLN
1000.0000 ug | Freq: Once | INTRAMUSCULAR | Status: AC
  Administered 2014-09-15: 1000 ug via INTRAMUSCULAR

## 2014-09-15 NOTE — Progress Notes (Signed)
Subjective:  Patient ID: Patricia Davies, female    DOB: 1945-07-18  Age: 69 y.o. MRN: [id number]  CC: Hypertension   HPI Patricia Davies presents for a recheck on high blood pressure but her main complaint is that she vomited up some blood about a week ago. She describes a very brief, quick episode where she felt nauseous and vomited up a scant amount of blood. That has not recurred over the last week. She was recently seen by another doctor and complained of bilateral hand and arm numbness and was found to have B12 deficiency.  History Patricia Davies has a past medical history of Hypertension; History of nephrolithiasis; and Sarcoidosis of skin.   She has past surgical history that includes Cholecystectomy and Abdominal hysterectomy.   Her family history includes Hypertension in her other.She reports that she quit smoking about 24 years ago. Her smoking use included Cigarettes. She has a 10 pack-year smoking history. She has never used smokeless tobacco. She reports that she drinks about 0.6 oz of alcohol per week. She reports that she does not use illicit drugs.  Outpatient Prescriptions Prior to Visit  Medication Sig Dispense Refill  . albuterol (PROVENTIL HFA;VENTOLIN HFA) 108 (90 BASE) MCG/ACT inhaler Inhale 1 puff into the lungs every 4 (four) hours as needed. For shortness of breath     No facility-administered medications prior to visit.    ROS Review of Systems  Constitutional: Negative.  Negative for fever, chills, diaphoresis, activity change, appetite change, fatigue and unexpected weight change.  HENT: Negative.  Negative for sore throat, trouble swallowing and voice change.   Eyes: Negative.   Respiratory: Negative.  Negative for cough, choking, chest tightness, shortness of breath and stridor.   Cardiovascular: Negative.  Negative for chest pain, palpitations and leg swelling.  Gastrointestinal: Negative.  Negative for nausea, vomiting, abdominal pain, diarrhea, constipation, blood in  stool, anal bleeding and rectal pain.  Endocrine: Negative.   Genitourinary: Negative.  Negative for difficulty urinating.  Musculoskeletal: Negative.   Skin: Negative.  Negative for pallor and rash.  Allergic/Immunologic: Negative.   Neurological: Positive for numbness. Negative for dizziness, tremors, seizures, syncope, speech difficulty, weakness, light-headedness and headaches.  Hematological: Negative.  Negative for adenopathy. Does not bruise/bleed easily.  Psychiatric/Behavioral: Negative.     Objective:  BP 118/78 mmHg  Pulse 68  Temp(Src) 98.2 F (36.8 C) (Oral)  Resp 16  Ht  (1.575 m)  Wt 132 lb (59.875 kg)  BMI 24.14 kg/m2  SpO2 97%  Physical Exam  Constitutional: No distress.  HENT:  Mouth/Throat: Oropharynx is clear and moist. No oropharyngeal exudate.  Eyes: Conjunctivae are normal. Right eye exhibits no discharge. Left eye exhibits no discharge. No scleral icterus.  Neck: Normal range of motion. Neck supple. No JVD present. No tracheal deviation present. No thyromegaly present.  Cardiovascular: Normal rate, regular rhythm, normal heart sounds and intact distal pulses.  Exam reveals no gallop and no friction rub.   No murmur heard. Pulmonary/Chest: Effort normal and breath sounds normal. No stridor. No respiratory distress. She has no wheezes. She has no rales. She exhibits no tenderness.  Abdominal: Soft. Bowel sounds are normal. She exhibits no distension and no mass. There is no tenderness. There is no rebound and no guarding.  Musculoskeletal: Normal range of motion. She exhibits no edema or tenderness.  Lymphadenopathy:    She has no cervical adenopathy.  Neurological: She is alert. She has normal strength. She displays no atrophy, no tremor and normal  reflexes. No cranial nerve deficit or sensory deficit. She exhibits normal muscle tone. She displays no seizure activity. Coordination and gait normal.  Reflex Scores:      Tricep reflexes are 0 on the right  side and 0 on the left side.      Bicep reflexes are 0 on the right side and 0 on the left side.      Brachioradialis reflexes are 0 on the right side and 0 on the left side.      Patellar reflexes are 0 on the right side and 0 on the left side.      Achilles reflexes are 0 on the right side and 0 on the left side. Skin: Skin is warm and dry. No rash noted. She is not diaphoretic. No erythema. No pallor.  Psychiatric: She has a normal mood and affect. Her behavior is normal. Judgment and thought content normal.  Vitals reviewed.   Lab Results  Component Value Date   WBC 6.8 09/15/2014   HGB 11.9* 09/15/2014   HCT 35.5* 09/15/2014   PLT 295.0 09/15/2014   GLUCOSE 103* 09/15/2014   ALT 10 08/30/2014   AST 13 08/30/2014   NA 141 09/15/2014   K 3.7 09/15/2014   CL 106 09/15/2014   CREATININE 0.67 09/15/2014   BUN 12 09/15/2014   CO2 30 09/15/2014   TSH 1.123 12/15/2010   INR 1.0 09/15/2014   HGBA1C 5.0 01/16/2011    Assessment & Plan:   [name] was seen today for hypertension.  Diagnoses and all orders for this visit:  Essential hypertension- blood pressure is well-controlled, lites and renal function are stable. Orders: -     Basic metabolic panel; Future  Vitamin B12 deficiency neuropathy- I will check a methylmalonic acid level to make sure that this is B-12 deficiency but today will go ahead and give her a B12 injection. She is asked to return in 1 week for another B12 injection. Orders: -     CBC with Differential/Platelet; Future -     Methylmalonic acid, serum; Future -     cyanocobalamin ((VITAMIN B-12)) injection 1,000 mcg; Inject 1 mL (1,000 mcg total) into the muscle once.  Hematemesis without nausea- she has some risk factors for upper GI pathology with a history history of aspirin intake and an occasional beer. She appears stable today. She has not had any more vomiting of blood over the last week. She has developed a mild anemia but she also has B12 deficiency.  Will start treating for upper GI pathology with a proton pump inhibitor and I have asked her to see GI as soon as possible. She agrees to stop taking aspirin and any other anti-inflammatories and to stop drinking beer. Orders: -     Ambulatory referral to Gastroenterology -     CBC with Differential/Platelet; Future -     Basic metabolic panel; Future -     Protime-INR; Future -     APTT; Future -     omeprazole (PRILOSEC) 40 MG capsule; Take 1 capsule (40 mg total) by mouth daily.   I have discontinued Ms. Cromie's albuterol. I am also having her start on omeprazole. We administered cyanocobalamin.  Meds ordered this encounter  Medications  . omeprazole (PRILOSEC) 40 MG capsule    Sig: Take 1 capsule (40 mg total) by mouth daily.    Dispense:  90 capsule    Refill:  3  . cyanocobalamin ((VITAMIN B-12)) injection 1,000 mcg    Sig:  Follow-up: Return in about 1 week (around 09/22/2014).  Sanda Linger, MD

## 2014-09-15 NOTE — Patient Instructions (Signed)

## 2014-09-15 NOTE — Progress Notes (Signed)
Pre visit review using our clinic review tool, if applicable. No additional management support is needed unless otherwise documented below in the visit note. 

## 2014-09-16 ENCOUNTER — Encounter: Payer: Self-pay | Admitting: Nurse Practitioner

## 2014-09-19 LAB — METHYLMALONIC ACID, SERUM: METHYLMALONIC ACID, QUANT: 113 nmol/L (ref 87–318)

## 2014-09-28 ENCOUNTER — Encounter: Payer: Self-pay | Admitting: Gastroenterology

## 2014-10-03 ENCOUNTER — Ambulatory Visit (INDEPENDENT_AMBULATORY_CARE_PROVIDER_SITE_OTHER): Payer: Commercial Managed Care - HMO | Admitting: Nurse Practitioner

## 2014-10-03 ENCOUNTER — Encounter: Payer: Self-pay | Admitting: Nurse Practitioner

## 2014-10-03 VITALS — BP 142/72 | HR 68 | Ht 61.0 in | Wt 134.5 lb

## 2014-10-03 DIAGNOSIS — K920 Hematemesis: Secondary | ICD-10-CM

## 2014-10-03 NOTE — Progress Notes (Signed)
Reviewed and agree with management plan.  Priscillia Fouch T. Ania Levay, MD FACG 

## 2014-10-03 NOTE — Progress Notes (Signed)
    HPI :  Patient is a 69 year old female referred by PCP for evaluation of hematemesis. Patient saw her PCP on the 14th of this month, she reported an episode of hematemesis a week prior. Patient describes the episode as "coughing up" blood.  She also describes regurgitation of blood, blood "just fills up my mouth". No recurrent bleeding since prior to PCP visit. Patient had no associated abdominal pain. No rectal bleeding or black stools. No significant NSAID use. No history of PUD.     Past Medical History  Diagnosis Date  . Hypertension   . History of nephrolithiasis   . Sarcoidosis of skin   . COPD (chronic obstructive pulmonary disease)   . Vitamin B12 deficiency      Family History  Problem Relation Age of Onset  . Hypertension Maternal Aunt   . Diabetes Maternal Aunt    History  Substance Use Topics  . Smoking status: Former Smoker -- 0.50 packs/day for 20 years    Types: Cigarettes    Quit date: 03/04/1990  . Smokeless tobacco: Never Used     Comment: Regukar exercise - Yes  . Alcohol Use: 0.6 oz/week    1 Cans of beer per week     Comment: social   Current Outpatient Prescriptions  Medication Sig Dispense Refill  . omeprazole (PRILOSEC) 40 MG capsule Take 1 capsule (40 mg total) by mouth daily. 90 capsule 3   No current facility-administered medications for this visit.   No Known Allergies   Review of Systems: Positive for depression, fatigue, shortness of breath. All other systems reviewed and negative except where noted in HPI.   Physical Exam: BP 142/72 mmHg  Pulse 68  Ht  (1.549 m)  Wt 134 lb 8 oz (61.009 kg)  BMI 25.43 kg/m2 Constitutional: Pleasant,well-developed, black female in no acute distress. HEENT: Normocephalic and atraumatic. Conjunctivae are normal. No scleral icterus. Neck supple.  Cardiovascular: Normal rate, regular rhythm.  Pulmonary/chest: Effort normal and breath sounds normal. No wheezing, rales or rhonchi. Abdominal: Soft,  nondistended, nontender. Bowel sounds active throughout. There are no masses palpable. No hepatomegaly. Extremities: no edema Lymphadenopathy: No cervical adenopathy noted. Neurological: Alert and oriented to person place and time. Skin: Skin is warm and dry. No rashes noted. Psychiatric: Normal mood and affect. Behavior is normal.   ASSESSMENT AND PLAN:  23. 69 year old female with episode of hematemesis vrs hemoptysis.  Sounds like she had a few episodes over a 1-2 day period three weeks ago, none since. No blood in stool, heme negative today. Hgb on 7/14 was 11.9, not far from baseline.   Recently started Omeprazole, recommend she continue for time being.  Will rule out upper GI lesion but not sure this was hematemesis. Sounds more like hemoptysis. For further evaluation patient will be scheduled for EGD. The benefits, risks, and potential complications of EGD with possible biopsies were discussed with the patient and she agrees to proceed.   2. COPD / Sarcoidosis,  Followed by Pulmonary and last seen late June. Of note, patient reported hemoptysis at that visit. CXR revealed stable, severe chronic changes.   3. Colon cancer screening. Will address at later date, after we sort out #1.   Cc: Sanda Linger, MD

## 2014-10-03 NOTE — Patient Instructions (Addendum)
You have been scheduled for an endoscopy. Please follow written instructions given to you at your visit today. If you use inhalers (even only as needed), please bring them with you on the day of your procedure. Your physician has requested that you go to www.startemmi.com and enter the access code given to you at your visit today. This web site gives a general overview about your procedure. However, you should still follow specific instructions given to you by our office regarding your preparation for the procedure.  Continue Omeprazole,. No NSAIDS. ( Advil, Ibuprofen, Aleve, Motrin)  Call us if you have recurrent bleeding.

## 2014-10-12 ENCOUNTER — Telehealth: Payer: Self-pay | Admitting: Internal Medicine

## 2014-10-12 NOTE — Telephone Encounter (Signed)
Pt called in and said that her hand is not getting any better and she said that she thought that she was suppose to continue to get b12 injections?  She said that one has ever called her back to let her know if she was suppose to keep getting the injections?  Can you call her when you get a chance?      Best number [phone]

## 2014-10-12 NOTE — Telephone Encounter (Signed)
Discharge instructions from last Ov stated to return in about a week for another b12. Pt did not see on discharge. She is coming in on Friday for a b12. Pt stated she just wanted a nurse visit vs an OV

## 2014-10-13 ENCOUNTER — Ambulatory Visit: Payer: Medicare Other | Admitting: Internal Medicine

## 2014-10-14 ENCOUNTER — Ambulatory Visit (INDEPENDENT_AMBULATORY_CARE_PROVIDER_SITE_OTHER): Payer: Commercial Managed Care - HMO

## 2014-10-14 DIAGNOSIS — E538 Deficiency of other specified B group vitamins: Secondary | ICD-10-CM

## 2014-10-14 DIAGNOSIS — G63 Polyneuropathy in diseases classified elsewhere: Principal | ICD-10-CM

## 2014-10-14 MED ORDER — CYANOCOBALAMIN 1000 MCG/ML IJ SOLN
1000.0000 ug | Freq: Once | INTRAMUSCULAR | Status: AC
  Administered 2014-10-14: 1000 ug via INTRAMUSCULAR

## 2014-10-14 NOTE — Telephone Encounter (Signed)
PT came in for b12 injection. Wants to know how often is she getting these since hand is not improving? Although there was a missed week since she did not see the return date on discharge instructions.

## 2014-10-15 NOTE — Telephone Encounter (Signed)
She should get a B12 shot once a week for 3 weeks, then her B12 dose will be once monthly. it can take up to a year for the nerves to recover from the B12 deficiency. If she has ongoing symptoms she should see me for follow-up.

## 2014-10-17 NOTE — Telephone Encounter (Signed)
LVM for pt to call back as soon as possible.   

## 2014-10-18 ENCOUNTER — Encounter: Payer: Self-pay | Admitting: Gastroenterology

## 2014-10-18 ENCOUNTER — Ambulatory Visit (AMBULATORY_SURGERY_CENTER): Payer: Commercial Managed Care - HMO | Admitting: Gastroenterology

## 2014-10-18 VITALS — BP 160/74 | HR 55 | Temp 97.0°F | Resp 23 | Ht 61.0 in | Wt 134.0 lb

## 2014-10-18 DIAGNOSIS — D869 Sarcoidosis, unspecified: Secondary | ICD-10-CM | POA: Diagnosis not present

## 2014-10-18 DIAGNOSIS — I10 Essential (primary) hypertension: Secondary | ICD-10-CM | POA: Diagnosis not present

## 2014-10-18 DIAGNOSIS — K3189 Other diseases of stomach and duodenum: Secondary | ICD-10-CM

## 2014-10-18 DIAGNOSIS — K920 Hematemesis: Secondary | ICD-10-CM

## 2014-10-18 DIAGNOSIS — K317 Polyp of stomach and duodenum: Secondary | ICD-10-CM | POA: Diagnosis not present

## 2014-10-18 DIAGNOSIS — J449 Chronic obstructive pulmonary disease, unspecified: Secondary | ICD-10-CM | POA: Diagnosis not present

## 2014-10-18 MED ORDER — SODIUM CHLORIDE 0.9 % IV SOLN: 500.0000 mL | INTRAVENOUS | Status: DC

## 2014-10-18 NOTE — Progress Notes (Signed)
Report to PACU, RN, vss, BBS= Clear.  

## 2014-10-18 NOTE — Progress Notes (Signed)
Called to room to assist during endoscopic procedure.  Patient ID and intended procedure confirmed with present staff. Received instructions for my participation in the procedure from the performing physician.  

## 2014-10-18 NOTE — Op Note (Signed)
[facility] [address] [place], [address]   ENDOSCOPY PROCEDURE REPORT  PATIENT: Patricia Davies, Patricia Davies  MR#: [id number] BIRTHDATE: May 25, 1945 , 69  yrs. old GENDER: female ENDOSCOPIST: Meryl Dare, MD, Clementeen Graham REFERRED BY:  [name], M.D. PROCEDURE DATE:  10/18/2014 PROCEDURE:  EGD w/ biopsy ASA CLASS:     Class II INDICATIONS:  hematemesis. MEDICATIONS: Monitored anesthesia care and Propofol 100 mg IV TOPICAL ANESTHETIC: none DESCRIPTION OF PROCEDURE: After the risks benefits and alternatives of the procedure were thoroughly explained, informed consent was obtained.  The [id number] endoscope was introduced through the mouth and advanced to the second portion of the duodenum , Without limitations.  The instrument was slowly withdrawn as the mucosa was fully examined.    STOMACH: A single subepithelial 5mm nodules was located in the gastric body, anterior wall.  Multiple biopsies performed.  The stomach otherwise appeared normal. ESOPHAGUS: The mucosa of the esophagus appeared normal. DUODENUM: The duodenal mucosa showed no abnormalities in the bulb and 2nd part of the duodenum.  Retroflexed views revealed a small hiatal hernia.  The scope was then withdrawn from the patient and the procedure completed.  COMPLICATIONS: There were no immediate complications.  ENDOSCOPIC IMPRESSION: 1.   Gastric body subepithial nodule; multiple biopsies performed 2.   Small hiatal hernia 3.   The EGD otherwise appeared normal  RECOMMENDATIONS: 1.  Await pathology results 2.  No GI cause for hematemesis noted. Return to PCP-consider ENT, pulmonary etiologies  eSigned:  Meryl Dare, MD, [facility] 10/18/2014 10:18 AM

## 2014-10-18 NOTE — Patient Instructions (Signed)
YOU HAD AN ENDOSCOPIC PROCEDURE TODAY AT THE [facility]:   Refer to the procedure report that was given to you for any specific questions about what was found during the examination.  If the procedure report does not answer your questions, please call your gastroenterologist to clarify.  If you requested that your care partner not be given the details of your procedure findings, then the procedure report has been included in a sealed envelope for you to review at your convenience later.  YOU SHOULD EXPECT: Some feelings of bloating in the abdomen. Passage of more gas than usual.  Walking can help get rid of the air that was put into your GI tract during the procedure and reduce the bloating. If you had a lower endoscopy (such as a colonoscopy or flexible sigmoidoscopy) you may notice spotting of blood in your stool or on the toilet paper. If you underwent a bowel prep for your procedure, you may not have a normal bowel movement for a few days.  Please Note:  You might notice some irritation and congestion in your nose or some drainage.  This is from the oxygen used during your procedure.  There is no need for concern and it should clear up in a day or so.  SYMPTOMS TO REPORT IMMEDIATELY:    Following upper endoscopy (EGD)  Vomiting of blood or coffee ground material  New chest pain or pain under the shoulder blades  Painful or persistently difficult swallowing  New shortness of breath  Fever of 100F or higher  Black, tarry-looking stools  For urgent or emergent issues, a gastroenterologist can be reached at any hour by calling [phone].   DIET: Your first meal following the procedure should be a small meal and then it is ok to progress to your normal diet. Heavy or fried foods are harder to digest and may make you feel nauseous or bloated.  Likewise, meals heavy in dairy and vegetables can increase bloating.  Drink plenty of fluids but you should avoid alcoholic beverages  for 24 hours.  ACTIVITY:  You should plan to take it easy for the rest of today and you should NOT DRIVE or use heavy machinery until tomorrow (because of the sedation medicines used during the test).    FOLLOW UP: Our staff will call the number listed on your records the next business day following your procedure to check on you and address any questions or concerns that you may have regarding the information given to you following your procedure. If we do not reach you, we will leave a message.  However, if you are feeling well and you are not experiencing any problems, there is no need to return our call.  We will assume that you have returned to your regular daily activities without incident.  If any biopsies were taken you will be contacted by phone or by letter within the next 1-3 weeks.  Please call us at [phone] if you have not heard about the biopsies in 3 weeks.    SIGNATURES/CONFIDENTIALITY: You and/or your care partner have signed paperwork which will be entered into your electronic medical record.  These signatures attest to the fact that that the information above on your After Visit Summary has been reviewed and is understood.  Full responsibility of the confidentiality of this discharge information lies with you and/or your care -partner.  Small hiatal hernia Await pathology report

## 2014-10-19 ENCOUNTER — Telehealth: Payer: Self-pay

## 2014-10-19 NOTE — Telephone Encounter (Signed)
  Follow up Call-  Call back number 10/18/2014  Post procedure Call Back phone  # [phone]  Permission to leave phone message No     Patient questions:  Do you have a fever, pain , or abdominal swelling? No. Pain Score  0 *  Have you tolerated food without any problems? Yes.    Have you been able to return to your normal activities? Yes.    Do you have any questions about your discharge instructions: Diet   No. Medications  No. Follow up visit  No.  Do you have questions or concerns about your Care? No.  Actions: * If pain score is 4 or above: No action needed, pain <4.

## 2014-10-24 ENCOUNTER — Encounter: Payer: Self-pay | Admitting: Gastroenterology

## 2014-11-02 ENCOUNTER — Ambulatory Visit: Payer: Self-pay | Admitting: Internal Medicine

## 2015-03-16 ENCOUNTER — Encounter (HOSPITAL_COMMUNITY): Payer: Self-pay | Admitting: Vascular Surgery

## 2015-03-16 ENCOUNTER — Emergency Department (HOSPITAL_COMMUNITY)
Admission: EM | Admit: 2015-03-16 | Discharge: 2015-03-16 | Disposition: A | Discharge status: Home / Self Care | Payer: Commercial Managed Care - HMO | Attending: Emergency Medicine | Admitting: Emergency Medicine

## 2015-03-16 DIAGNOSIS — Z8639 Personal history of other endocrine, nutritional and metabolic disease: Secondary | ICD-10-CM | POA: Insufficient documentation

## 2015-03-16 DIAGNOSIS — S199XXA Unspecified injury of neck, initial encounter: Secondary | ICD-10-CM | POA: Diagnosis not present

## 2015-03-16 DIAGNOSIS — I10 Essential (primary) hypertension: Secondary | ICD-10-CM | POA: Insufficient documentation

## 2015-03-16 DIAGNOSIS — Y999 Unspecified external cause status: Secondary | ICD-10-CM | POA: Diagnosis not present

## 2015-03-16 DIAGNOSIS — J449 Chronic obstructive pulmonary disease, unspecified: Secondary | ICD-10-CM | POA: Insufficient documentation

## 2015-03-16 DIAGNOSIS — Z87442 Personal history of urinary calculi: Secondary | ICD-10-CM | POA: Insufficient documentation

## 2015-03-16 DIAGNOSIS — M542 Cervicalgia: Secondary | ICD-10-CM

## 2015-03-16 DIAGNOSIS — Z862 Personal history of diseases of the blood and blood-forming organs and certain disorders involving the immune mechanism: Secondary | ICD-10-CM | POA: Diagnosis not present

## 2015-03-16 DIAGNOSIS — Y92410 Unspecified street and highway as the place of occurrence of the external cause: Secondary | ICD-10-CM | POA: Insufficient documentation

## 2015-03-16 DIAGNOSIS — T148 Other injury of unspecified body region: Secondary | ICD-10-CM | POA: Diagnosis not present

## 2015-03-16 DIAGNOSIS — Z87891 Personal history of nicotine dependence: Secondary | ICD-10-CM | POA: Insufficient documentation

## 2015-03-16 DIAGNOSIS — Z79899 Other long term (current) drug therapy: Secondary | ICD-10-CM | POA: Insufficient documentation

## 2015-03-16 DIAGNOSIS — Y9389 Activity, other specified: Secondary | ICD-10-CM | POA: Diagnosis not present

## 2015-03-16 MED ORDER — ACETAMINOPHEN 325 MG PO TABS: 650.0000 mg | ORAL_TABLET | Freq: Four times a day (QID) | ORAL | Status: DC | PRN

## 2015-03-16 MED ORDER — ACETAMINOPHEN 325 MG PO TABS
650.0000 mg | ORAL_TABLET | Freq: Once | ORAL | Status: AC
  Administered 2015-03-16: 650 mg via ORAL
  Filled 2015-03-16: qty 2

## 2015-03-16 MED ORDER — METHOCARBAMOL 500 MG PO TABS: 500.0000 mg | ORAL_TABLET | Freq: Two times a day (BID) | ORAL | Status: DC | PRN
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
## 2015-03-16 NOTE — ED Notes (Signed)
Pt discharged home with family. Rx x 2 given for tylenol and Robaxin.
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
## 2015-06-01 ENCOUNTER — Encounter: Payer: Self-pay | Admitting: Internal Medicine

## 2015-06-01 ENCOUNTER — Other Ambulatory Visit (INDEPENDENT_AMBULATORY_CARE_PROVIDER_SITE_OTHER): Payer: Commercial Managed Care - HMO

## 2015-06-01 ENCOUNTER — Ambulatory Visit (INDEPENDENT_AMBULATORY_CARE_PROVIDER_SITE_OTHER): Payer: Commercial Managed Care - HMO | Admitting: Internal Medicine

## 2015-06-01 VITALS — BP 130/70 | HR 68 | Temp 97.9°F | Resp 16 | Ht 61.0 in | Wt 127.0 lb

## 2015-06-01 DIAGNOSIS — D869 Sarcoidosis, unspecified: Secondary | ICD-10-CM

## 2015-06-01 DIAGNOSIS — G63 Polyneuropathy in diseases classified elsewhere: Secondary | ICD-10-CM

## 2015-06-01 DIAGNOSIS — R739 Hyperglycemia, unspecified: Secondary | ICD-10-CM | POA: Diagnosis not present

## 2015-06-01 DIAGNOSIS — I10 Essential (primary) hypertension: Secondary | ICD-10-CM

## 2015-06-01 DIAGNOSIS — D520 Dietary folate deficiency anemia: Secondary | ICD-10-CM

## 2015-06-01 DIAGNOSIS — R202 Paresthesia of skin: Secondary | ICD-10-CM | POA: Diagnosis not present

## 2015-06-01 DIAGNOSIS — E538 Deficiency of other specified B group vitamins: Secondary | ICD-10-CM

## 2015-06-01 DIAGNOSIS — J449 Chronic obstructive pulmonary disease, unspecified: Secondary | ICD-10-CM | POA: Diagnosis not present

## 2015-06-01 LAB — CBC WITH DIFFERENTIAL/PLATELET
BASOS PCT: 0.3 % (ref 0.0–3.0)
Basophils Absolute: 0 10*3/uL (ref 0.0–0.1)
EOS ABS: 0.1 10*3/uL (ref 0.0–0.7)
Eosinophils Relative: 2.1 % (ref 0.0–5.0)
HCT: 37.6 % (ref 36.0–46.0)
Hemoglobin: 12.5 g/dL (ref 12.0–15.0)
LYMPHS ABS: 3 10*3/uL (ref 0.7–4.0)
Lymphocytes Relative: 44.8 % (ref 12.0–46.0)
MCHC: 33.2 g/dL (ref 30.0–36.0)
MCV: 92.6 fl (ref 78.0–100.0)
MONO ABS: 0.6 10*3/uL (ref 0.1–1.0)
Monocytes Relative: 9.3 % (ref 3.0–12.0)
NEUTROS ABS: 2.9 10*3/uL (ref 1.4–7.7)
NEUTROS PCT: 43.5 % (ref 43.0–77.0)
PLATELETS: 274 10*3/uL (ref 150.0–400.0)
RBC: 4.06 Mil/uL (ref 3.87–5.11)
RDW: 13.3 % (ref 11.5–15.5)
WBC: 6.8 10*3/uL (ref 4.0–10.5)

## 2015-06-01 LAB — FOLATE: Folate: 5.6 ng/mL — ABNORMAL LOW (ref >5.9)

## 2015-06-01 LAB — COMPREHENSIVE METABOLIC PANEL
ALT: 9 U/L (ref 0–35)
AST: 12 U/L (ref 0–37)
Albumin: 4.1 g/dL (ref 3.5–5.2)
Alkaline Phosphatase: 46 U/L (ref 39–117)
BILIRUBIN TOTAL: 0.8 mg/dL (ref 0.2–1.2)
BUN: 14 mg/dL (ref 6–23)
CHLORIDE: 106 meq/L (ref 96–112)
CO2: 32 meq/L (ref 19–32)
CREATININE: 0.71 mg/dL (ref 0.40–1.20)
Calcium: 9.9 mg/dL (ref 8.4–10.5)
GFR: 105.3 mL/min (ref >60.00)
Glucose, Bld: 85 mg/dL (ref 70–99)
Potassium: 3.8 mEq/L (ref 3.5–5.1)
SODIUM: 143 meq/L (ref 135–145)
Total Protein: 6.9 g/dL (ref 6.0–8.3)

## 2015-06-01 LAB — HEMOGLOBIN A1C: HEMOGLOBIN A1C: 4.9 % (ref 4.6–6.5)

## 2015-06-01 LAB — VITAMIN B12: VITAMIN B 12: 191 pg/mL — AB (ref 211–911)

## 2015-06-01 MED ORDER — FOLIC ACID 1 MG PO TABS: 1.0000 mg | ORAL_TABLET | Freq: Every day | ORAL | Status: DC

## 2015-06-01 MED ORDER — CYANOCOBALAMIN 2000 MCG PO TABS: 2000.0000 ug | ORAL_TABLET | Freq: Every day | ORAL | Status: DC

## 2015-06-01 NOTE — Progress Notes (Signed)
Pre visit review using our clinic review tool, if applicable. No additional management support is needed unless otherwise documented below in the visit note. 

## 2015-06-01 NOTE — Patient Instructions (Signed)
Anemia, Nonspecific Anemia is a condition in which the concentration of red blood cells or hemoglobin in the blood is below normal. Hemoglobin is a substance in red blood cells that carries oxygen to the tissues of the body. Anemia results in not enough oxygen reaching these tissues.  CAUSES  Common causes of anemia include:   Excessive bleeding. Bleeding may be internal or external. This includes excessive bleeding from periods (in women) or from the intestine.   Poor nutrition.   Chronic kidney, thyroid, and liver disease.  Bone marrow disorders that decrease red blood cell production.  Cancer and treatments for cancer.  HIV, AIDS, and their treatments.  Spleen problems that increase red blood cell destruction.  Blood disorders.  Excess destruction of red blood cells due to infection, medicines, and autoimmune disorders. SIGNS AND SYMPTOMS   Minor weakness.   Dizziness.   Headache.  Palpitations.   Shortness of breath, especially with exercise.   Paleness.  Cold sensitivity.  Indigestion.  Nausea.  Difficulty sleeping.  Difficulty concentrating. Symptoms may occur suddenly or they may develop slowly.  DIAGNOSIS  Additional blood tests are often needed. These help your health care provider determine the best treatment. Your health care provider will check your stool for blood and look for other causes of blood loss.  TREATMENT  Treatment varies depending on the cause of the anemia. Treatment can include:   Supplements of iron, vitamin B12, or folic acid.   Hormone medicines.   A blood transfusion. This may be needed if blood loss is severe.   Hospitalization. This may be needed if there is significant continual blood loss.   Dietary changes.  Spleen removal. HOME CARE INSTRUCTIONS Keep all follow-up appointments. It often takes many weeks to correct anemia, and having your health care provider check on your condition and your response to  treatment is very important. SEEK IMMEDIATE MEDICAL CARE IF:   You develop extreme weakness, shortness of breath, or chest pain.   You become dizzy or have trouble concentrating.  You develop heavy vaginal bleeding.   You develop a rash.   You have bloody or black, tarry stools.   You faint.   You vomit up blood.   You vomit repeatedly.   You have abdominal pain.  You have a fever or persistent symptoms for more than 2-3 days.   You have a fever and your symptoms suddenly get worse.   You are dehydrated.  MAKE SURE YOU:  Understand these instructions.  Will watch your condition.  Will get help right away if you are not doing well or get worse.   This information is not intended to replace advice given to you by your health care provider. Make sure you discuss any questions you have with your health care provider.   Document Released: 03/28/2004 Document Revised: 10/21/2012 Document Reviewed: 08/14/2012 Elsevier Interactive Patient Education 2016 [employer] Inc[employer]  

## 2015-06-01 NOTE — Progress Notes (Signed)
Subjective:  Patient ID: Patricia Davies, female    DOB: 1945-04-01  Age: 70 y.o. MRN: [id number]  CC: Numbness   HPI Patricia Davies presents for follow-up and complains of worsening numbness, tingling and incoordination in both hands. This is more prominent on the right than the left. She states it's getting so severe that she is having trouble gripping items and has dropped a few things. She is a very poor historian but from what I can gather she is not receiving any kind of a B12 supplement to treat her B12 deficiency. She denies neck or upper extremity pain. She has had no numbness weakness or tingling in legs or feet. She denies back pain.  Outpatient Prescriptions Prior to Visit  Medication Sig Dispense Refill  . methocarbamol (ROBAXIN) 500 MG tablet Take 1 tablet (500 mg total) by mouth 2 (two) times daily as needed for muscle spasms. 20 tablet 0  . omeprazole (PRILOSEC) 40 MG capsule Take 1 capsule (40 mg total) by mouth daily. 90 capsule 3  . acetaminophen (TYLENOL) 325 MG tablet Take 2 tablets (650 mg total) by mouth every 6 (six) hours as needed for mild pain or moderate pain. (Patient not taking: Reported on 06/01/2015) 30 tablet 0   No facility-administered medications prior to visit.    ROS Review of Systems  Constitutional: Negative for fever, chills, diaphoresis, appetite change and fatigue.  HENT: Negative.  Negative for trouble swallowing.   Eyes: Negative.  Negative for visual disturbance.  Respiratory: Negative.  Negative for cough, choking, chest tightness, shortness of breath and stridor.   Cardiovascular: Negative.  Negative for chest pain, palpitations and leg swelling.  Gastrointestinal: Negative.  Negative for nausea, vomiting, abdominal pain, diarrhea and constipation.  Endocrine: Negative.   Genitourinary: Negative.  Negative for dysuria, urgency, hematuria, flank pain, decreased urine volume and difficulty urinating.  Musculoskeletal: Negative.  Negative for  myalgias, back pain, joint swelling and neck pain.  Skin: Negative.  Negative for color change, pallor and rash.  Allergic/Immunologic: Negative.   Neurological: Positive for weakness and numbness. Negative for dizziness, tremors, seizures, syncope, facial asymmetry, speech difficulty, light-headedness and headaches.  Hematological: Negative.  Negative for adenopathy. Does not bruise/bleed easily.  Psychiatric/Behavioral: Negative.     Objective:  BP 130/70 mmHg  Pulse 68  Temp(Src) 97.9 F (36.6 C) (Oral)  Resp 16  Ht  (1.549 m)  Wt 127 lb (57.607 kg)  BMI 24.01 kg/m2  SpO2 98%  BP Readings from Last 3 Encounters:  06/01/15 130/70  03/16/15 144/62  10/18/14 160/74    Wt Readings from Last 3 Encounters:  06/01/15 127 lb (57.607 kg)  10/18/14 134 lb (60.782 kg)  10/03/14 134 lb 8 oz (61.009 kg)    Physical Exam  Constitutional: She is oriented to person, place, and time. She appears well-developed and well-nourished. No distress.  HENT:  Head: Normocephalic and atraumatic.  Mouth/Throat: Oropharynx is clear and moist. No oropharyngeal exudate.  Eyes: Conjunctivae are normal. Right eye exhibits no discharge. Left eye exhibits no discharge. No scleral icterus.  Neck: Normal range of motion. Neck supple. No JVD present. No tracheal deviation present. No thyromegaly present.  Cardiovascular: Normal rate, regular rhythm, normal heart sounds and intact distal pulses.  Exam reveals no gallop and no friction rub.   No murmur heard. Pulmonary/Chest: Effort normal and breath sounds normal. No stridor. No respiratory distress. She has no wheezes. She has no rales. She exhibits no tenderness.  Abdominal: Soft. Bowel  sounds are normal. She exhibits no distension and no mass. There is no tenderness. There is no rebound and no guarding.  Musculoskeletal: Normal range of motion. She exhibits no edema or tenderness.  Lymphadenopathy:    She has no cervical adenopathy.  Neurological:  She is alert and oriented to person, place, and time. She has normal strength. She displays no atrophy, no tremor and normal reflexes. No cranial nerve deficit or sensory deficit. She exhibits normal muscle tone. She displays a negative Romberg sign. She displays no seizure activity. Coordination and gait normal.  Reflex Scores:      Tricep reflexes are 0 on the right side and 0 on the left side.      Bicep reflexes are 0 on the right side and 0 on the left side.      Brachioradialis reflexes are 0 on the right side and 0 on the left side.      Patellar reflexes are 1+ on the right side and 1+ on the left side.      Achilles reflexes are 0 on the right side and 0 on the left side. EKG ---  Sinus  Bradycardia  -With rate variation  cv = 12. WITHIN NORMAL LIMITS  Skin: Skin is warm and dry. No rash noted. She is not diaphoretic. No erythema. No pallor.  Vitals reviewed.   Lab Results  Component Value Date   WBC 6.8 06/01/2015   HGB 12.5 06/01/2015   HCT 37.6 06/01/2015   PLT 274.0 06/01/2015   GLUCOSE 85 06/01/2015   ALT 9 06/01/2015   AST 12 06/01/2015   NA 143 06/01/2015   K 3.8 06/01/2015   CL 106 06/01/2015   CREATININE 0.71 06/01/2015   BUN 14 06/01/2015   CO2 32 06/01/2015   TSH 1.123 12/15/2010   INR 1.0 09/15/2014   HGBA1C 4.9 06/01/2015    No results found.  Assessment & Plan:   Patricia Davies was seen today for numbness.  Diagnoses and all orders for this visit:  Sarcoidosis [facility])- Her EKG shows mild sinus bradycardia with rate variation but no significant dysrhythmia to be suspicious for sarcoidosis of the heart, her calcium level is also normal, will continue to monitor. -     Comprehensive metabolic panel; Future -     EKG 12-Lead  Chronic obstructive pulmonary disease, unspecified COPD type (HCC)  Paresthesias- the most likely cause for the paresthesias is the B12 and folate deficiency, I have ordered a NCS-EMG to screen for other causes of paresthesias such as  sarcoid neuropathy, cervical radiculopathy, demyelination, small fiber disease. -     Ambulatory referral to Neurology  Vitamin B12 deficiency neuropathy- her B12 level is still low so I think this is the most likely cause for her paresthesias, she is not willing to come in for monthly B12 injections so will try high dose oral supplementation. Will also replace the folate deficiency. -     CBC with Differential/Platelet; Future -     Vitamin B12; Future -     Folate; Future -     cyanocobalamin 2000 MCG tablet; Take 1 tablet (2,000 mcg total) by mouth daily.  Essential hypertension- her blood pressure is well-controlled, electrolytes and renal function are stable. -     Comprehensive metabolic panel; Future  Hyperglycemia- improvement noted -     Hemoglobin A1c; Future  Dietary folate deficiency anemia- will replace this with oral folate supplement. -     folic acid (FOLVITE) 1 MG tablet; Take  1 tablet (1 mg total) by mouth daily.   I am having Ms. Tu start on cyanocobalamin and folic acid. I am also having her maintain her omeprazole, acetaminophen, and methocarbamol.  Meds ordered this encounter  Medications  . cyanocobalamin 2000 MCG tablet    Sig: Take 1 tablet (2,000 mcg total) by mouth daily.    Dispense:  90 tablet    Refill:  3  . folic acid (FOLVITE) 1 MG tablet    Sig: Take 1 tablet (1 mg total) by mouth daily.    Dispense:  90 tablet    Refill:  3     Follow-up: Return in about 4 weeks (around 06/29/2015).  Sanda Lingerhomas Celena Lanius, MD

## 2015-07-05 ENCOUNTER — Encounter: Payer: Self-pay | Admitting: *Deleted

## 2015-07-20 ENCOUNTER — Ambulatory Visit (INDEPENDENT_AMBULATORY_CARE_PROVIDER_SITE_OTHER): Payer: Commercial Managed Care - HMO | Admitting: Family

## 2015-07-20 ENCOUNTER — Other Ambulatory Visit (INDEPENDENT_AMBULATORY_CARE_PROVIDER_SITE_OTHER): Payer: Commercial Managed Care - HMO

## 2015-07-20 ENCOUNTER — Encounter: Payer: Self-pay | Admitting: Family

## 2015-07-20 VITALS — BP 98/62 | HR 98 | Temp 97.8°F | Ht 61.0 in | Wt 116.2 lb

## 2015-07-20 DIAGNOSIS — J449 Chronic obstructive pulmonary disease, unspecified: Secondary | ICD-10-CM | POA: Diagnosis not present

## 2015-07-20 DIAGNOSIS — D520 Dietary folate deficiency anemia: Secondary | ICD-10-CM | POA: Diagnosis not present

## 2015-07-20 DIAGNOSIS — E538 Deficiency of other specified B group vitamins: Secondary | ICD-10-CM | POA: Diagnosis not present

## 2015-07-20 DIAGNOSIS — R9431 Abnormal electrocardiogram [ECG] [EKG]: Secondary | ICD-10-CM

## 2015-07-20 DIAGNOSIS — E876 Hypokalemia: Secondary | ICD-10-CM

## 2015-07-20 DIAGNOSIS — R531 Weakness: Secondary | ICD-10-CM | POA: Diagnosis not present

## 2015-07-20 DIAGNOSIS — G63 Polyneuropathy in diseases classified elsewhere: Principal | ICD-10-CM

## 2015-07-20 LAB — BASIC METABOLIC PANEL
BUN: 20 mg/dL (ref 6–23)
CALCIUM: 9.7 mg/dL (ref 8.4–10.5)
CHLORIDE: 96 meq/L (ref 96–112)
CO2: 30 meq/L (ref 19–32)
CREATININE: 0.77 mg/dL (ref 0.40–1.20)
GFR: 95.85 mL/min (ref >60.00)
GLUCOSE: 111 mg/dL — AB (ref 70–99)
Potassium: 3.2 mEq/L — ABNORMAL LOW (ref 3.5–5.1)
Sodium: 138 mEq/L (ref 135–145)

## 2015-07-20 LAB — TROPONIN I: TNIDX: 0 ug/l (ref 0.00–0.06)

## 2015-07-20 MED ORDER — POTASSIUM CHLORIDE CRYS ER 20 MEQ PO TBCR: 20.0000 meq | EXTENDED_RELEASE_TABLET | Freq: Every day | ORAL | Status: DC

## 2015-07-20 MED ORDER — CYANOCOBALAMIN 1000 MCG/ML IJ SOLN: 1000.0000 ug | INTRAMUSCULAR | Status: AC

## 2015-07-20 MED ORDER — ALBUTEROL SULFATE HFA 108 (90 BASE) MCG/ACT IN AERS: 2.0000 | INHALATION_SPRAY | Freq: Four times a day (QID) | RESPIRATORY_TRACT | Status: DC | PRN

## 2015-07-20 MED ORDER — CYANOCOBALAMIN 1000 MCG/ML IJ SOLN
1000.0000 ug | Freq: Once | INTRAMUSCULAR | Status: AC
  Administered 2015-07-20: 1000 ug via INTRAMUSCULAR

## 2015-07-20 NOTE — Progress Notes (Signed)
Pre visit review using our clinic review tool, if applicable. No additional management support is needed unless otherwise documented below in the visit note. 

## 2015-07-20 NOTE — Progress Notes (Signed)
Subjective:    Patient ID: Patricia Davies, female    DOB: 1945/08/28, 70 y.o.   MRN: [id number]   Patricia Davies is a 70 y.o. female who presents today for an acute visit.    HPI Comments: Patient presents for evaluation of lower extremity pain from B12 neuropathy. Has numbness in bilateral hands. 'Has no control when walking'. Believes she should have stayed with b12 injections. Hasnt been taking b12, folate. Patient is poor historian and notes 'chest pain' however unable to say where the say where the pain is.   Daughter accompanies patient with concerns of memory, balance, and inability to pick up things with her hands.   Per chart review, B12 level was 191 one month ago. Folate low at 5.6. Patient has been unwilling to take B12 injectionsagain and PCP has prescribed oral supplements for B12 and folic acid. Her last office visit 05/2015, patient presented for evaluation of worsening numbness, tingling and incoordination in both hands.   Not a smoker. Supposed to wear O2 at home however doesn't per daughter. Notes chronic SOB and cough intermittantly. No wheezing.   Denies numbness or tingling radiating to left arm or jaw, palpitations, dizziness, frequent headaches, changes in vision.   Past Medical History  Diagnosis Date  . Hypertension   . History of nephrolithiasis   . Sarcoidosis of skin (HCC)   . COPD (chronic obstructive pulmonary disease) (HCC)   . Vitamin B12 deficiency    Allergies: Review of patient's allergies indicates no known allergies. Current Outpatient Prescriptions on File Prior to Visit  Medication Sig Dispense Refill  . cyanocobalamin 2000 MCG tablet Take 1 tablet (2,000 mcg total) by mouth daily. 90 tablet 3  . folic acid (FOLVITE) 1 MG tablet Take 1 tablet (1 mg total) by mouth daily. 90 tablet 3  . acetaminophen (TYLENOL) 325 MG tablet Take 2 tablets (650 mg total) by mouth every 6 (six) hours as needed for mild pain or moderate pain. (Patient not taking:  Reported on 07/20/2015) 30 tablet 0  . methocarbamol (ROBAXIN) 500 MG tablet Take 1 tablet (500 mg total) by mouth 2 (two) times daily as needed for muscle spasms. (Patient not taking: Reported on 07/20/2015) 20 tablet 0  . omeprazole (PRILOSEC) 40 MG capsule Take 1 capsule (40 mg total) by mouth daily. 90 capsule 3   No current facility-administered medications on file prior to visit.    Social History  Substance Use Topics  . Smoking status: Former Smoker -- 0.50 packs/day for 20 years    Types: Cigarettes    Quit date: 03/04/1990  . Smokeless tobacco: Never Used     Comment: Regukar exercise - Yes  . Alcohol Use: 0.6 oz/week    1 Cans of beer per week     Comment: social    Review of Systems  Constitutional: Negative for fever and chills.  Respiratory: Positive for cough and shortness of breath.   Cardiovascular: Negative for chest pain and palpitations.  Gastrointestinal: Negative for nausea, vomiting and diarrhea.  Neurological: Positive for dizziness and numbness. Negative for syncope and headaches.      Objective:    BP 98/62 mmHg  Pulse 106  Temp(Src) 97.8 F (36.6 C) (Oral)  Ht 5\' 1"  (1.549 m)  Wt 116 lb 4 oz (52.731 kg)  BMI 21.98 kg/m2  SpO2 89%   Physical Exam  Constitutional: She appears well-developed.  Thin.   HENT:  Mouth/Throat: Uvula is midline, oropharynx is clear and moist and  mucous membranes are normal.  Eyes: Conjunctivae and EOM are normal. Pupils are equal, round, and reactive to light.  Fundus normal bilaterally.   Cardiovascular: Normal rate, regular rhythm, normal heart sounds and normal pulses.   Pulmonary/Chest: Effort normal and breath sounds normal. She has no decreased breath sounds. She has no wheezes. She has no rhonchi. She has no rales.  Neurological: She is alert. She displays tremor. No cranial nerve deficit or sensory deficit. She exhibits abnormal muscle tone. She displays a negative Romberg sign.  Reflex Scores:      Bicep  reflexes are 2+ on the right side and 2+ on the left side.      Patellar reflexes are 2+ on the right side and 2+ on the left side. Grip strength decreased bilateral upper extremities. Gait unsteady. Able to perform finger-to-nose, tremor with movement and missed target twice.   Decreased sensation bilateral dorsal aspect of hands.  Skin: Skin is warm and dry.  Psychiatric: She has a normal mood and affect. Her speech is normal and behavior is normal. Thought content normal.  Vitals reviewed.      Assessment & Plan:  1. Vitamin B12 deficiency neuropathy We are stopping the PO B-12 for next couple of months as patient has not been taking that consistently and she would prefer to try B12 IM. Restarting the first dose today. Patient will come back the next 2 months, June, July for 2 more doses and then resume with PO b12.   - cyanocobalamin (,VITAMIN B-12,) 1000 MCG/ML injection; Inject 1 mL (1,000 mcg total) into the muscle every 30 (thirty) days.  Dispense: 1 mL; Refill: 0  2. Chronic obstructive pulmonary disease, unspecified COPD type (HCC)  Stable. Lung sounds clear. No evidence of bacterial infection or exacerbation at this time. On oxygen and office, SaO2 came up to 98% on 3L.   - albuterol (PROVENTIL HFA) 108 (90 Base) MCG/ACT inhaler; Inhale 2 puffs into the lungs every 6 (six) hours as needed for wheezing or shortness of breath.  Dispense: 1 Inhaler; Refill: 1  3. Dietary folate deficiency anemia Continue PO supplements  - Ambulatory referral to Neurology  4. Weakness generalized Very concerned about the decreased grip strength, and extremity weakness on neurologic exam. Patient also had trouble with performing finger-to-nose. Daughter's concern of memory. Pending MRI of brain. - Referral to neurology - MR Brain W Wo Contrast; Future  5. Abnormal EKG Peaked T waves when compared to EKG 05/2015. Rate is also notably higher today than prior EKG. No ischemia on EKG. Patient  reports a nonspecific chest pain however she is unable to tell us where the pain is. She is well appearing, not diaphoretic or short of breath. No numbness or tingling radiating down entirety of left arm. She is no history of MI. Low suspicion for cardiac etiology. - negative troponins - no hyperkalemia  6. Hypokalemia 3.2. Patients with this degree of hypokalemia usually does not have symptoms which is what I suspect is the case today. The pattern of weakness in hypokalemia usually begins in the lower extremities so I do not suspect the potassium is causing the weakness. EKG does not show classic signs of hypokalemia with ST depression or depression of T waves. Etiology of hypokalemia is nonspecific at this time. Co2 is normal. No renal disease.Patient is not on diuretic therapy, she is not diabetic. No diarrhea. I will replace potassium and recheck BMP  -KLOR-CON 20 meq for 2 weeks and then recheck BMP  I  am having Ms. Fennewald maintain her omeprazole, acetaminophen, methocarbamol, cyanocobalamin, and folic acid.   No orders of the defined types were placed in this encounter.     Start medications as prescribed and explained to patient on After Visit Summary ( AVS). Risks, benefits, and alternatives of the medications and treatment plan prescribed today were discussed, and patient expressed understanding.   Education regarding symptom management and diagnosis given to patient.   Follow-up:Plan follow-up as discussed or as needed if any worsening symptoms or change in condition.   Continue to follow with Sanda Linger, MD for routine health maintenance.   Elmon Else and I agreed with plan.   Rennie Plowman, FNP

## 2015-07-20 NOTE — Patient Instructions (Signed)
Referral to neurology. MRI of brain.  If there is no improvement in your symptoms, or if there is any worsening of symptoms, or if you have any additional concerns, please return for re-evaluation; or, if we are closed, consider going to the Emergency Room for evaluation if symptoms urgent.  Vitamin B12 Deficiency Not having enough vitamin B12 is called a deficiency. Vitamin B12 is an important vitamin. Your body needs vitamin B12 to:   Make red blood cells.  Make DNA. This is the genetic material inside all of your cells.  Help your nerves work properly so they can carry messages from your brain to your body. CAUSES  Not eating enough foods that contain vitamin B12.  Not having enough stomach acid and digestive juices. The body needs these to absorb vitamin B12 from the food you eat.  Having certain digestive system diseases that make it hard to absorb vitamin B12. These diseases include Crohn's disease, chronic pancreatitis, and cystic fibrosis.  Having pernicious anemia, which is a condition where the body has too few red blood cells. People with this condition do not make enough of a protein called "intrinsic factor," which is needed to absorb vitamin B12.  Having a surgery in which part of the stomach or small intestine is removed.  Taking certain medicines that make it hard for the body to absorb vitamin B12. These medicines include:  Heartburn medicine (antacids and proton pump inhibitors).  A certain antibiotic medicine called neomycin, which fights infection.  Some medicines used to treat diabetes, tuberculosis, gout, and high cholesterol. RISK FACTORS Risk factors are things that make you more likely to develop a vitamin B12 deficiency. They include:  Being older than 50.  Being a vegetarian.  Being pregnant and a vegetarian or having a poor diet.  Taking certain drugs.  Being an alcoholic. SYMPTOMS You may have a vitamin B12 deficiency with no symptoms. However,  a vitamin B12 deficiency can cause health problems like anemia and nerve damage. These health problems can lead to many possible symptoms, including:  Weakness.  Fatigue.  Loss of appetite.  Weight loss.  Numbness or tingling in your hands and feet.  Redness and burning of the tongue.  Confusion or memory problems.  Depression.  Dizziness.  Sensory problems, such as loss of taste, color blindness, and ringing in the ears.  Diarrhea or constipation.  Trouble walking. DIAGNOSIS Various types of tests can be given to help find the cause of your vitamin B12 deficiency. These tests include:  A complete blood count (CBC). This test gives your caregiver an overall picture of what makes up your blood.  A blood test to measure your B12 level.  A blood test to measure intrinsic factor.  An endoscopy. This procedure uses a thin tube with a camera on the end to look into your stomach or intestines. TREATMENT Treatment for vitamin B12 deficiency depends on what is causing it. Common options include:  Changing your eating and drinking habits, such as:  Eating more foods that contain vitamin B12.  Not drinking as much alcohol or any alcohol.  Taking vitamin B12 supplements. Your caregiver will tell you what dose is best for you.  Getting vitamin B12 injections. Some people get these a few times a week. Others get them once a month. HOME CARE INSTRUCTIONS  Take all supplements as directed by your caregiver. Follow the directions carefully.  Get any injections your caregiver prescribes. Do not miss your appointments.  Eat lots of healthy foods  that contain vitamin B12. Ask your caregiver if you should work with a nutritionist. Good things to include in your diet are:  Meat.  Poultry.  Fish.  Eggs.  Fortified cereal and dairy products. This means vitamin B12 has been added to the food. Check the label on the package to be sure.  Do not abuse alcohol.  Keep all  follow-up appointments. Your caregiver will need to perform blood tests to make sure your vitamin B12 deficiency is going away. SEEK MEDICAL CARE IF:  You have any questions about your treatment.  Your symptoms come back. MAKE SURE YOU:  Understand these instructions.  Will watch your condition.  Will get help right away if you are not doing well or get worse.   This information is not intended to replace advice given to you by your health care provider. Make sure you discuss any questions you have with your health care provider.   Document Released: 05/13/2011 Document Reviewed: 07/06/2014 Elsevier Interactive Patient Education [employer]2016 [employer]

## 2015-08-01 ENCOUNTER — Encounter: Payer: Self-pay | Admitting: Internal Medicine

## 2015-08-09 ENCOUNTER — Encounter: Payer: Self-pay | Admitting: Family

## 2015-08-18 ENCOUNTER — Other Ambulatory Visit: Payer: Self-pay | Admitting: Family

## 2015-08-21 ENCOUNTER — Ambulatory Visit: Payer: Commercial Managed Care - HMO

## 2015-08-21 ENCOUNTER — Encounter: Payer: Self-pay | Admitting: Family

## 2015-08-21 ENCOUNTER — Telehealth: Payer: Self-pay

## 2015-08-21 NOTE — Telephone Encounter (Signed)
Tried to call patient, no way to leave message, mailbox full--patient had nurse visit scheduled today for b12 injection and we have been trying to reach her about potassium rx sent to her pharm that margaret arnett, NP wanted her to start---it's been several weeks now since rx was sent so if patient didn't pick up rx (last note from pharm call says patient didn't pick up med), then patient needs to go down for potassium lab to see if potassium level is ok---please let patient know that lab needs to be drawn for potassium if she comes in for nurse visit on another day

## 2015-08-22 ENCOUNTER — Ambulatory Visit (INDEPENDENT_AMBULATORY_CARE_PROVIDER_SITE_OTHER): Payer: Commercial Managed Care - HMO

## 2015-08-22 ENCOUNTER — Other Ambulatory Visit: Payer: Self-pay | Admitting: Internal Medicine

## 2015-08-22 ENCOUNTER — Telehealth: Payer: Self-pay

## 2015-08-22 ENCOUNTER — Other Ambulatory Visit (INDEPENDENT_AMBULATORY_CARE_PROVIDER_SITE_OTHER): Payer: Commercial Managed Care - HMO

## 2015-08-22 DIAGNOSIS — E876 Hypokalemia: Secondary | ICD-10-CM | POA: Diagnosis not present

## 2015-08-22 DIAGNOSIS — G63 Polyneuropathy in diseases classified elsewhere: Principal | ICD-10-CM

## 2015-08-22 DIAGNOSIS — E538 Deficiency of other specified B group vitamins: Secondary | ICD-10-CM

## 2015-08-22 LAB — BASIC METABOLIC PANEL
BUN: 9 mg/dL (ref 6–23)
CALCIUM: 9.5 mg/dL (ref 8.4–10.5)
CO2: 33 mEq/L — ABNORMAL HIGH (ref 19–32)
Chloride: 104 mEq/L (ref 96–112)
Creatinine, Ser: 0.69 mg/dL (ref 0.40–1.20)
GFR: 108.76 mL/min (ref >60.00)
Glucose, Bld: 87 mg/dL (ref 70–99)
Potassium: 3.9 mEq/L (ref 3.5–5.1)
SODIUM: 141 meq/L (ref 135–145)

## 2015-08-22 LAB — MAGNESIUM: Magnesium: 2 mg/dL (ref 1.5–2.5)

## 2015-08-22 MED ORDER — CYANOCOBALAMIN 1000 MCG/ML IJ SOLN
1000.0000 ug | Freq: Once | INTRAMUSCULAR | Status: AC
  Administered 2015-08-22: 1000 ug via SUBCUTANEOUS

## 2015-08-22 NOTE — Telephone Encounter (Signed)
Pt is now willing to come in for B12 injections. Came in for one today., How often do you want pt to come in?

## 2015-08-22 NOTE — Telephone Encounter (Signed)
Once a month

## 2015-08-23 NOTE — Telephone Encounter (Signed)
Mailbox full. Will try again.

## 2015-08-24 ENCOUNTER — Other Ambulatory Visit: Payer: Self-pay

## 2015-08-24 ENCOUNTER — Other Ambulatory Visit: Payer: Self-pay | Admitting: Family Medicine

## 2015-08-24 ENCOUNTER — Other Ambulatory Visit: Payer: Self-pay | Admitting: Family

## 2015-08-24 DIAGNOSIS — N63 Unspecified lump in unspecified breast: Secondary | ICD-10-CM

## 2015-08-24 NOTE — Telephone Encounter (Signed)
Notified pt w/MD response. Made her appt for next month for nurse visit for B12...Raechel Chute/lmb

## 2015-08-30 ENCOUNTER — Encounter: Payer: Self-pay | Admitting: Internal Medicine

## 2015-08-30 ENCOUNTER — Ambulatory Visit (INDEPENDENT_AMBULATORY_CARE_PROVIDER_SITE_OTHER): Payer: BC Managed Care – PPO | Admitting: Internal Medicine

## 2015-08-30 VITALS — BP 122/70 | HR 66 | Ht 62.0 in | Wt 115.0 lb

## 2015-08-30 DIAGNOSIS — J449 Chronic obstructive pulmonary disease, unspecified: Secondary | ICD-10-CM | POA: Diagnosis not present

## 2015-08-30 DIAGNOSIS — D869 Sarcoidosis, unspecified: Secondary | ICD-10-CM | POA: Diagnosis not present

## 2015-08-30 MED ORDER — UMECLIDINIUM-VILANTEROL 62.5-25 MCG/INH IN AEPB: INHALATION_SPRAY | RESPIRATORY_TRACT | Status: DC

## 2015-08-30 NOTE — Progress Notes (Signed)
01/31/11- 23 yoF former smoker with history of sarcoid. Followed at this office in 2008 with fibrocystic scarring. Originally diagnosed after abnormal chest x-ray for job physical. Bronchoscopy with biopsy. Treated with prednisone. [facility] with [facility]-acquired pneumonia treated with Avelox and prednisone, urinary infection treated with Bactrim. CT scan of the chest 12/17/2010 showed significant pulmonary scarring and bronchiectasis consistent with sarcoid. No obvious superimposed pneumonia. Stable adenopathy. She has finished her discharge meds. She still feels mild vertigo, positional, but back to baseline with shortness of breath cough and wheeze. Mostly exertional dyspnea on stairs or brisk walking with little change over several years. She denies fever, night sweats, adenopathy or rash. She was discharged from the hospital with oxygen at 2 L/Advanced, but she doubts that she needs it now. An albuterol rescue inhaler helps if she is walking briskly or out in the wind. Had flu vaccine November 2012 in the hospital and has history of pneumonia vaccine. Denies history of cardiac disease or allergy. TB skin test was negative years ago. No exposure.  04/04/11- 65 yoF former smoker, followed for hx sarcoid/ scarring, hx pneumonia Blowing nose more than blames minor cough whether related nasal drip. Denies fever, sore throat, sweat or nodes. Rarely needs her rescue inhaler. PFT: 04/04/2011-moderate obstructive airways disease with insignificant response to dilator diffusion severely reduced c/w emphysema. FEV1/FVC 0.52, DLCO 43%. 6 minut.e walk test: 04/04/2011 97%, dropping to 94% and a block, rebounded to 98% with 2 minutes rest. 312 m  08/23/13 Acute OV  Pt presents for an acute office visit.  Complains of increased cough for last few days, had blood tinged mucus few times over last 3 days .  Dark blood mostly, had one episode of bright red blood yesterday happened. Less today.  Usually  about a 1/2-1 tsp at a time w/ mixed mucus /drainage.  No fever, chest pain , orthopnea , edema or calf pain.  Takes aspirin daily .  CXR today with no acute findings. Chronic changes noted.  Has not been seen in >2 years , was started on tudorza last ov but did not like it.  Uses rescues SABA but rarely needs it.  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  [facility] pt here for hemoptysis.  Started coughing up deep dark red blood Saturday night, up to a tablespoon full at a time.  Pt reports no rash.  We discussed need to keep follow up ov.   10/12/13- 68 yoF former smoker, followed for hx sarcoid/ scarring,COPD,  hx pneumonia FOLLOWS FOR: Pt last seen by [facility] on 03/2011, pt recently seen by [facility] for an acute visit on 08/23/2013. Pt states her breathing has slightly improved since seeing TP. Pt states she has good days and bad days d/t the weather. Pt c/o constant pain in right hip x 2 weeks. Pt c/o DOE and mild dry cough. Pt denies CP/tightness.  CXR 08/23/13 MPRESSION:  Stable severe chronic changes with fibrosis and bronchiectasis in  the upper lobes. Findings compatible with sarcoidosis.  Increasing left apical pleural thickening, likely scarring.  Electronically Signed  By: Charlett Nose M.D.  On: 08/23/2013 14:02  08/30/14- 68 yoF former smoker, followed for hx sarcoid/ scarring,COPD,  hx pneumonia, bronchiectasis and hemoptysis in the past. Original diagnosis 2008 bronchoscopy, treated then with prednisone. ACUTE VISIT: Pt states she has lost feelings in her hands; stays cold, balance is off. Breathing is not that bad just concerned with other issues going on.  ACE 33 01/31/11 Complains now that  for 2 weeks she has had tingling in her hands, glove pattern, "like Brillo". Dropping things. When asked about neck pain she complains of "I hurt all over". Denies syncope, diplopia. Has had previous numbness in her feet but not clear that it is like her hands. 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 Rare need for rescue inhaler with little cough, wheeze or phlegm. Denies fever, night sweats, adenopathy.  08/30/2015-70 year old female former smoker followed for Sarcoid stage IV, COPD, history pneumonia, bronchiectasis and hemoptysis in the past. Original BX 2008 bronchoscopy, treated then with prednisone ACE 08/30/2014-37 CXR 08/30/2014- image reviewed- upward retraction with significant scarring IMPRESSION: Stable pleural and parenchymal scarring in the upper lobes to the apices left-greater-than-right. No active process is seen. Electronically Signed  By: Dwyane DeePaul Barry M.D.  On: 08/30/2014 11:06 FOLLOW FOR: doing well, no concerns.           Daughter here Notices dyspnea on exertion walking without chest pain, swelling or palpitation. Some chest tightness without active wheeze. Albuterol rescue inhaler does help, being used once or twice at least on most days. Daughter mentions memory loss and treatment now with vitamin B12 for peripheral neuropathy  ROS-see HPI Constitutional:   No-   weight loss, night sweats, fevers, chills, fatigue, lassitude. HEENT:   No-  headaches, difficulty swallowing, tooth/dental problems, sore throat,         nasal congestion, post nasal drip,  CV:  No-   chest pain, orthopnea, PND, swelling in lower extremities, anasarca, dizziness, palpitations Resp: + shortness of breath with exertion or at rest.              No-   productive cough,  + non-productive cough,   coughing up of blood.              No-   change in color of mucus.  No- wheezing.   Skin: No-   rash or lesions. GI:  No-   heartburn, indigestion, abdominal pain, nausea, vomiting, diarrhea,                 change in bowel habits, loss of appetite GU:  MS:  No-   joint pain or swelling.  No- decreased range of motion.  No- back pain. Neuro-     Per HPI Psych:  No- change in mood or affect. No depression or anxiety.  No memory loss.  OBJ General- Alert, Oriented,  Affect-appropriate, Distress- none acute, trim Skin- rash-none, lesions- none, excoriation- none Lymphadenopathy- none Head- atraumatic            Eyes- Gross vision intact, PERRLA, conjunctivae ? Pale, clear secretions            Ears- Hearing, canals-normal            Nose- Clear, sniffing, no-Septal dev, mucus, polyps, erosion, perforation             Throat- Mallampati II , mucosa clear , drainage- none, tonsils- atrophic Neck- flexible , trachea midline, no stridor , thyroid nl, carotid no bruit Chest - symmetrical excursion , unlabored           Heart/CV- RRR , no murmur , no gallop  , no rub, nl s1 s2                           - JVD- none , edema- none, stasis changes- none, varices- none  Lung- clear to P&A-Little or no adventitious noise, wheeze- none, cough- none , dullness-none, rub- none           Chest wall-  Abd- Br/ Gen/ Rectal- Not done, not indicated Extrem- cyanosis- none, clubbing, none, atrophy- none, strength- nl Neuro- grossly intact to observation, grip seems normal

## 2015-08-30 NOTE — Patient Instructions (Addendum)
Sample and print script for Anoro Ellipta inhaler    Inhale 1 puff, once daily    If this seems to help your breathing, then you can get the script filled and stay on it  [facility] also use the albuterol rescue inhaler from time to time in addition, if needed  Please call as needed

## 2015-08-31 ENCOUNTER — Other Ambulatory Visit: Payer: Self-pay | Admitting: Family

## 2015-08-31 ENCOUNTER — Other Ambulatory Visit: Payer: Self-pay

## 2015-08-31 DIAGNOSIS — N63 Unspecified lump in unspecified breast: Secondary | ICD-10-CM

## 2015-09-01 ENCOUNTER — Other Ambulatory Visit: Payer: Commercial Managed Care - HMO

## 2015-09-08 ENCOUNTER — Other Ambulatory Visit: Payer: Commercial Managed Care - HMO

## 2015-09-18 NOTE — Telephone Encounter (Signed)
Potassium normal, 3.9 at last visit.

## 2015-09-19 NOTE — Assessment & Plan Note (Signed)
Based on ACE level and symptoms, sarcoid appears to be in long-term clinical remission

## 2015-09-19 NOTE — Assessment & Plan Note (Signed)
Modest increase dyspnea with exertion. Rescue inhaler seems to help. Plan-try adding a maintenance controller- Anoro

## 2015-09-20 ENCOUNTER — Ambulatory Visit: Payer: Commercial Managed Care - HMO

## 2015-09-22 ENCOUNTER — Ambulatory Visit (INDEPENDENT_AMBULATORY_CARE_PROVIDER_SITE_OTHER): Payer: Commercial Managed Care - HMO

## 2015-09-22 DIAGNOSIS — E538 Deficiency of other specified B group vitamins: Secondary | ICD-10-CM

## 2015-09-22 MED ORDER — CYANOCOBALAMIN 1000 MCG/ML IJ SOLN
1000.0000 ug | Freq: Once | INTRAMUSCULAR | Status: AC
  Administered 2015-09-22: 1000 ug via INTRAMUSCULAR

## 2015-10-23 ENCOUNTER — Ambulatory Visit: Payer: Commercial Managed Care - HMO | Admitting: Internal Medicine

## 2015-10-23 ENCOUNTER — Telehealth: Payer: Self-pay | Admitting: Internal Medicine

## 2015-10-23 NOTE — Telephone Encounter (Signed)
Patient no showed on 8/21 for follow up and B12.  Please advise.

## 2015-10-27 ENCOUNTER — Ambulatory Visit (INDEPENDENT_AMBULATORY_CARE_PROVIDER_SITE_OTHER): Payer: Commercial Managed Care - HMO

## 2015-10-27 DIAGNOSIS — E538 Deficiency of other specified B group vitamins: Secondary | ICD-10-CM | POA: Diagnosis not present

## 2015-10-27 DIAGNOSIS — G63 Polyneuropathy in diseases classified elsewhere: Principal | ICD-10-CM

## 2015-10-27 MED ORDER — CYANOCOBALAMIN 1000 MCG/ML IJ SOLN
1000.0000 ug | Freq: Once | INTRAMUSCULAR | Status: AC
  Administered 2015-10-27: 1000 ug via INTRAMUSCULAR

## 2015-10-27 NOTE — Progress Notes (Signed)
Medical treatment/procedure(s) were performed by non-physician practitioner and as supervising physician I was immediately available for consultation/collaboration. I agree with above. Shayne Diguglielmo A Adana Marik, MD  

## 2016-04-26 ENCOUNTER — Ambulatory Visit (INDEPENDENT_AMBULATORY_CARE_PROVIDER_SITE_OTHER): Payer: Commercial Managed Care - HMO | Admitting: Internal Medicine

## 2016-04-26 ENCOUNTER — Encounter: Payer: Self-pay | Admitting: Internal Medicine

## 2016-04-26 VITALS — BP 130/80 | HR 57 | Ht 62.0 in | Wt 122.4 lb

## 2016-04-26 DIAGNOSIS — R202 Paresthesia of skin: Secondary | ICD-10-CM

## 2016-04-26 DIAGNOSIS — D869 Sarcoidosis, unspecified: Secondary | ICD-10-CM

## 2016-04-26 DIAGNOSIS — G63 Polyneuropathy in diseases classified elsewhere: Secondary | ICD-10-CM

## 2016-04-26 DIAGNOSIS — E538 Deficiency of other specified B group vitamins: Secondary | ICD-10-CM

## 2016-04-26 NOTE — Assessment & Plan Note (Signed)
She may be describing carpal tunnel problems. I doubt sarcoid involving nerves, which was her concern. She also has a history of B12 deficiency. Plan-I offered to refer to neurology

## 2016-04-26 NOTE — Assessment & Plan Note (Signed)
I would not expect active systemic sarcoid at this age. Plan-CXR, ACE level

## 2016-04-26 NOTE — Patient Instructions (Addendum)
Order- lab ACE level  Dx Sarcoid               CXR   [facility]    Dx paresthesias in hands, hx sarcoid, B12 deficiency

## 2016-04-26 NOTE — Progress Notes (Signed)
HPI female former smoker followed for Sarcoid stage IV, COPD, history pneumonia, bronchiectasis and hemoptysis in the past. Original BX 2008 bronchoscopy, treated then with prednisone PFT: 04/04/2011-moderate obstructive airways disease with insignificant response to dilator diffusion severely reduced c/w emphysema. FEV1/FVC 0.52, DLCO 43%. 6 minut.e walk test: 04/04/2011 97%, dropping to 94% and a block, rebounded to 98% with 2 minutes rest. 312 m ACE 33 01/31/11 ACE 08/30/2014-37  ---------------------------------------------------------------------------------------------------  08/30/2015-71 year old female former smoker followed for Sarcoid stage IV, COPD, history pneumonia, bronchiectasis and hemoptysis in the past. Original BX 2008 bronchoscopy, treated then with prednisone ACE 08/30/2014-37 CXR 08/30/2014- image reviewed- upward retraction with significant scarring IMPRESSION: Stable pleural and parenchymal scarring in the upper lobes to the apices left-greater-than-right. No active process is seen. Electronically Signed  By: Dwyane Dee M.D.  On: 08/30/2014 11:06 FOLLOW FOR: doing well, no concerns.           Daughter here Notices dyspnea on exertion walking without chest pain, swelling or palpitation. Some chest tightness without active wheeze. Albuterol rescue inhaler does help, being used once or twice at least on most days. Daughter mentions memory loss and treatment now with vitamin B12 for peripheral neuropathy  04/26/2016-71 year old female former smoker followed for Sarcoid stage IV, COPD, history pneumonia, bronchiectasis and hemoptysis, complicated by HBP Original BX 2008 bronchoscopy, treated then with prednisone FOLLOWS FOR: Pt wanted to follow up for sarcoid-having trouble with pain in chest/lung area and fingers.  She came with grandson today, still concerned about paresthesias in hands "like sandpaper". Also describes a sharp twinge of pain felt across the right  anterior chest wall sometimes if lying in bed and turns to the right. Denies fever, sweat, adenopathy, rash, shortness of breath or cough. Denies neck pain. Sometimes right hand cramps.  ROS-see HPI   + = pos Constitutional:   No-   weight loss, night sweats, fevers, chills, fatigue, lassitude. HEENT:   No-  headaches, difficulty swallowing, tooth/dental problems, sore throat,         nasal congestion, post nasal drip,  CV:  No-   chest pain, orthopnea, PND, swelling in lower extremities, anasarca, dizziness, palpitations Resp:  shortness of breath with exertion or at rest.              No-   productive cough,   non-productive cough,   coughing up of blood.              No-   change in color of mucus.  No- wheezing.   Skin: No-   rash or lesions. GI:  No-   heartburn, indigestion, abdominal pain, nausea, vomiting, diarrhea,                 change in bowel habits, loss of appetite GU:  MS:  No-   joint pain or swelling.  No- decreased range of motion.  No- back pain. Neuro-     + Per HPI Psych:  No- change in mood or affect. No depression or anxiety.  No memory loss.  OBJ General- Alert, Oriented, Affect-appropriate, Distress- none acute, trim Skin- rash-none, lesions- none, excoriation- none Lymphadenopathy- none Head- atraumatic            Eyes- Gross vision intact, PERRLA, conjunctivae             Ears- Hearing, canals-normal            Nose- Clear, no-Septal dev, mucus, polyps, erosion, perforation  Throat- Mallampati II , mucosa clear , drainage- none, tonsils- atrophic Neck- flexible , trachea midline, no stridor , thyroid nl, carotid no bruit Chest - symmetrical excursion , unlabored           Heart/CV- RRR , no murmur , no gallop  , no rub, nl s1 s2                           - JVD- none , edema- none, stasis changes- none, varices- none           Lung- clear, wheeze- none, cough- none , dullness-none, rub- none           Chest wall-  Abd- Br/ Gen/ Rectal- Not done,  not indicated Extrem- cyanosis- none, clubbing, none, atrophy- none, strength- nl Neuro- grossly intact to observation, grip seems normal

## 2016-07-31 ENCOUNTER — Telehealth: Payer: Self-pay | Admitting: Pulmonary Disease

## 2016-08-01 ENCOUNTER — Ambulatory Visit (INDEPENDENT_AMBULATORY_CARE_PROVIDER_SITE_OTHER): Payer: Medicare Other | Admitting: Adult Health

## 2016-08-01 ENCOUNTER — Encounter: Payer: Self-pay | Admitting: Adult Health

## 2016-08-01 VITALS — BP 116/70 | HR 107 | Ht 62.0 in | Wt 115.6 lb

## 2016-08-01 DIAGNOSIS — E538 Deficiency of other specified B group vitamins: Secondary | ICD-10-CM | POA: Diagnosis not present

## 2016-08-01 DIAGNOSIS — R000 Tachycardia, unspecified: Secondary | ICD-10-CM | POA: Insufficient documentation

## 2016-08-01 DIAGNOSIS — J449 Chronic obstructive pulmonary disease, unspecified: Secondary | ICD-10-CM | POA: Diagnosis not present

## 2016-08-01 DIAGNOSIS — G63 Polyneuropathy in diseases classified elsewhere: Secondary | ICD-10-CM

## 2016-08-01 MED ORDER — UMECLIDINIUM-VILANTEROL 62.5-25 MCG/INH IN AEPB: INHALATION_SPRAY | RESPIRATORY_TRACT | 5 refills | Status: DC

## 2016-08-01 MED ORDER — DOXYCYCLINE HYCLATE 100 MG PO TABS
100.0000 mg | ORAL_TABLET | Freq: Two times a day (BID) | ORAL | 0 refills | Status: DC
Start: 2016-08-01 — End: 2016-08-07

## 2016-08-01 NOTE — Progress Notes (Signed)
 @Patient  ID: Patricia Davies, female    DOB: Jan 04, 1946, 71 y.o.   MRN: [id number]  Chief Complaint  Patient presents with  . Follow-up    Sarcoid     Referring provider: Etta GrandchildJones, Thomas L, MD  HPI: 71 year old female former smoker followed for stage IV sarcoid, COPD and bronchiectasis. Patient has history of intermittent hemoptysis with bronchoscopy in 2008  08/01/2016 Acute OV : Sarcoid /COPD  Pt presents for an acute office visit. Complains of 1 week of cough, congestion with thick yellow mucus.  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  [facility] but not taking on regular basis .  Daughter is concerned she is not eating well and has lost weight aroung 5-10 lbs.   HR is up today 100 to 110 . No chest pain , orthopnea, palpitations, or syncope.  EKG with Sinus Tachycardia with HR at 114.      No Known Allergies  Immunization History  Administered Date(s) Administered  . Influenza Split 12/03/2010, 12/02/2012, 12/02/2013  . Td 03/04/2009    Past Medical History:  Diagnosis Date  . COPD (chronic obstructive pulmonary disease) (HCC)   . History of nephrolithiasis   . Hypertension   . Sarcoidosis of skin   . Vitamin B12 deficiency     Tobacco History: History  Smoking Status  . Former Smoker  . Packs/day: 0.50  . Years: 20.00  . Types: Cigarettes  . Quit date: 03/04/1990  Smokeless Tobacco  . Never Used    Comment: Regukar exercise - Yes   Counseling given: Not Answered   Outpatient Encounter Prescriptions as of 08/01/2016  Medication Sig  . acetaminophen (TYLENOL) 325 MG tablet Take 2 tablets (650 mg total) by mouth every 6 (six) hours as needed for mild pain or moderate pain.  Marland Kitchen. albuterol (PROVENTIL HFA) 108 (90 Base) MCG/ACT inhaler Inhale 2 puffs into the lungs every 6 (six) hours as needed for wheezing or shortness of breath.  . folic acid (FOLVITE) 1 MG tablet Take 1 tablet (1 mg total) by mouth daily.  . Multiple  Vitamins-Minerals (WOMENS 50+ MULTI VITAMIN/MIN PO) Take by mouth.  . vitamin B-12 (CYANOCOBALAMIN) 1000 MCG tablet Take 1,000 mcg by mouth daily.  Marland Kitchen. doxycycline (VIBRA-TABS) 100 MG tablet Take 1 tablet (100 mg total) by mouth 2 (two) times daily.  Marland Kitchen. umeclidinium-vilanterol (ANORO ELLIPTA) 62.5-25 MCG/INH AEPB Inhale 1 puff, once daily- maintenance (Patient not taking: Reported on 08/01/2016)   No facility-administered encounter medications on file as of 08/01/2016.      Review of Systems  Constitutional:   No  weight loss, night sweats,  Fevers, chills,  +fatigue, or  lassitude.  HEENT:   No headaches,  Difficulty swallowing,  Tooth/dental problems, or  Sore throat,                No sneezing, itching, ear ache,  +asal congestion, post nasal drip,   CV:  No chest pain,  Orthopnea, PND, swelling in lower extremities, anasarca, dizziness, palpitations, syncope.   GI  No heartburn, indigestion, abdominal pain, nausea, vomiting, diarrhea, change in bowel habits, loss of appetite, bloody stools.   Resp:  .  No chest wall deformity  Skin: no rash or lesions.  GU: no dysuria, change in color of urine, no urgency or frequency.  No flank pain, no hematuria   MS:  No joint pain or swelling.  No decreased range of motion.  No  back pain.    Physical Exam  BP 116/70 (BP Location: Right Arm, Cuff Size: Normal)   Pulse (!) 107   Ht 5\' 2"  (1.575 m)   Wt 115 lb 9.6 oz (52.4 kg)   SpO2 94%   BMI 21.14 kg/m   GEN: A/Ox3; pleasant , NAD, thin female    HEENT:  [place]/AT,  EACs-clear, TMs-wnl, NOSE-clear, THROAT-clear, no lesions, no postnasal drip or exudate noted.   NECK:  Supple w/ fair ROM; no JVD; normal carotid impulses w/o bruits; no thyromegaly or nodules palpated; no lymphadenopathy.    RESP  Clear  P & A; w/o, wheezes/ rales/ or rhonchi. no accessory muscle use, no dullness to percussion  CARD:  RRR, no m/r/g, no peripheral edema, pulses intact, no cyanosis or clubbing.  GI:    Soft & nt; nml bowel sounds; no organomegaly or masses detected.   Musco: Warm bil, no deformities or joint swelling noted.   Neuro: alert, no focal deficits noted.    Skin: Warm, no lesions or rashes    Lab Results:  CBC  BMET  No results found.   Assessment & Plan:   COPD mixed type (HCC) Flare with URI/Bronchitis   Plan Check cxr  Doxycyclnie x 1 week .  Mucinex DM Twice daily  As needed  Cough and congestion .    Tachycardia Sinus tachycardia  -asx w/ associated COPD flare  EKG with no acute changes.  Discussed case and EKG review with Dr. Maple Hudson  .  Check cxr today .  follow up with PCP .  Please contact office for sooner follow up if symptoms do not improve or worsen or seek emergency care    Vitamin B12 deficiency neuropathy Follow up with PCP .      Rubye Oaks, NP 08/01/2016

## 2016-08-01 NOTE — Patient Instructions (Addendum)
Set up with Primary MD for routine check up  And to follow for B12 deficiency /memory issues .  Doxcycline 100mg  Twice daily  , take with food .  Mucinex DM Twice daily  As needed  Cough/congestion  Restart ANORO daily  Rinse after use.  follow up Dr. Maple HudsonYoung  In 6-8 weeks and As needed   Please contact office for sooner follow up if symptoms do not improve or worsen or seek emergency care

## 2016-08-01 NOTE — Telephone Encounter (Signed)
Entered in error. Nothing in the phone note.  Will sign off

## 2016-08-01 NOTE — Addendum Note (Signed)
Addended by: Boone MasterJONES, JESSICA E on: 08/01/2016 12:48 PM   Modules accepted: Orders

## 2016-08-01 NOTE — Assessment & Plan Note (Addendum)
Sinus tachycardia  -asx w/ associated COPD flare  EKG with no acute changes.  Discussed case and EKG review with Dr. Maple HudsonYoung  .  Check cxr today .  follow up with PCP .  Please contact office for sooner follow up if symptoms do not improve or worsen or seek emergency care

## 2016-08-01 NOTE — Assessment & Plan Note (Signed)
Flare with URI/Bronchitis   Plan Check cxr  Doxycyclnie x 1 week .  Mucinex DM Twice daily  As needed  Cough and congestion .

## 2016-08-01 NOTE — Assessment & Plan Note (Signed)
Follow up with PCP

## 2016-08-07 ENCOUNTER — Ambulatory Visit (INDEPENDENT_AMBULATORY_CARE_PROVIDER_SITE_OTHER)
Admission: RE | Admit: 2016-08-07 | Discharge: 2016-08-07 | Disposition: A | Discharge status: Home / Self Care | Payer: Medicare Other | Source: Ambulatory Visit | Attending: Internal Medicine | Admitting: Internal Medicine

## 2016-08-07 ENCOUNTER — Other Ambulatory Visit (INDEPENDENT_AMBULATORY_CARE_PROVIDER_SITE_OTHER): Payer: Medicare Other

## 2016-08-07 ENCOUNTER — Encounter: Payer: Self-pay | Admitting: Internal Medicine

## 2016-08-07 ENCOUNTER — Ambulatory Visit (INDEPENDENT_AMBULATORY_CARE_PROVIDER_SITE_OTHER): Payer: Medicare Other | Admitting: Internal Medicine

## 2016-08-07 VITALS — BP 104/64 | HR 94 | Temp 98.7°F | Resp 16 | Ht 62.0 in | Wt 115.5 lb

## 2016-08-07 DIAGNOSIS — Z1159 Encounter for screening for other viral diseases: Secondary | ICD-10-CM | POA: Insufficient documentation

## 2016-08-07 DIAGNOSIS — D539 Nutritional anemia, unspecified: Secondary | ICD-10-CM

## 2016-08-07 DIAGNOSIS — R059 Cough, unspecified: Secondary | ICD-10-CM

## 2016-08-07 DIAGNOSIS — R938 Abnormal findings on diagnostic imaging of other specified body structures: Secondary | ICD-10-CM

## 2016-08-07 DIAGNOSIS — E538 Deficiency of other specified B group vitamins: Secondary | ICD-10-CM | POA: Diagnosis not present

## 2016-08-07 DIAGNOSIS — I10 Essential (primary) hypertension: Secondary | ICD-10-CM

## 2016-08-07 DIAGNOSIS — R9389 Abnormal findings on diagnostic imaging of other specified body structures: Secondary | ICD-10-CM

## 2016-08-07 DIAGNOSIS — E785 Hyperlipidemia, unspecified: Secondary | ICD-10-CM | POA: Insufficient documentation

## 2016-08-07 DIAGNOSIS — E876 Hypokalemia: Secondary | ICD-10-CM | POA: Diagnosis not present

## 2016-08-07 DIAGNOSIS — G63 Polyneuropathy in diseases classified elsewhere: Secondary | ICD-10-CM

## 2016-08-07 DIAGNOSIS — D520 Dietary folate deficiency anemia: Secondary | ICD-10-CM | POA: Diagnosis not present

## 2016-08-07 DIAGNOSIS — R05 Cough: Secondary | ICD-10-CM

## 2016-08-07 DIAGNOSIS — R413 Other amnesia: Secondary | ICD-10-CM

## 2016-08-07 DIAGNOSIS — Z23 Encounter for immunization: Secondary | ICD-10-CM

## 2016-08-07 LAB — COMPREHENSIVE METABOLIC PANEL
ALBUMIN: 3.5 g/dL (ref 3.5–5.2)
ALT: 29 U/L (ref 0–35)
AST: 19 U/L (ref 0–37)
Alkaline Phosphatase: 61 U/L (ref 39–117)
BUN: 16 mg/dL (ref 6–23)
CHLORIDE: 100 meq/L (ref 96–112)
CO2: 36 mEq/L — ABNORMAL HIGH (ref 19–32)
Calcium: 9.9 mg/dL (ref 8.4–10.5)
Creatinine, Ser: 0.81 mg/dL (ref 0.40–1.20)
GFR: 90.13 mL/min (ref >60.00)
Glucose, Bld: 118 mg/dL — ABNORMAL HIGH (ref 70–99)
Potassium: 3.2 mEq/L — ABNORMAL LOW (ref 3.5–5.1)
Sodium: 143 mEq/L (ref 135–145)
Total Bilirubin: 0.5 mg/dL (ref 0.2–1.2)
Total Protein: 7.8 g/dL (ref 6.0–8.3)

## 2016-08-07 LAB — CBC WITH DIFFERENTIAL/PLATELET
BASOS PCT: 1.1 % (ref 0.0–3.0)
Basophils Absolute: 0.1 10*3/uL (ref 0.0–0.1)
EOS ABS: 0.2 10*3/uL (ref 0.0–0.7)
Eosinophils Relative: 1.8 % (ref 0.0–5.0)
HCT: 30.9 % — ABNORMAL LOW (ref 36.0–46.0)
Hemoglobin: 10.1 g/dL — ABNORMAL LOW (ref 12.0–15.0)
Lymphocytes Relative: 28 % (ref 12.0–46.0)
Lymphs Abs: 3.3 10*3/uL (ref 0.7–4.0)
MCHC: 32.6 g/dL (ref 30.0–36.0)
MCV: 92 fl (ref 78.0–100.0)
MONO ABS: 1.4 10*3/uL — AB (ref 0.1–1.0)
Monocytes Relative: 12.1 % — ABNORMAL HIGH (ref 3.0–12.0)
NEUTROS ABS: 6.7 10*3/uL (ref 1.4–7.7)
Neutrophils Relative %: 57 % (ref 43.0–77.0)
PLATELETS: 670 10*3/uL — AB (ref 150.0–400.0)
RBC: 3.36 Mil/uL — ABNORMAL LOW (ref 3.87–5.11)
RDW: 14.7 % (ref 11.5–15.5)
WBC: 11.8 10*3/uL — AB (ref 4.0–10.5)

## 2016-08-07 LAB — FOLATE: FOLATE: 7.6 ng/mL (ref >5.9)

## 2016-08-07 LAB — LIPID PANEL
CHOLESTEROL: 116 mg/dL (ref 0–200)
HDL: 25.8 mg/dL — ABNORMAL LOW (ref >39.00)
LDL CALC: 59 mg/dL (ref 0–99)
NonHDL: 90.29
Total CHOL/HDL Ratio: 4
Triglycerides: 156 mg/dL — ABNORMAL HIGH (ref 0.0–149.0)
VLDL: 31.2 mg/dL (ref 0.0–40.0)

## 2016-08-07 LAB — TSH: TSH: 0.9 u[IU]/mL (ref 0.35–4.50)

## 2016-08-07 LAB — VITAMIN B12: Vitamin B-12: >1500 pg/mL — ABNORMAL HIGH (ref 211–911)

## 2016-08-07 NOTE — Patient Instructions (Signed)
Cough, Adult Coughing is a reflex that clears your throat and your airways. Coughing helps to heal and protect your lungs. It is normal to cough occasionally, but a cough that happens with other symptoms or lasts a long time may be a sign of a condition that needs treatment. A cough may last only 2-3 weeks (acute), or it may last longer than 8 weeks (chronic). What are the causes? Coughing is commonly caused by:  Breathing in substances that irritate your lungs.  A viral or bacterial respiratory infection.  Allergies.  Asthma.  Postnasal drip.  Smoking.  Acid backing up from the stomach into the esophagus (gastroesophageal reflux).  Certain medicines.  Chronic lung problems, including COPD (or rarely, lung cancer).  Other medical conditions such as heart failure.  Follow these instructions at home: Pay attention to any changes in your symptoms. Take these actions to help with your discomfort:  Take medicines only as told by your health care provider. ? If you were prescribed an antibiotic medicine, take it as told by your health care provider. Do not stop taking the antibiotic even if you start to feel better. ? Talk with your health care provider before you take a cough suppressant medicine.  Drink enough fluid to keep your urine clear or pale yellow.  If the air is dry, use a cold steam vaporizer or humidifier in your bedroom or your home to help loosen secretions.  Avoid anything that causes you to cough at work or at home.  If your cough is worse at night, try sleeping in a semi-upright position.  Avoid cigarette smoke. If you smoke, quit smoking. If you need help quitting, ask your health care provider.  Avoid caffeine.  Avoid alcohol.  Rest as needed.  Contact a health care provider if:  You have new symptoms.  You cough up pus.  Your cough does not get better after 2-3 weeks, or your cough gets worse.  You cannot control your cough with suppressant  medicines and you are losing sleep.  You develop pain that is getting worse or pain that is not controlled with pain medicines.  You have a fever.  You have unexplained weight loss.  You have night sweats. Get help right away if:  You cough up blood.  You have difficulty breathing.  Your heartbeat is very fast. This information is not intended to replace advice given to you by your health care provider. Make sure you discuss any questions you have with your health care provider. Document Released: 08/17/2010 Document Revised: 07/27/2015 Document Reviewed: 04/27/2014 Elsevier Interactive Patient Education  2017 [employer]  

## 2016-08-07 NOTE — Progress Notes (Signed)
Subjective:  Patient ID: Patricia Davies, female    DOB: 1945-09-02  Age: 71 y.o. MRN: [id number]  CC: Cough   HPI DEIJAH SPIKES presents for concerns about multiple issues. She is with her daughter today and her daughter seems very stressed and frustrated. The patient complains of a one-week history of cough productive of yellow phlegm. It appears she saw someone in pulmonology about a week ago and was treated with doxycycline. There has been a history of weight loss but she denies chest pain, hemoptysis, fever, chills, or night sweats.  There is also concerned about memory loss and decreased performance in ADLs such as cooking and grocery shopping. She does not drive anymore. She complains of worsening numbness in her fingers.  Outpatient Medications Prior to Visit  Medication Sig Dispense Refill  . umeclidinium-vilanterol (ANORO ELLIPTA) 62.5-25 MCG/INH AEPB Inhale 1 puff, once daily- maintenance 60 each 5  . folic acid (FOLVITE) 1 MG tablet Take 1 tablet (1 mg total) by mouth daily. (Patient not taking: Reported on 08/07/2016) 90 tablet 3  . vitamin B-12 (CYANOCOBALAMIN) 1000 MCG tablet Take 1,000 mcg by mouth daily.    Marland Kitchen acetaminophen (TYLENOL) 325 MG tablet Take 2 tablets (650 mg total) by mouth every 6 (six) hours as needed for mild pain or moderate pain. (Patient not taking: Reported on 08/07/2016) 30 tablet 0  . albuterol (PROVENTIL HFA) 108 (90 Base) MCG/ACT inhaler Inhale 2 puffs into the lungs every 6 (six) hours as needed for wheezing or shortness of breath. (Patient not taking: Reported on 08/07/2016) 1 Inhaler 1  . doxycycline (VIBRA-TABS) 100 MG tablet Take 1 tablet (100 mg total) by mouth 2 (two) times daily. 14 tablet 0  . Multiple Vitamins-Minerals (WOMENS 50+ MULTI VITAMIN/MIN PO) Take by mouth.     No facility-administered medications prior to visit.     ROS Review of Systems  Constitutional: Positive for appetite change and unexpected weight change. Negative for activity  change, chills, diaphoresis, fatigue and fever.  HENT: Negative.  Negative for trouble swallowing.   Eyes: Negative for visual disturbance.  Respiratory: Positive for cough. Negative for chest tightness and shortness of breath.   Cardiovascular: Negative for chest pain, palpitations and leg swelling.  Gastrointestinal: Negative for abdominal pain, blood in stool, constipation, diarrhea, nausea and vomiting.  Endocrine: Negative.   Genitourinary: Negative.  Negative for difficulty urinating.  Musculoskeletal: Negative.   Skin: Negative.  Negative for color change and pallor.  Neurological: Positive for numbness. Negative for dizziness, weakness and headaches.  Hematological: Negative for adenopathy. Does not bruise/bleed easily.  Psychiatric/Behavioral: Positive for confusion and decreased concentration. Negative for agitation, behavioral problems, self-injury and suicidal ideas. The patient is not nervous/anxious.     Objective:  BP 104/64 (BP Location: Left Arm, Patient Position: Sitting, Cuff Size: Normal)   Pulse 94   Temp 98.7 F (37.1 C) (Oral)   Resp 16   Ht 5\' 2"  (1.575 m)   Wt 115 lb 8 oz (52.4 kg)   SpO2 94%   BMI 21.13 kg/m   BP Readings from Last 3 Encounters:  08/07/16 104/64  08/01/16 116/70  04/26/16 130/80    Wt Readings from Last 3 Encounters:  08/07/16 115 lb 8 oz (52.4 kg)  08/01/16 115 lb 9.6 oz (52.4 kg)  04/26/16 122 lb 6.4 oz (55.5 kg)    Physical Exam  Constitutional: She is oriented to person, place, and time.  Non-toxic appearance. She does not have a sickly appearance. She  does not appear ill. No distress.  HENT:  Mouth/Throat: Oropharynx is clear and moist. No oropharyngeal exudate.  Eyes: Conjunctivae are normal. Right eye exhibits no discharge. Left eye exhibits no discharge. No scleral icterus.  Neck: Normal range of motion. Neck supple. No JVD present. No tracheal deviation present. No thyromegaly present.  Cardiovascular: Normal rate,  regular rhythm, normal heart sounds and intact distal pulses.  Exam reveals no gallop.   No murmur heard. Pulmonary/Chest: Effort normal and breath sounds normal. No respiratory distress. She has no wheezes. She has no rales. She exhibits no tenderness.  Abdominal: Soft. Bowel sounds are normal. She exhibits no distension and no mass. There is no tenderness. There is no rebound and no guarding.  Musculoskeletal: Normal range of motion. She exhibits no edema, tenderness or deformity.  Lymphadenopathy:    She has no cervical adenopathy.  Neurological: She is alert and oriented to person, place, and time.  Skin: Skin is warm and dry. No rash noted. She is not diaphoretic. No erythema. No pallor.  Psychiatric: She has a normal mood and affect. Her behavior is normal. Judgment and thought content normal.  Vitals reviewed.   Lab Results  Component Value Date   WBC 11.8 (H) 08/07/2016   HGB 10.1 (L) 08/07/2016   HCT 30.9 (L) 08/07/2016   PLT 670.0 (H) 08/07/2016   GLUCOSE 118 (H) 08/07/2016   CHOL 116 08/07/2016   TRIG 156.0 (H) 08/07/2016   HDL 25.80 (L) 08/07/2016   LDLCALC 59 08/07/2016   ALT 29 08/07/2016   AST 19 08/07/2016   NA 143 08/07/2016   K 3.2 (L) 08/07/2016   CL 100 08/07/2016   CREATININE 0.81 08/07/2016   BUN 16 08/07/2016   CO2 36 (H) 08/07/2016   TSH 0.90 08/07/2016   INR 1.0 09/15/2014   HGBA1C 4.9 06/01/2015    No results found.  Assessment & Plan:   [name] was seen today for cough.  Diagnoses and all orders for this visit:  Vitamin B12 deficiency neuropathy (HCC)- she is still anemic but her vitamin B12 level is normal now. I have asked her to follow-up with neurology. -     CBC with Differential/Platelet; Future -     Folate; Future -     Vitamin B12; Future  Essential hypertension- her blood pressure is adequately well-controlled, electrolytes and renal function are normal. -     Comprehensive metabolic panel; Future  Dietary folate deficiency  anemia -     CBC with Differential/Platelet; Future -     Folate; Future  Cough- her chest x-ray is concerning for either pneumonia or lung cancer. I've asked her to undergo a stat CT scan to better identify what is causing the abnormal chest x-ray. -     DG Chest 2 View; Future -     CT CHEST W CONTRAST; Future  Memory loss or impairment -     Ambulatory referral to Neurology  Hyperlipidemia LDL goal <130 -     Lipid panel; Future -     TSH; Future  Need for hepatitis C screening test -     Hepatitis C antibody; Future  Need for vaccination against Streptococcus pneumoniae using pneumococcal conjugate vaccine 13 -     Pneumococcal conjugate vaccine 13-valent  Abnormal CXR (chest x-ray)- as above -     CT CHEST W CONTRAST; Future  Deficiency anemia- she has a worsened anemia with elevated white cell count and platelet count, her B12 and folate levels are  normal, I've asked her to return to be screened for iron and thiamine deficiency. -     IBC panel; Future -     Ferritin; Future -     Vitamin B1; Future  Hypokalemia- will start potassium replacement therapy and we'll screen for hypomagnesemia as well. -     Magnesium; Future -     potassium chloride SA (K-DUR,KLOR-CON) 20 MEQ tablet; Take 1 tablet (20 mEq total) by mouth 2 (two) times daily.   I have discontinued Ms. Solum's acetaminophen, albuterol, Multiple Vitamins-Minerals (WOMENS 50+ MULTI VITAMIN/MIN PO), and doxycycline. I am also having her start on potassium chloride SA. Additionally, I am having her maintain her folic acid, vitamin B-12, and umeclidinium-vilanterol.  Meds ordered this encounter  Medications  . potassium chloride SA (K-DUR,KLOR-CON) 20 MEQ tablet    Sig: Take 1 tablet (20 mEq total) by mouth 2 (two) times daily.    Dispense:  180 tablet    Refill:  1     Follow-up: Return in about 3 weeks (around 08/28/2016).  Sanda Linger, MD

## 2016-08-08 ENCOUNTER — Telehealth: Payer: Self-pay | Admitting: Internal Medicine

## 2016-08-08 ENCOUNTER — Other Ambulatory Visit: Payer: Self-pay | Admitting: Internal Medicine

## 2016-08-08 ENCOUNTER — Ambulatory Visit (INDEPENDENT_AMBULATORY_CARE_PROVIDER_SITE_OTHER)
Admission: RE | Admit: 2016-08-08 | Discharge: 2016-08-08 | Disposition: A | Discharge status: Home / Self Care | Payer: Medicare Other | Source: Ambulatory Visit | Attending: Internal Medicine | Admitting: Internal Medicine

## 2016-08-08 DIAGNOSIS — R938 Abnormal findings on diagnostic imaging of other specified body structures: Secondary | ICD-10-CM

## 2016-08-08 DIAGNOSIS — R059 Cough, unspecified: Secondary | ICD-10-CM

## 2016-08-08 DIAGNOSIS — J13 Pneumonia due to Streptococcus pneumoniae: Secondary | ICD-10-CM

## 2016-08-08 DIAGNOSIS — R9389 Abnormal findings on diagnostic imaging of other specified body structures: Secondary | ICD-10-CM

## 2016-08-08 DIAGNOSIS — R05 Cough: Secondary | ICD-10-CM

## 2016-08-08 DIAGNOSIS — B479 Mycetoma, unspecified: Secondary | ICD-10-CM

## 2016-08-08 DIAGNOSIS — J189 Pneumonia, unspecified organism: Secondary | ICD-10-CM | POA: Insufficient documentation

## 2016-08-08 DIAGNOSIS — D539 Nutritional anemia, unspecified: Secondary | ICD-10-CM | POA: Insufficient documentation

## 2016-08-08 LAB — HEPATITIS C ANTIBODY: HCV Ab: NEGATIVE

## 2016-08-08 MED ORDER — IOPAMIDOL (ISOVUE-300) INJECTION 61%
80.0000 mL | Freq: Once | INTRAVENOUS | Status: AC | PRN
  Administered 2016-08-08: 80 mL via INTRAVENOUS

## 2016-08-08 MED ORDER — POTASSIUM CHLORIDE CRYS ER 20 MEQ PO TBCR: 20.0000 meq | EXTENDED_RELEASE_TABLET | Freq: Two times a day (BID) | ORAL | 1 refills | Status: DC

## 2016-08-08 MED ORDER — AMOXICILLIN-POT CLAVULANATE 875-125 MG PO TABS: 1.0000 | ORAL_TABLET | Freq: Two times a day (BID) | ORAL | 0 refills | Status: AC

## 2016-08-08 NOTE — Telephone Encounter (Signed)
Daughter called back.  Gave MD response on labs.  [facility].

## 2016-08-08 NOTE — Telephone Encounter (Signed)
Contact pt dtr Georgiann Hahn(Kat) and gave results again. Georgiann HahnKat wanted to know when the labs needed to be done and I informed that they can be done at pt earliest convenience.

## 2016-08-12 ENCOUNTER — Telehealth: Payer: Self-pay | Admitting: Internal Medicine

## 2016-08-12 NOTE — Telephone Encounter (Signed)
Spoke with pt. She is aware of this appointment date and time. Nothing further was needed.

## 2016-08-12 NOTE — Telephone Encounter (Signed)
Called patient, aware that we are working her in for an appt with Dr Maple HudsonYoung sooner than 09/12/16 per Dr Yetta BarreJones' request.  Dr Yetta BarreJones is requesting the patient be seen sooner than follow up scheduled for 7/12 d/t recent CT chest 08/08/16. Please advise Dr Maple HudsonYoung. Thanks.

## 2016-08-12 NOTE — Telephone Encounter (Signed)
Please let patient know we can see her this week on Thursday 08/15/16 at 11:30am; pt needs to be here by 11:15am to check in and complete paperwork. Thanks.

## 2016-08-15 ENCOUNTER — Other Ambulatory Visit (INDEPENDENT_AMBULATORY_CARE_PROVIDER_SITE_OTHER): Payer: Medicare Other

## 2016-08-15 ENCOUNTER — Encounter: Payer: Self-pay | Admitting: Internal Medicine

## 2016-08-15 ENCOUNTER — Ambulatory Visit (INDEPENDENT_AMBULATORY_CARE_PROVIDER_SITE_OTHER): Payer: Medicare Other | Admitting: Internal Medicine

## 2016-08-15 VITALS — BP 100/78 | HR 67 | Ht 62.0 in | Wt 116.8 lb

## 2016-08-15 DIAGNOSIS — D539 Nutritional anemia, unspecified: Secondary | ICD-10-CM

## 2016-08-15 DIAGNOSIS — B479 Mycetoma, unspecified: Secondary | ICD-10-CM | POA: Diagnosis not present

## 2016-08-15 DIAGNOSIS — E876 Hypokalemia: Secondary | ICD-10-CM | POA: Diagnosis not present

## 2016-08-15 DIAGNOSIS — J449 Chronic obstructive pulmonary disease, unspecified: Secondary | ICD-10-CM

## 2016-08-15 DIAGNOSIS — J690 Pneumonitis due to inhalation of food and vomit: Secondary | ICD-10-CM | POA: Diagnosis not present

## 2016-08-15 DIAGNOSIS — D869 Sarcoidosis, unspecified: Secondary | ICD-10-CM | POA: Diagnosis not present

## 2016-08-15 LAB — IBC PANEL
Iron: 62 ug/dL (ref 42–145)
SATURATION RATIOS: 22.7 % (ref 20.0–50.0)
Transferrin: 195 mg/dL — ABNORMAL LOW (ref 212.0–360.0)

## 2016-08-15 LAB — FERRITIN: FERRITIN: 226.9 ng/mL (ref 10.0–291.0)

## 2016-08-15 LAB — MAGNESIUM: MAGNESIUM: 1.8 mg/dL (ref 1.5–2.5)

## 2016-08-15 NOTE — Patient Instructions (Addendum)
Finish the augmentin antibiotic  Be sure to eat enough to keep your weight up  Continue to use the Anoro Ellipta inhaler    1 puff, once daily  Order- office spirometry     Dx Sarcoid             Lab- ACE level        Dx sarcoid

## 2016-08-15 NOTE — Progress Notes (Signed)
HPI female former smoker followed for Sarcoid stage IV, COPD, history pneumonia, bronchiectasis and hemoptysis in the past. Original BX 2008 bronchoscopy, treated then with prednisone PFT: 04/04/2011-moderate obstructive airways disease with insignificant response to dilator diffusion severely reduced c/w emphysema. FEV1/FVC 0.52, DLCO 43%. 6 minut.e walk test: 04/04/2011 97%, dropping to 94% and a block, rebounded to 98% with 2 minutes rest. 312 m ACE 33 01/31/11 ACE 08/30/2014-37 Office Spirometry 08/15/16-unreliable effort-moderate obstruction-FVC 1.79/82%, FEV1 1.01/60%, ratio 0.56, FEF 25-75% 0.33/21% ---------------------------------------------------------------------------------------------------  04/26/2016-71 year old female former smoker followed for Sarcoid stage IV, COPD, history pneumonia, bronchiectasis and hemoptysis, complicated by HBP Original BX 2008 bronchoscopy, treated then with prednisone FOLLOWS FOR: Pt wanted to follow up for sarcoid-having trouble with pain in chest/lung area and fingers.  She came with grandson today, still concerned about paresthesias in hands "like sandpaper". Also describes a sharp twinge of pain felt across the right anterior chest wall sometimes if lying in bed and turns to the right. Denies fever, sweat, adenopathy, rash, shortness of breath or cough. Denies neck pain. Sometimes right hand cramps.  08/15/16- [id number] year old female former smoker followed for Sarcoid stage IV, COPD, history pneumonia, bronchiectasis and hemoptysis, complicated by HBP Original BX 2008 bronchoscopy, treated then with prednisone FOLLOW UP FOR patient is here for a follow up after she was seen by Rubye Oaksammy Parrett she is in need of a refill of a medication  She had presented to PCP June 6 with 1 week history increased cough yellow sputum. Had been recently seen here by NP and treated with doxycycline. Note concern of weight loss, memory loss. CT chest was ordered. MR brain ordered.  Augmentin 10 days 6/7-6/17. I reviewed the CT images with patient and her daughter -possible pneumonia in the right lung base which could be consistent with aspiration. Also there is now a mycetoma in the left apical cavity. She has not had any hemoptysis and reports feeling much better. Sputum is now white, no more fever or sweat. Continues to use her inhaler as directed. Still complaining of numbing paresthesias in her hands, being evaluated.  CT chest 08/08/16 IMPRESSION: 1. Advanced upper lung predominant fibrotic interstitial lung disease compatible with pulmonary sarcoidosis. 2. Worsened patchy peribronchovascular consolidation and ground-glass attenuation at the right lung base, suspicious for bronchopneumonia superimposed on chronic interstitial disease. Consider aspiration pneumonia. 3. New mycetoma involving the dominant left upper lobe lung cavity. 4. Stable chronic mediastinal lymphadenopathy compatible with sarcoidosis. 5. Stable dilated main pulmonary artery, suggesting chronic pulmonary arterial hypertension. 6. Aortic atherosclerosis. Office Spirometry 08/15/16-unreliable effort-moderate obstruction-FVC 1.79/82%, FEV1 1.01/60%, ratio 0.56, FEF 25-75% 0.33/21%  ROS-see HPI   + = pos Constitutional:   No-   weight loss, night sweats, fevers, chills, fatigue, lassitude. HEENT:   No-  headaches, difficulty swallowing, tooth/dental problems, sore throat,         nasal congestion, post nasal drip,  CV:  No-   chest pain, orthopnea, PND, swelling in lower extremities, anasarca, dizziness, palpitations Resp:  +shortness of breath with exertion or at rest.              No-   productive cough,   non-productive cough,   coughing up of blood.              No-   change in color of mucus.  No- wheezing.   Skin: No-   rash or lesions. GI:  No-   heartburn, indigestion, abdominal pain, nausea, vomiting, diarrhea,  change in bowel habits, loss of appetite GU:  MS:  No-    joint pain or swelling.  No- decreased range of motion.  No- back pain. Neuro-     + Per HPI Psych:  No- change in mood or affect. No depression or anxiety.  No memory loss.  OBJ General- Alert, Oriented, Affect-appropriate, Distress- none acute, trim Skin- rash-none, lesions- none, excoriation- none Lymphadenopathy- none Head- atraumatic            Eyes- Gross vision intact, PERRLA, conjunctivae             Ears- Hearing, canals-normal            Nose- Clear, no-Septal dev, mucus, polyps, erosion, perforation             Throat- Mallampati II , mucosa clear , drainage- none, tonsils- atrophic Neck- flexible , trachea midline, no stridor , thyroid nl, carotid no bruit Chest - symmetrical excursion , unlabored           Heart/CV- RRR , no murmur , no gallop  , no rub, nl s1 s2                           - JVD- none , edema- none, stasis changes- none, varices- none           Lung- + few crackles/unlabored, wheeze- none, cough- none , dullness-none, rub- none           Chest wall-  Abd- Br/ Gen/ Rectal- Not done, not indicated Extrem- cyanosis- none, clubbing, none, atrophy- none, strength- nl Neuro- grossly intact to observation, grip seems normal

## 2016-08-16 ENCOUNTER — Encounter: Payer: Self-pay | Admitting: Neurology

## 2016-08-18 NOTE — Assessment & Plan Note (Signed)
She has stage IV scarring. At her age hopefully the actual inflammation is burned-out. Plan-update ACE level

## 2016-08-18 NOTE — Assessment & Plan Note (Signed)
She continues Anoro.. Bronchitis symptoms with any exacerbation of her stage IV underlying sarcoid scarring.

## 2016-08-18 NOTE — Assessment & Plan Note (Signed)
We're watching this. Unless it becomes symptomatic we will leave it alone. Hemoptysis is most likely complication.

## 2016-08-18 NOTE — Assessment & Plan Note (Addendum)
She and her daughter are going to pay more attention. Location of infiltrate in right lower lung would be consistent with aspiration. Reflux precautions reviewed. Plan-finish Augmentin

## 2016-08-19 LAB — VITAMIN B1: VITAMIN B1 (THIAMINE): 10 nmol/L (ref 8–30)

## 2016-09-12 ENCOUNTER — Ambulatory Visit: Payer: Medicare Other | Admitting: Internal Medicine

## 2016-10-02 ENCOUNTER — Ambulatory Visit: Payer: Medicare Other | Admitting: Neurology

## 2016-12-02 ENCOUNTER — Encounter: Payer: Self-pay | Admitting: Neurology

## 2016-12-02 ENCOUNTER — Ambulatory Visit (INDEPENDENT_AMBULATORY_CARE_PROVIDER_SITE_OTHER): Payer: Medicare Other | Admitting: Neurology

## 2016-12-02 VITALS — BP 120/66 | HR 68 | Ht 61.0 in | Wt 110.0 lb

## 2016-12-02 DIAGNOSIS — D869 Sarcoidosis, unspecified: Secondary | ICD-10-CM

## 2016-12-02 DIAGNOSIS — R202 Paresthesia of skin: Secondary | ICD-10-CM

## 2016-12-02 DIAGNOSIS — F03C0 Unspecified dementia, severe, without behavioral disturbance, psychotic disturbance, mood disturbance, and anxiety: Secondary | ICD-10-CM | POA: Insufficient documentation

## 2016-12-02 DIAGNOSIS — R413 Other amnesia: Secondary | ICD-10-CM | POA: Diagnosis not present

## 2016-12-02 DIAGNOSIS — G3184 Mild cognitive impairment, so stated: Secondary | ICD-10-CM | POA: Diagnosis not present

## 2016-12-02 MED ORDER — DONEPEZIL HCL 5 MG PO TABS: ORAL_TABLET | ORAL | 6 refills | Status: DC
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
## 2016-12-02 NOTE — Addendum Note (Signed)
Addended by: Horatio Pel on: 12/02/2016 02:02 PM   Modules accepted: Orders

## 2016-12-02 NOTE — Patient Instructions (Addendum)
1. Schedule MRI brain with and without contrast  We have sent a referral to [facility] for your MRI and they will call you directly to schedule your appt. They are located at [address] [facility]. If you need to contact them directly please call [phone].   2. Schedule EMG/NCV of right UE 3. Start Aricept : Take 1/2 tablet daily for a month, then increase to 1 tablet daily 4. Continue all your medications 5. Follow-up in 6 months, call for any changes

## 2016-12-13 ENCOUNTER — Ambulatory Visit (HOSPITAL_COMMUNITY)
Admission: EM | Admit: 2016-12-13 | Discharge: 2016-12-13 | Disposition: A | Discharge status: Home / Self Care | Payer: Medicare Other | Attending: Family Medicine | Admitting: Family Medicine

## 2016-12-13 ENCOUNTER — Encounter (HOSPITAL_COMMUNITY): Payer: Self-pay

## 2016-12-13 ENCOUNTER — Ambulatory Visit: Payer: Medicare Other | Admitting: Internal Medicine

## 2016-12-13 ENCOUNTER — Telehealth: Payer: Self-pay | Admitting: Neurology

## 2016-12-13 DIAGNOSIS — R21 Rash and other nonspecific skin eruption: Secondary | ICD-10-CM | POA: Insufficient documentation

## 2016-12-13 DIAGNOSIS — B029 Zoster without complications: Secondary | ICD-10-CM | POA: Insufficient documentation

## 2016-12-13 MED ORDER — VALACYCLOVIR HCL 1 G PO TABS: 1000.0000 mg | ORAL_TABLET | Freq: Three times a day (TID) | ORAL | 0 refills | Status: DC

## 2016-12-13 NOTE — Telephone Encounter (Signed)
Patient came to office asking about rash on abdomen, I went in to examine patient, there were bullous changes and vesicles, some oozing, in a dermatomal distribution on the right T11-12 distribution concerning for shingles. She states this only started yesterday, but the appearance of skin changes look like this started a few days ago. Discussed that this is not due to Aricept. Recommended Urgent Care. Patient and grandson expressed understanding.

## 2016-12-13 NOTE — Telephone Encounter (Signed)
Pt came into the office with what she thought was a medication reaction to her Donepezil.  I brought her into the examination room and examined her rash.  It covered her right side abdomen to her back, was bright red, oozing, with some skin breakdown.  I did not believe this was a reaction to her medication and asked that Dr. Karel Jarvis examine pt.  Dr Karel Jarvis examined and instructed pt to go to the urgent care as it appeared that pt has shingles and is need of antibiotic.  Pt and her son agreed to head to the urgent care.  Pt instructed to continue her Donepezil.

## 2016-12-13 NOTE — Telephone Encounter (Signed)
Patricia Davies came by the office today regarding her Donepezil medication. She got up today and noticed a break out on her stomach this morning. Patient would like someone to look at it. Please Advise. Thanks

## 2016-12-13 NOTE — ED Provider Notes (Signed)
[facility]    CSN: [id number] Arrival date & time: 12/13/16  1539     History   Chief Complaint Chief Complaint  Patient presents with  . Herpes Zoster    HPI Patricia Davies is a 71 y.o. female.   71 year old female presents to the urgent care with a rather impressive rash to the right abdomen starting from the spine and radiating around to the midline of the abdomen. Denies pain, paresthesias, itching, tenderness or other discomfort of the rash.  Noteworthy when rubbing the skin and the Pain the vesicles the patient states she did not feel this. The patient does have decreased sensitivity in the area of the rash.      Past Medical History:  Diagnosis Date  . COPD (chronic obstructive pulmonary disease) (HCC)   . History of nephrolithiasis   . Hypertension   . Sarcoidosis of skin   . Vitamin B12 deficiency     Patient Active Problem List   Diagnosis Date Noted  . Mild cognitive impairment 12/02/2016  . Abnormal CXR (chest x-ray) 08/08/2016  . Deficiency anemia 08/08/2016  . Pneumonia involving right lung 08/08/2016  . Mycetoma, unspecified 08/08/2016  . Cough 08/07/2016  . Memory loss or impairment 08/07/2016  . Hyperlipidemia LDL goal <130 08/07/2016  . Need for hepatitis C screening test 08/07/2016  . Hypokalemia 08/22/2015  . Dietary folate deficiency anemia 06/01/2015  . Vitamin B12 deficiency neuropathy (HCC) 09/15/2014  . COPD mixed type (HCC) 04/05/2011  . Hyperglycemia 01/16/2011  . Sarcoidosis 03/16/2010  . Essential hypertension 03/16/2010    Past Surgical History:  Procedure Laterality Date  . ABDOMINAL HYSTERECTOMY    . CHOLECYSTECTOMY      OB History    No data available       Home Medications    Prior to Admission medications   Medication Sig Start Date End Date Taking? Authorizing Provider  donepezil (ARICEPT) 5 MG tablet Take 1/2 tablet daily for 1 month, then increase to 1 tablet daily 12/02/16  Yes Van Clines, MD   potassium chloride SA (K-DUR,KLOR-CON) 20 MEQ tablet Take 1 tablet (20 mEq total) by mouth 2 (two) times daily. 08/08/16  Yes [name], MD  umeclidinium-vilanterol (ANORO ELLIPTA) 62.5-25 MCG/INH AEPB Inhale 1 puff, once daily- maintenance 08/01/16  Yes Parrett, Tammy S, NP  valACYclovir (VALTREX) 1000 MG tablet Take 1 tablet (1,000 mg total) by mouth 3 (three) times daily. 12/13/16   Hayden Rasmussen, NP  vitamin B-12 (CYANOCOBALAMIN) 1000 MCG tablet Take 1,000 mcg by mouth daily.    [provider]    Family History Family History  Problem Relation Age of Onset  . Hypertension Maternal Aunt   . Diabetes Maternal Aunt     Social History Social History  Substance Use Topics  . Smoking status: Former Smoker    Packs/day: 0.50    Years: 20.00    Types: Cigarettes    Quit date: 03/04/1990  . Smokeless tobacco: Never Used     Comment: Regukar exercise - Yes  . Alcohol use 0.6 oz/week    1 Cans of beer per week     Comment: social     Allergies   Patient has no known allergies.   Review of Systems Review of Systems  Constitutional: Negative.   Skin: Positive for rash.  All other systems reviewed and are negative.    Physical Exam Triage Vital Signs ED Triage Vitals  Enc Vitals Group     BP  Pulse      Resp      Temp      Temp src      SpO2      Weight      Height      Head Circumference      Peak Flow      Pain Score      Pain Loc      Pain Edu?      Excl. in GC?    No data found.   Updated Vital Signs BP 120/68 (BP Location: Right Arm)   Pulse 79   Temp 98.4 F (36.9 C) (Oral)   Resp 18   SpO2 95%   Visual Acuity Right Eye Distance:   Left Eye Distance:   Bilateral Distance:    Right Eye Near:   Left Eye Near:    Bilateral Near:     Physical Exam  Constitutional: She is oriented to person, place, and time. She appears well-developed and well-nourished. No distress.  Neck: Neck supple.  Cardiovascular: Normal rate.     Pulmonary/Chest: Effort normal.  Musculoskeletal: Normal range of motion. She exhibits no edema.  Neurological: She is alert and oriented to person, place, and time.  Skin: Skin is warm and dry.  Papulovesicular rash with coalescing lesions over a red base located primarily in the right dermatomes of T9-T12. It does not extend beyond the midline of the spine or mid abdomen. Nontender. Decreased sensitivity. No evidence of bacterial infection at this time. See accompanied photograph.  Psychiatric: She has a normal mood and affect.  Nursing note and vitals reviewed.    UC Treatments / Results  Labs (all labs ordered are listed, but only abnormal results are displayed) Labs Reviewed  HSV CULTURE AND TYPING  AEROBIC CULTURE (SUPERFICIAL SPECIMEN)    EKG  EKG Interpretation None           Radiology No results found.  Procedures Procedures (including critical care time)  Medications Ordered in UC Medications - No data to display   Initial Impression / Assessment and Plan / UC Course  I have reviewed the triage vital signs and the nursing notes.  Pertinent labs & imaging results that were available during my care of the patient were reviewed by me and considered in my medical decision making (see chart for details).    Keep the rash clean was so mild soap and water daily. If he gets worse or if you notice pus, increased redness, pain, fever, feeling sickly or worsening in any other way may return or follow-up with your primary care doctor.    Final Clinical Impressions(s) / UC Diagnoses   Final diagnoses:  Herpes zoster without complication    New Prescriptions New Prescriptions   VALACYCLOVIR (VALTREX) 1000 MG TABLET    Take 1 tablet (1,000 mg total) by mouth 3 (three) times daily.     Controlled Substance Prescriptions [place] Controlled Substance Registry consulted? Not Applicable   Hayden Rasmussen, NP 12/13/16 1645

## 2016-12-13 NOTE — ED Triage Notes (Signed)
Pt was taking a shower today and noticed the rash on her right side radiating to the abdomen. Said it's not painful or itching. Said she thinks it's shingles her pcp recommend she come here.

## 2016-12-13 NOTE — Discharge Instructions (Signed)
Keep the rash clean was so mild soap and water daily. If he gets worse or if you notice pus, increased redness, pain, fever, feeling sickly or worsening in any other way may return or follow-up with your primary care doctor.

## 2016-12-17 LAB — HSV CULTURE AND TYPING

## 2016-12-18 LAB — AEROBIC CULTURE W GRAM STAIN (SUPERFICIAL SPECIMEN)

## 2016-12-18 LAB — AEROBIC CULTURE  (SUPERFICIAL SPECIMEN): CULTURE: NORMAL

## 2016-12-19 ENCOUNTER — Encounter: Payer: Self-pay | Admitting: Internal Medicine

## 2016-12-19 ENCOUNTER — Telehealth: Payer: Self-pay | Admitting: Emergency Medicine

## 2016-12-19 ENCOUNTER — Ambulatory Visit (INDEPENDENT_AMBULATORY_CARE_PROVIDER_SITE_OTHER): Payer: Medicare Other | Admitting: Internal Medicine

## 2016-12-19 VITALS — BP 130/72 | HR 74 | Ht 62.0 in | Wt 124.4 lb

## 2016-12-19 DIAGNOSIS — J449 Chronic obstructive pulmonary disease, unspecified: Secondary | ICD-10-CM | POA: Diagnosis not present

## 2016-12-19 DIAGNOSIS — B479 Mycetoma, unspecified: Secondary | ICD-10-CM

## 2016-12-19 DIAGNOSIS — D869 Sarcoidosis, unspecified: Secondary | ICD-10-CM

## 2016-12-19 NOTE — Progress Notes (Signed)
HPI female former smoker followed for Sarcoid stage IV, COPD, history pneumonia, bronchiectasis and hemoptysis in the past. Original BX 2008 bronchoscopy, treated then with prednisone CT chest 08/18/16- new Mycetoma PFT: 04/04/2011-moderate obstructive airways disease with insignificant response to dilator, diffusion severely reduced c/w emphysema. FEV1/FVC 0.52, DLCO 43%. 6 minut.e walk test: 04/04/2011 97%, dropping to 94% and a block, rebounded to 98% with 2 minutes rest. 312 m ACE 33 01/31/11 ACE 08/30/2014-37 Office Spirometry 08/15/16-unreliable effort-moderate obstruction-FVC 1.79/82%, FEV1 1.01/60%, ratio 0.56, FEF 25-75% 0.33/21% -------------------------------------------------------------------------------------------------- 08/15/16- 71 year old female former smoker followed for Sarcoid stage IV, COPD, history pneumonia, bronchiectasis and hemoptysis, complicated by HBP Original BX 2008 bronchoscopy, treated then with prednisone FOLLOW UP FOR patient is here for a follow up after she was seen by Rubye Oaksammy Parrett she is in need of a refill of a medication  She had presented to PCP June 6 with 1 week history increased cough yellow sputum. Had been recently seen here by NP and treated with doxycycline. Note concern of weight loss, memory loss. CT chest was ordered. MR brain ordered. Augmentin 10 days 6/7-6/17. I reviewed the CT images with patient and her daughter -possible pneumonia in the right lung base which could be consistent with aspiration. Also there is now a mycetoma in the left apical cavity. She has not had any hemoptysis and reports feeling much better. Sputum is now white, no more fever or sweat. Continues to use her inhaler as directed. Still complaining of numbing paresthesias in her hands, being evaluated.  CT chest 08/08/16 IMPRESSION: 1. Advanced upper lung predominant fibrotic interstitial lung disease compatible with pulmonary sarcoidosis. 2. Worsened patchy  peribronchovascular consolidation and ground-glass attenuation at the right lung base, suspicious for bronchopneumonia superimposed on chronic interstitial disease. Consider aspiration pneumonia. 3. New mycetoma involving the dominant left upper lobe lung cavity. 4. Stable chronic mediastinal lymphadenopathy compatible with sarcoidosis. 5. Stable dilated main pulmonary artery, suggesting chronic pulmonary arterial hypertension. 6. Aortic atherosclerosis. Office Spirometry 08/15/16-unreliable effort-moderate obstruction-FVC 1.79/82%, FEV1 1.01/60%, ratio 0.56, FEF 25-75% 0.33/21%  12/19/16- 71 year old female former smoker followed for Sarcoid stage IV, COPD, history pneumonia, bronchiectasis and hemoptysis, complicated by HBP Original BX 2008 bronchoscopy, treated then with prednisone  Mycetoma LUL cavity -- Sarcoidosis; Pt has good and bad days with breathing-short winded on bad days. ? Shingles. Being treated for shingles right flank but otherwise feels stable.  Denies cough, hemoptysis, chest pain or change in exercise tolerance.  Walking regularly for endurance.  ROS-see HPI   + = pos Constitutional:   No-   weight loss, night sweats, fevers, chills, fatigue, lassitude. HEENT:   No-  headaches, difficulty swallowing, tooth/dental problems, sore throat,         nasal congestion, post nasal drip,  CV:  No-   chest pain, orthopnea, PND, swelling in lower extremities, anasarca, dizziness, palpitations Resp:  +shortness of breath with exertion or at rest.              No-   productive cough,   non-productive cough,   coughing up of blood.              No-   change in color of mucus.  No- wheezing.   Skin: No-   rash or lesions. GI:  No-   heartburn, indigestion, abdominal pain, nausea, vomiting, diarrhea,                 change in bowel habits, loss of appetite GU:  MS:  No-  joint pain or swelling.  No- decreased range of motion.  No- back pain. Neuro-     + Per HPI Psych:  No- change  in mood or affect. No depression or anxiety.  No memory loss.  OBJ General- Alert, Oriented, Affect-appropriate, Distress- none acute, trim, +O2 room air saturation 95% Skin- + dense scaling rash right flank consistent with shingles Lymphadenopathy- none Head- atraumatic            Eyes- Gross vision intact, PERRLA, conjunctivae             Ears- Hearing, canals-normal            Nose- Clear, no-Septal dev, mucus, polyps, erosion, perforation             Throat- Mallampati II , mucosa clear , drainage- none, tonsils- atrophic Neck- flexible , trachea midline, no stridor , thyroid nl, carotid no bruit Chest - symmetrical excursion , unlabored           Heart/CV- RRR , no murmur , no gallop  , no rub, nl s1 s2                           - JVD- none , edema- none, stasis changes- none, varices- none           Lung- + few crackles/unlabored, wheeze- none, cough- none , dullness-none, rub- none           Chest wall-  Abd- Br/ Gen/ Rectal- Not done, not indicated Extrem- cyanosis- none, clubbing, none, atrophy- none, strength- nl Neuro- grossly intact to observation, grip seems normal

## 2016-12-19 NOTE — Patient Instructions (Addendum)
Order- CXR   Dx sarcoid, mycetoma  Order- schedule ONOX on room air      Dx sarcoid  Please call as needed

## 2016-12-19 NOTE — Telephone Encounter (Signed)
Post ED Visit - Positive Culture Follow-up  Culture report reviewed by antimicrobial stewardship pharmacist:  []  Enzo BiNathan Batchelder, Pharm.D. []  Celedonio MiyamotoJeremy Frens, Pharm.D., BCPS AQ-ID []  Garvin FilaMike Maccia, Pharm.D., BCPS []  Georgina PillionElizabeth Martin, [address]Ph[address] BCPS []  La ConnerMinh Pham, [address]Ph[address]D., BCPS, AAHIVP []  Estella HuskMichelle Turner, Pharm.D., BCPS, AAHIVP []  Lysle Pearlachel Rumbarger, PharmD, BCPS []  Casilda Carlsaylor Stone, PharmD, BCPS []  Pollyann SamplesAndy Johnston, PharmD, BCPS  negative wound culture Treated with none,  no further patient follow-up is required at this time.  Berle MullMiller, Mindee Robledo 12/19/2016, 12:28 PM

## 2016-12-24 ENCOUNTER — Telehealth: Payer: Self-pay | Admitting: Internal Medicine

## 2016-12-24 NOTE — Telephone Encounter (Signed)
ATC pt- unable to leave vm due to mailbox being full.  Will call back 

## 2016-12-24 NOTE — Telephone Encounter (Signed)
I think she mis-understood. Cortisone shot is sometimes given early to reduce pain, but at the stage she is in now, I don't know that it would help. It would be best for her to see a dermatologist, as suggested at her visit .

## 2016-12-24 NOTE — Telephone Encounter (Signed)
Spoke with patient. She stated that she was seen on 12/19/16 and at the time, she showed CY her shingles rash on her stomach. She stated that she discussed a "shot" with CY that will help her heal faster. She is not in pain, just concerned about the appearance and healing faster.   CY, please advise. Thanks!

## 2016-12-26 MED ORDER — PREDNISONE 20 MG PO TABS: 20.0000 mg | ORAL_TABLET | Freq: Every day | ORAL | 0 refills | Status: DC

## 2016-12-26 NOTE — Telephone Encounter (Signed)
Called and spoke with pt and she is aware of CY recs.  She was a little upset that CY wanted her to be seen by someone else.  She wanted to see if CY would do the referral to dermatology for her.  CY please advise. Thanks

## 2016-12-26 NOTE — Telephone Encounter (Signed)
Called pt and advised message from the provider. Pt understood and verbalized understanding. Nothing further is needed.   Spoke with pt and advised rx sent to pharmacy   

## 2016-12-26 NOTE — Telephone Encounter (Signed)
Suggest we offer prednisone 20 mg, # 5, 1 daily x 5 days. Continue moisturizing lotion. If no better after a week, let us know and we will help with dermatoliogy referral

## 2017-02-01 NOTE — Assessment & Plan Note (Addendum)
Chest 08/18/16 LUL Plan-observation as long as this is asymptomatic.

## 2017-02-01 NOTE — Assessment & Plan Note (Signed)
She is not noticing cough or wheeze. Plan-update CXR, overnight oximetry on room air

## 2017-02-01 NOTE — Assessment & Plan Note (Signed)
Good residual scarring but clinically in long-term remission.

## 2017-02-26 ENCOUNTER — Telehealth: Payer: Self-pay | Admitting: Internal Medicine

## 2017-02-26 NOTE — Telephone Encounter (Signed)
Left message for patient to call back  

## 2017-02-27 ENCOUNTER — Ambulatory Visit (INDEPENDENT_AMBULATORY_CARE_PROVIDER_SITE_OTHER): Payer: Medicare Other | Admitting: Family Medicine

## 2017-02-27 ENCOUNTER — Ambulatory Visit: Payer: Self-pay

## 2017-02-27 ENCOUNTER — Encounter: Payer: Self-pay | Admitting: Family Medicine

## 2017-02-27 VITALS — BP 138/86 | HR 99 | Temp 100.1°F | Ht 62.0 in

## 2017-02-27 DIAGNOSIS — D869 Sarcoidosis, unspecified: Secondary | ICD-10-CM

## 2017-02-27 DIAGNOSIS — R531 Weakness: Secondary | ICD-10-CM | POA: Diagnosis not present

## 2017-02-27 NOTE — Telephone Encounter (Signed)
Pt calling with c/o "can't walk without assistance". Pt states this started three weeks ago. States for 3 weeks has had tingling in her hands, right hand greater than right. Denies dizziness.States that she is having body aches all over. Pt states the pain is worse in her legs that she describes as sharp, aching pain  That she rates a 8 out of 10. Disposition per protocol advised [facility] or PCP triage. Called PCP office and spoke to [place]. [place]arson stated that Dr. Jordan LikesSchmitz has openings. Appt made for pt for 1:40 pm today.  Reason for Disposition . [1] MODERATE weakness (i.e., interferes with work, school, normal activities) AND [2] cause unknown  (Exceptions: weakness with acute minor illness, or weakness from poor fluid intake)  Answer Assessment - Initial Assessment Questions 1. DESCRIPTION: "Describe how you are feeling."     Body aches "feel bad all over", weakness, tingling right hand worse than left 2. SEVERITY: "How bad is it?"  "Can you stand and walk?"   - MILD - Feels weak or tired, but does not interfere with work, school or normal activities   - MODERATE - Able to stand and walk; weakness interferes with work, school, or normal activities   - SEVERE - Unable to stand or walk     Moderate- can walk but has to hold on to something 3. ONSET:  "When did the weakness begin?"     Few weeks ago 4. CAUSE: "What do you think is causing the weakness?"     She states she doesn't know 5. MEDICINES: "Have you recently started a new medicine or had a change in the amount of a medicine?"     no 6. OTHER SYMPTOMS: "Do you have any other symptoms?" (e.g., chest pain, fever, cough, SOB, vomiting, diarrhea, bleeding)     No chest pain,fever,cough, SOB with walking, No vomiting, diarrhea, bleeding 7. PREGNANCY: "Is there any chance you are pregnant?" "When was your last menstrual period?"     n/a  Protocols used: WEAKNESS (GENERALIZED) AND FATIGUE-A-AH

## 2017-02-27 NOTE — Progress Notes (Signed)
Patricia Davies - 71 y.o. female MRN [id number]  Date of birth: 12/18/1945  SUBJECTIVE:  Including CC & ROS.  Chief Complaint  Patient presents with  . Fatigue    Patricia Davies is a 71 y.o. female that is presenting with weakness. It has been ongoing for one month. Admits to numbness and tingling in arms. Patient has not taken anything.  Has been having complications trying to stand. She is accompanied by her daughter who helps provide history. Patient reports she has pain with ambulating and has to use the counters in her house to help with transferring from one place to another. She reports to bowel or bladder incontinence. She denies any sensation changes in her lower extremities.  Her daughter has some concerns for dementia and if her mother is seeking attention. Her mother takes medications but her pill boxes are still full when she visits her mother. Her father in law has a job and works during the day and so her mother spends most of the day alone at home. She reports having pain in there anterior aspect of each lower extremity. This pain is poorly localized to the knee with extension to the proximal thigh. Denies any radicular symptoms. Her daughter reports she doesn't eat much, spends most days inside and doesn't do much for exercise.   She has a history of sarcoidosis but denies any shortness of breath.   Review of Systems  Constitutional: Negative for fever.  Respiratory: Negative for shortness of breath.   Cardiovascular: Negative for chest pain.  Gastrointestinal: Negative for abdominal pain.  Musculoskeletal: Positive for gait problem and myalgias. Negative for back pain.  Skin: Negative for color change.  Neurological: Positive for weakness. Negative for numbness.  Hematological: Negative for adenopathy.  Psychiatric/Behavioral: Negative for agitation.    HISTORY: Past Medical, Surgical, Social, and Family History Reviewed & Updated per EMR.   Pertinent Historical Findings  include:  Past Medical History:  Diagnosis Date  . COPD (chronic obstructive pulmonary disease) (HCC)   . History of nephrolithiasis   . Hypertension   . Sarcoidosis of skin   . Vitamin B12 deficiency     Past Surgical History:  Procedure Laterality Date  . ABDOMINAL HYSTERECTOMY    . CHOLECYSTECTOMY      No Known Allergies  Family History  Problem Relation Age of Onset  . Hypertension Maternal Aunt   . Diabetes Maternal Aunt      Social History   Socioeconomic History  . Marital status: Single    Spouse name: Not on file  . Number of children: 2  . Years of education: Not on file  . Highest education level: Not on file  Social Needs  . Financial resource strain: Not on file  . Food insecurity - worry: Not on file  . Food insecurity - inability: Not on file  . Transportation needs - medical: Not on file  . Transportation needs - non-medical: Not on file  Occupational History  . Occupation: retired    Comment: Runner, broadcasting/film/videoteacher  Tobacco Use  . Smoking status: Former Smoker    Packs/day: 0.50    Years: 20.00    Pack years: 10.00    Types: Cigarettes    Last attempt to quit: 03/04/1990    Years since quitting: 27.0  . Smokeless tobacco: Never Used  . Tobacco comment: Regukar exercise - Yes  Substance and Sexual Activity  . Alcohol use: Yes    Alcohol/week: 0.6 oz    Types: 1 Cans  of beer per week    Comment: social  . Drug use: No  . Sexual activity: Not Currently  Other Topics Concern  . Not on file  Social History Narrative   Pt lives in 1 story home with friend   Has 2 adult children   Highest level of education: BS   Retired Engineer, siteschool teacher     PHYSICAL EXAM:  VS: BP 138/86 (BP Location: Left Arm, Patient Position: Sitting, Cuff Size: Normal)   Pulse 99   Temp 100.1 F (37.8 C) (Oral)   Ht 5\' 2"  (1.575 m)   SpO2 100%   BMI 22.75 kg/m  Physical Exam Gen: NAD, alert, cooperative with exam, ENT: normal lips, normal nasal mucosa,  Eye: normal EOM,  normal conjunctiva and lids CV:  no edema, +2 pedal pulses   Resp: no accessory muscle use, non-labored,  Skin: no rashes, no areas of induration  Neuro: normal tone, normal sensation to touch Psych:  normal insight, alert and oriented MSK:  Back/hips:  No TTP of the GT  Normal IR and ER  Negative SLR b/l  Normal hip flexion strength to resistance  Able to stand on her own and transfer while leaning on the counter  Normal plantar and dorsal flexion  No signs of atrophy  Neurovascularly intact.       ASSESSMENT & PLAN:   I spent 25 minutes with this patient, greater than 50% was face-to-face time counseling regarding the below diagnosis.   Weakness No specific cause to her pain or trouble ambulating. Her daughter has concerns for dementia type changes or attention seeking behavior. Doesn't appear to be caudia equina. -  Counseled patient and her daughter that her exam is not specific to any particular cause. Discussed the possibly reasons for her change. Counseled on the treatment strategies.  - home health for PT and med management and evaluation  - given indications to follow up.

## 2017-02-27 NOTE — Patient Instructions (Signed)
I have sent a referral to health management.

## 2017-02-27 NOTE — Telephone Encounter (Signed)
Patient made an appt with PCP today.

## 2017-03-01 NOTE — Assessment & Plan Note (Addendum)
No specific cause to her pain or trouble ambulating. Her daughter has concerns for dementia type changes or attention seeking behavior. Doesn't appear to be caudia equina. -  Counseled patient and her daughter that her exam is not specific to any particular cause. Discussed the possibly reasons for her change. Counseled on the treatment strategies.  - home health for PT and med management and evaluation  - given indications to follow up.

## 2017-04-21 ENCOUNTER — Ambulatory Visit (INDEPENDENT_AMBULATORY_CARE_PROVIDER_SITE_OTHER)
Admission: RE | Admit: 2017-04-21 | Discharge: 2017-04-21 | Disposition: A | Discharge status: Home / Self Care | Payer: Medicare HMO | Source: Ambulatory Visit | Attending: Internal Medicine | Admitting: Internal Medicine

## 2017-04-21 ENCOUNTER — Encounter: Payer: Self-pay | Admitting: Internal Medicine

## 2017-04-21 ENCOUNTER — Ambulatory Visit (INDEPENDENT_AMBULATORY_CARE_PROVIDER_SITE_OTHER): Payer: BC Managed Care – PPO | Admitting: Internal Medicine

## 2017-04-21 VITALS — BP 124/72 | HR 67 | Ht 62.0 in | Wt 99.8 lb

## 2017-04-21 DIAGNOSIS — D869 Sarcoidosis, unspecified: Secondary | ICD-10-CM

## 2017-04-21 DIAGNOSIS — R918 Other nonspecific abnormal finding of lung field: Secondary | ICD-10-CM | POA: Diagnosis not present

## 2017-04-21 DIAGNOSIS — R634 Abnormal weight loss: Secondary | ICD-10-CM

## 2017-04-21 DIAGNOSIS — R413 Other amnesia: Secondary | ICD-10-CM | POA: Diagnosis not present

## 2017-04-21 DIAGNOSIS — J449 Chronic obstructive pulmonary disease, unspecified: Secondary | ICD-10-CM | POA: Diagnosis not present

## 2017-04-21 NOTE — Assessment & Plan Note (Signed)
Daughter is concerned about mother's memory loss but mother is not excepting.  I emphasized the need to keep the appointment in April with neurology.

## 2017-04-21 NOTE — Patient Instructions (Addendum)
Ok to stop using the Anoro inhaler for now if you don't need it.  Order- CXR     Dx Sarcoid  Order- Lab- CBC w diff, BMET, Hepatic panel, ACE level     Dx Sarcoid  Keep appointment with Neurologist Dr Karel JarvisAquino April 1 at 3:00 PM  Make an appointment with Dr Yetta BarreJones for primary care check up because of your weight loss.  Please call if we can help

## 2017-04-21 NOTE — Assessment & Plan Note (Signed)
Known stage IV scarring and mycetoma left upper lobe as of last imaging.  I doubt active disease. Plan-CXR and labs including ACE level

## 2017-04-21 NOTE — Assessment & Plan Note (Signed)
Weight loss may be secondary to dementia.  She seems unclear about when and what she is eaten.  We can check some labs today but she needs to follow-up with her neurologist and her primary care provider.  Daughter is here today and can help with the appointments.

## 2017-04-21 NOTE — Progress Notes (Signed)
HPI female former smoker followed for Sarcoid stage IV, COPD, history pneumonia, bronchiectasis and hemoptysis in the past. Original BX 2008 bronchoscopy, treated then with prednisone CT chest 08/18/16- new Mycetoma PFT: 04/04/2011-moderate obstructive airways disease with insignificant response to dilator, diffusion severely reduced c/w emphysema. FEV1/FVC 0.52, DLCO 43%. 6 minut.e walk test: 04/04/2011 97%, dropping to 94% and a block, rebounded to 98% with 2 minutes rest. 312 m ACE 33 01/31/11 ACE 08/30/2014-37 Office Spirometry 08/15/16-unreliable effort-moderate obstruction-FVC 1.79/82%, FEV1 1.01/60%, ratio 0.56, FEF 25-75% 0.33/21% --------------------------------------------------------------------------------------------------  12/19/16- 72 year old female former smoker followed for Sarcoid stage IV, COPD, history pneumonia, bronchiectasis and hemoptysis, complicated by HBP Original BX 2008 bronchoscopy, treated then with prednisone  Mycetoma LUL cavity -- Sarcoidosis; Pt has good and bad days with breathing-short winded on bad days. ? Shingles. Being treated for shingles right flank but otherwise feels stable.  Denies cough, hemoptysis, chest pain or change in exercise tolerance.  Walking regularly for endurance.  04/21/17- 72 year old female former smoker followed for Sarcoid stage IV, COPD, history pneumonia, bronchiectasis and hemoptysis, complicated by HBP Original BX 2008 bronchoscopy, treated then with prednisone. Mycetoma LUL cavity. ----4 month ROV  Has lost weight-124 pounds in October, 99 pounds now.  Not eating. Anoro not being used regularly. Daughter here says she sees mother every day but they do not live together.  Daughter is concerned about mother's memory and mother could not report what she had for breakfast today, if anything. Mother is aware of weight loss but denies pain, adenopathy, fever, night sweat, cough or wheeze.  ROS-see HPI   + = pos Constitutional:   +    weight loss, night sweats, fevers, chills, fatigue, lassitude. HEENT:   No-  headaches, difficulty swallowing, tooth/dental problems, sore throat,         nasal congestion, post nasal drip,  CV:  No-   chest pain, orthopnea, PND, swelling in lower extremities, anasarca, dizziness, palpitations Resp:  +shortness of breath with exertion or at rest.              No-   productive cough,   non-productive cough,   coughing up of blood.              No-   change in color of mucus.  No- wheezing.   Skin: No-   rash or lesions. GI:  No-   heartburn, indigestion, abdominal pain, nausea, vomiting, diarrhea,                 change in bowel habits, loss of appetite GU:  MS:  No-   joint pain or swelling.  No- decreased range of motion.  No- back pain. Neuro-     + Per HPI Psych:  No- change in mood or affect. No depression or anxiety.  + memory loss.  OBJ General- Alert, Oriented, Affect-appropriate, Distress- none acute, trim, +O2 room air saturation 95% Skin- + postherpetic hyperpigmentation right flank Lymphadenopathy- none Head- atraumatic            Eyes- Gross vision intact, PERRLA, conjunctivae             Ears- Hearing, canals-normal            Nose- Clear, no-Septal dev, mucus, polyps, erosion, perforation             Throat- Mallampati II , mucosa clear , drainage- none, tonsils- atrophic Neck- flexible , trachea midline, no stridor , thyroid nl, carotid no bruit Chest - symmetrical excursion , unlabored  Heart/CV- RRR , no murmur , no gallop  , no rub, nl s1 s2                           - JVD- none , edema- none, stasis changes- none, varices- none           Lung- + few crackles/unlabored, wheeze- none, cough- none , dullness-none, rub- none           Chest wall-  Abd- Br/ Gen/ Rectal- Not done, not indicated Extrem- cyanosis- none, clubbing, none, atrophy- none, strength- nl Neuro- grossly intact to observation, grip seems normal

## 2017-04-22 NOTE — Progress Notes (Signed)
LMTCB on daughter's (EC) cell phone . House phone listed for patient has a full mailbox and unable to leave a message.

## 2017-05-06 ENCOUNTER — Other Ambulatory Visit (INDEPENDENT_AMBULATORY_CARE_PROVIDER_SITE_OTHER): Payer: Medicare HMO

## 2017-05-06 ENCOUNTER — Ambulatory Visit (INDEPENDENT_AMBULATORY_CARE_PROVIDER_SITE_OTHER)
Admission: RE | Admit: 2017-05-06 | Discharge: 2017-05-06 | Disposition: A | Discharge status: Home / Self Care | Payer: Medicare HMO | Source: Ambulatory Visit | Attending: Internal Medicine | Admitting: Internal Medicine

## 2017-05-06 ENCOUNTER — Ambulatory Visit (INDEPENDENT_AMBULATORY_CARE_PROVIDER_SITE_OTHER): Payer: Medicare HMO | Admitting: Internal Medicine

## 2017-05-06 ENCOUNTER — Encounter: Payer: Self-pay | Admitting: Internal Medicine

## 2017-05-06 VITALS — BP 158/80 | HR 65 | Temp 97.6°F | Resp 16 | Ht 62.0 in | Wt 100.5 lb

## 2017-05-06 DIAGNOSIS — R059 Cough, unspecified: Secondary | ICD-10-CM

## 2017-05-06 DIAGNOSIS — E876 Hypokalemia: Secondary | ICD-10-CM

## 2017-05-06 DIAGNOSIS — D539 Nutritional anemia, unspecified: Secondary | ICD-10-CM

## 2017-05-06 DIAGNOSIS — R739 Hyperglycemia, unspecified: Secondary | ICD-10-CM

## 2017-05-06 DIAGNOSIS — I10 Essential (primary) hypertension: Secondary | ICD-10-CM | POA: Diagnosis not present

## 2017-05-06 DIAGNOSIS — R918 Other nonspecific abnormal finding of lung field: Secondary | ICD-10-CM | POA: Diagnosis not present

## 2017-05-06 DIAGNOSIS — D520 Dietary folate deficiency anemia: Secondary | ICD-10-CM

## 2017-05-06 DIAGNOSIS — E538 Deficiency of other specified B group vitamins: Secondary | ICD-10-CM | POA: Diagnosis not present

## 2017-05-06 DIAGNOSIS — G63 Polyneuropathy in diseases classified elsewhere: Secondary | ICD-10-CM | POA: Diagnosis not present

## 2017-05-06 DIAGNOSIS — R9389 Abnormal findings on diagnostic imaging of other specified body structures: Secondary | ICD-10-CM | POA: Diagnosis not present

## 2017-05-06 DIAGNOSIS — R05 Cough: Secondary | ICD-10-CM

## 2017-05-06 LAB — VITAMIN B12: Vitamin B-12: 655 pg/mL (ref 211–911)

## 2017-05-06 LAB — CBC WITH DIFFERENTIAL/PLATELET
BASOS ABS: 0.1 10*3/uL (ref 0.0–0.1)
Basophils Relative: 0.9 % (ref 0.0–3.0)
Eosinophils Absolute: 0.2 10*3/uL (ref 0.0–0.7)
Eosinophils Relative: 2.7 % (ref 0.0–5.0)
HCT: 34.6 % — ABNORMAL LOW (ref 36.0–46.0)
Hemoglobin: 11.3 g/dL — ABNORMAL LOW (ref 12.0–15.0)
LYMPHS ABS: 3.3 10*3/uL (ref 0.7–4.0)
Lymphocytes Relative: 55.7 % — ABNORMAL HIGH (ref 12.0–46.0)
MCHC: 32.6 g/dL (ref 30.0–36.0)
MCV: 91.3 fl (ref 78.0–100.0)
MONOS PCT: 11.4 % (ref 3.0–12.0)
Monocytes Absolute: 0.7 10*3/uL (ref 0.1–1.0)
NEUTROS PCT: 29.3 % — AB (ref 43.0–77.0)
Neutro Abs: 1.7 10*3/uL (ref 1.4–7.7)
Platelets: 266 10*3/uL (ref 150.0–400.0)
RBC: 3.79 Mil/uL — AB (ref 3.87–5.11)
RDW: 14.5 % (ref 11.5–15.5)
WBC: 5.8 10*3/uL (ref 4.0–10.5)

## 2017-05-06 LAB — BASIC METABOLIC PANEL WITH GFR
BUN: 15 mg/dL (ref 6–23)
CO2: 33 meq/L — ABNORMAL HIGH (ref 19–32)
Calcium: 9.8 mg/dL (ref 8.4–10.5)
Chloride: 103 meq/L (ref 96–112)
Creatinine, Ser: 0.58 mg/dL (ref 0.40–1.20)
GFR: 132.23 mL/min
Glucose, Bld: 94 mg/dL (ref 70–99)
Potassium: 3.4 meq/L — ABNORMAL LOW (ref 3.5–5.1)
Sodium: 141 meq/L (ref 135–145)

## 2017-05-06 LAB — FERRITIN: FERRITIN: 145.3 ng/mL (ref 10.0–291.0)

## 2017-05-06 LAB — IBC PANEL
Iron: 47 ug/dL (ref 42–145)
Saturation Ratios: 14.1 % — ABNORMAL LOW (ref 20.0–50.0)
Transferrin: 238 mg/dL (ref 212.0–360.0)

## 2017-05-06 LAB — FOLATE: Folate: 4.6 ng/mL — ABNORMAL LOW

## 2017-05-06 MED ORDER — FOLIC ACID 1 MG PO TABS: 1.0000 mg | ORAL_TABLET | Freq: Every day | ORAL | 1 refills | Status: DC

## 2017-05-06 NOTE — Patient Instructions (Signed)
Anemia Anemia is a condition in which you do not have enough red blood cells or hemoglobin. Hemoglobin is a substance in red blood cells that carries oxygen. When you do not have enough red blood cells or hemoglobin (are anemic), your body cannot get enough oxygen and your organs may not work properly. As a result, you may feel very tired or have other problems. What are the causes? Common causes of anemia include:  Excessive bleeding. Anemia can be caused by excessive bleeding inside or outside the body, including bleeding from the intestine or from periods in women.  Poor nutrition.  Long-lasting (chronic) kidney, thyroid, and liver disease.  Bone marrow disorders.  Cancer and treatments for cancer.  HIV (human immunodeficiency virus) and AIDS (acquired immunodeficiency syndrome).  Treatments for HIV and AIDS.  Spleen problems.  Blood disorders.  Infections, medicines, and autoimmune disorders that destroy red blood cells.  What are the signs or symptoms? Symptoms of this condition include:  Minor weakness.  Dizziness.  Headache.  Feeling heartbeats that are irregular or faster than normal (palpitations).  Shortness of breath, especially with exercise.  Paleness.  Cold sensitivity.  Indigestion.  Nausea.  Difficulty sleeping.  Difficulty concentrating.  Symptoms may occur suddenly or develop slowly. If your anemia is mild, you may not have symptoms. How is this diagnosed? This condition is diagnosed based on:  Blood tests.  Your medical history.  A physical exam.  Bone marrow biopsy.  Your health care provider may also check your stool (feces) for blood and may do additional testing to look for the cause of your bleeding. You may also have other tests, including:  Imaging tests, such as a CT scan or MRI.  Endoscopy.  Colonoscopy.  How is this treated? Treatment for this condition depends on the cause. If you continue to lose a lot of blood,  you may need to be treated at a hospital. Treatment may include:  Taking supplements of iron, vitamin G26, or folic acid.  Taking a hormone medicine (erythropoietin) that can help to stimulate red blood cell growth.  Having a blood transfusion. This may be needed if you lose a lot of blood.  Making changes to your diet.  Having surgery to remove your spleen.  Follow these instructions at home:  Take over-the-counter and prescription medicines only as told by your health care provider.  Take supplements only as told by your health care provider.  Follow any diet instructions that you were given.  Keep all follow-up visits as told by your health care provider. This is important. Contact a health care provider if:  You develop new bleeding anywhere in the body. Get help right away if:  You are very weak.  You are short of breath.  You have pain in your abdomen or chest.  You are dizzy or feel faint.  You have trouble concentrating.  You have bloody or black, tarry stools.  You vomit repeatedly or you vomit up blood. Summary  Anemia is a condition in which you do not have enough red blood cells or enough of a substance in your red blood cells that carries oxygen (hemoglobin).  Symptoms may occur suddenly or develop slowly.  If your anemia is mild, you may not have symptoms.  This condition is diagnosed with blood tests as well as a medical history and physical exam. Other tests may be needed.  Treatment for this condition depends on the cause of the anemia. This information is not intended to replace advice  given to you by your health care provider. Make sure you discuss any questions you have with your health care provider. Document Released: 03/28/2004 Document Revised: 03/22/2016 Document Reviewed: 03/22/2016 Elsevier Interactive Patient Education  [employer].

## 2017-05-06 NOTE — Progress Notes (Signed)
Subjective:  Patient ID: Patricia Davies, female    DOB: 08-18-1945  Age: 72 y.o. MRN: [id number]  CC: Anemia; Hypertension; and Cough   HPI Patricia Davies presents for f/up - She continues to occasionally bring up blood.  The last episode this happened was about 4 days ago.  She notices blood in her spit but she does not know if she is coughing it up or vomiting it up.  It happens very rarely.  She has maintained a good appetite and denies abdominal pain, melena, chest pain, or shortness of breath.  Outpatient Medications Prior to Visit  Medication Sig Dispense Refill  . potassium chloride SA (K-DUR,KLOR-CON) 20 MEQ tablet Take 1 tablet (20 mEq total) by mouth 2 (two) times daily. (Patient not taking: Reported on 05/06/2017) 180 tablet 1  . donepezil (ARICEPT) 5 MG tablet Take 1/2 tablet daily for 1 month, then increase to 1 tablet daily (Patient not taking: Reported on 05/06/2017) 30 tablet 6  . umeclidinium-vilanterol (ANORO ELLIPTA) 62.5-25 MCG/INH AEPB Inhale 1 puff, once daily- maintenance (Patient not taking: Reported on 05/06/2017) 60 each 5   No facility-administered medications prior to visit.     ROS Review of Systems  Constitutional: Negative for appetite change, diaphoresis, fatigue and fever.  HENT: Negative.  Negative for facial swelling, sore throat and trouble swallowing.   Eyes: Negative for visual disturbance.  Respiratory: Positive for cough. Negative for chest tightness, shortness of breath and wheezing.   Cardiovascular: Negative for chest pain, palpitations and leg swelling.  Gastrointestinal: Negative for abdominal pain, diarrhea, nausea and vomiting.  Endocrine: Negative.   Genitourinary: Negative.  Negative for difficulty urinating.  Musculoskeletal: Negative.  Negative for arthralgias and myalgias.  Skin: Negative.  Negative for color change and pallor.  Allergic/Immunologic: Negative.   Neurological: Negative.  Negative for dizziness, weakness, light-headedness and  headaches.  Hematological: Negative for adenopathy. Does not bruise/bleed easily.  Psychiatric/Behavioral: Negative.     Objective:  BP (!) 158/80 (BP Location: Left Arm, Patient Position: Sitting, Cuff Size: Normal)   Pulse 65   Temp 97.6 F (36.4 C) (Oral)   Resp 16   Ht 5\' 2"  (1.575 m)   Wt 100 lb 8 oz (45.6 kg)   SpO2 97%   BMI 18.38 kg/m   BP Readings from Last 3 Encounters:  05/06/17 (!) 158/80  04/21/17 124/72  02/27/17 138/86    Wt Readings from Last 3 Encounters:  05/06/17 100 lb 8 oz (45.6 kg)  04/21/17 99 lb 12.8 oz (45.3 kg)  12/19/16 124 lb 6.4 oz (56.4 kg)    Physical Exam  Constitutional: She is oriented to person, place, and time. No distress.  HENT:  Mouth/Throat: Oropharynx is clear and moist. No oropharyngeal exudate.  Eyes: Conjunctivae are normal. Left eye exhibits no discharge. No scleral icterus.  Neck: Normal range of motion. Neck supple. No JVD present. No thyromegaly present.  Cardiovascular: Normal rate, regular rhythm and normal heart sounds. Exam reveals no gallop.  No murmur heard. Pulmonary/Chest: Effort normal and breath sounds normal. No respiratory distress. She has no wheezes. She has no rales.  Abdominal: Soft. Bowel sounds are normal. She exhibits no distension and no mass. There is no tenderness. There is no guarding.  Musculoskeletal: Normal range of motion. She exhibits no edema, tenderness or deformity.  Lymphadenopathy:    She has no cervical adenopathy.  Neurological: She is alert and oriented to person, place, and time.  Skin: Skin is warm and dry. No  rash noted. She is not diaphoretic. No erythema. No pallor.  Vitals reviewed.   Lab Results  Component Value Date   WBC 5.8 05/06/2017   HGB 11.3 (L) 05/06/2017   HCT 34.6 (L) 05/06/2017   PLT 266.0 05/06/2017   GLUCOSE 94 05/06/2017   CHOL 116 08/07/2016   TRIG 156.0 (H) 08/07/2016   HDL 25.80 (L) 08/07/2016   LDLCALC 59 08/07/2016   ALT 29 08/07/2016   AST 19  08/07/2016   NA 141 05/06/2017   K 3.4 (L) 05/06/2017   CL 103 05/06/2017   CREATININE 0.58 05/06/2017   BUN 15 05/06/2017   CO2 33 (H) 05/06/2017   TSH 0.90 08/07/2016   INR 1.0 09/15/2014   HGBA1C 4.9 06/01/2015    Dg Chest 2 View  Result Date: 04/21/2017 CLINICAL DATA:  72 year old female with sarcoidosis. Subsequent encounter. EXAM: CHEST  2 VIEW COMPARISON:  Chest CT 08/08/2016 and earlier. FINDINGS: Bilateral bronchiectasis and upper lobe predominant pulmonary architectural distortion with volume loss. Stable lung volumes. Stable mediastinal contours. Patchy asymmetric right lower lung opacity has regressed since June. Otherwise bilateral pulmonary opacity is stable since June 2018. no pleural effusion. Stable visualized osseous structures. Negative visible bowel gas pattern. IMPRESSION: 1. Severe chronic lung disease compatible with sequelae of sarcoidosis, including bronchiectasis and upper lobe fibrosis. 2. Regressed right lower lung pulmonary opacity seen in June 2018. No new cardiopulmonary abnormality. Electronically Signed   By: Odessa FlemingH  Hall M.D.   On: 04/21/2017 10:29    Dg Chest 2 View  Result Date: 05/06/2017 CLINICAL DATA:  Hemoptysis for 1 week, history of COPD, history of sarcoid EXAM: CHEST  2 VIEW COMPARISON:  Chest x-ray of 04/21/2016 and CT chest of 08/08/2016 FINDINGS: Pleuroparenchymal opacities bilaterally primarily in the mid lungs and extending to the apices is stable in this patient with history of sarcoidosis. Some these coarse markings are due to bronchiectatic change when compared to the prior CT of the chest. No active infiltrate or pleural effusion is seen. Mediastinal and hilar contours are unchanged and the heart is within normal limits in size. No bony abnormality is seen. IMPRESSION: Stable pleural-parenchymal opacities in the mid and upper lungs to the apices in this patient with sarcoidosis. No definite active process. Electronically Signed   By: Dwyane DeePaul  Barry M.D.    On: 05/06/2017 13:59    Assessment & Plan:   Patricia Davies was seen today for anemia, hypertension and cough.  Diagnoses and all orders for this visit:  Hyperglycemia- improvement noted -     Basic metabolic panel; Future  Hypokalemia- She continues to have a low potassium level.  I have asked her to start taking spironolactone to stabilize her potassium level. -     Basic metabolic panel; Future  Essential hypertension- Her blood pressure is not adequately well controlled and she has a history of hypokalemia.  I have asked her to start taking spironolactone to control her blood pressure and to stabilize her potassium level. -     Basic metabolic panel; Future  Cough - Her chest x-ray is stable.  She has not coughed up blood for 4 days now.  She will continue to monitor where the blood is coming from and she will let me know if she wants to see an ENT doctor, gastroenterologist, or pulmonologist. -     DG Chest 2 View; Future  Abnormal CXR (chest x-ray)  Vitamin B12 deficiency neuropathy (HCC) -     CBC with Differential/Platelet; Future -  Folate; Future -     Vitamin B12; Future  Dietary folate deficiency anemia- She remains mildly anemic and continues to have a low folate level.  I have asked her to restart the folate supplement. -     CBC with Differential/Platelet; Future -     Folate; Future -     Vitamin B12; Future -     folic acid (FOLVITE) 1 MG tablet; Take 1 tablet (1 mg total) by mouth daily.  Deficiency anemia- as above -     IBC panel; Future -     Ferritin; Future   I have discontinued Patricia Davies's umeclidinium-vilanterol and donepezil. I am also having her start on folic acid. Additionally, I am having her maintain her potassium chloride SA.  Meds ordered this encounter  Medications  . folic acid (FOLVITE) 1 MG tablet    Sig: Take 1 tablet (1 mg total) by mouth daily.    Dispense:  90 tablet    Refill:  1     Follow-up: Return in about 4 weeks (around  06/03/2017).  Sanda Linger, MD

## 2017-05-08 MED ORDER — SPIRONOLACTONE 25 MG PO TABS: 25.0000 mg | ORAL_TABLET | Freq: Every day | ORAL | 0 refills | Status: DC

## 2017-06-02 ENCOUNTER — Ambulatory Visit: Payer: Medicare Other | Admitting: Neurology

## 2017-08-10 ENCOUNTER — Other Ambulatory Visit: Payer: Self-pay | Admitting: Internal Medicine

## 2017-08-10 DIAGNOSIS — E876 Hypokalemia: Secondary | ICD-10-CM

## 2017-08-10 DIAGNOSIS — I10 Essential (primary) hypertension: Secondary | ICD-10-CM

## 2017-10-30 ENCOUNTER — Telehealth: Payer: Self-pay

## 2017-10-30 NOTE — Telephone Encounter (Signed)
Per PCP okay to write letter to pt employer. Letter completed, signed and faxed as requested.

## 2017-10-30 NOTE — Telephone Encounter (Signed)
Patricia CavaMaderia Davies, from [employer], the patient's employer called requesting information from Dr. Yetta BarreJones is the patient mentally and psychically competent. The paper work received stated that the patient indicates that SHE is mentally and physically sound. Is Dr. Yetta BarreJones able to resend that form that HE recommends that she is sound.  [phone] and email [email or web address]

## 2017-10-30 NOTE — Telephone Encounter (Signed)
Copied from CRM [id number]. Topic: General - Other >> Oct 30, 2017 11:15 AM Gaynelle AduPoole, Shalonda wrote: Reason for CRM: Patient is calling to request documentation for her new employer that she is clear to work, and no longer under doctors care.  She is requesting the information be sent to Mr.lewis fax number [phone].  The patient best contact number is [phone].

## 2017-10-31 NOTE — Telephone Encounter (Signed)
Ferdinand CavaMaderia Lewis calling back and states that last time a form was not sent to the office that a note/memo was sent to her. But she will fax a form over to be completed. CB#: [phone]. Fax#: [phone]

## 2017-10-31 NOTE — Telephone Encounter (Signed)
I do not have a form/paperwork to fill out, or a number to call Patricia Davies to have another form resent.

## 2017-11-10 ENCOUNTER — Ambulatory Visit: Payer: Medicare HMO | Admitting: Internal Medicine

## 2017-11-11 ENCOUNTER — Encounter: Payer: Self-pay | Admitting: Internal Medicine

## 2017-11-11 ENCOUNTER — Other Ambulatory Visit (INDEPENDENT_AMBULATORY_CARE_PROVIDER_SITE_OTHER): Payer: Medicare HMO

## 2017-11-11 ENCOUNTER — Ambulatory Visit (INDEPENDENT_AMBULATORY_CARE_PROVIDER_SITE_OTHER): Payer: Medicare HMO | Admitting: Internal Medicine

## 2017-11-11 VITALS — BP 148/80 | HR 61 | Temp 97.9°F | Resp 16 | Ht 62.0 in | Wt 94.5 lb

## 2017-11-11 DIAGNOSIS — G63 Polyneuropathy in diseases classified elsewhere: Secondary | ICD-10-CM

## 2017-11-11 DIAGNOSIS — J449 Chronic obstructive pulmonary disease, unspecified: Secondary | ICD-10-CM | POA: Diagnosis not present

## 2017-11-11 DIAGNOSIS — D520 Dietary folate deficiency anemia: Secondary | ICD-10-CM

## 2017-11-11 DIAGNOSIS — E876 Hypokalemia: Secondary | ICD-10-CM

## 2017-11-11 DIAGNOSIS — E538 Deficiency of other specified B group vitamins: Secondary | ICD-10-CM | POA: Diagnosis not present

## 2017-11-11 DIAGNOSIS — I10 Essential (primary) hypertension: Secondary | ICD-10-CM

## 2017-11-11 DIAGNOSIS — E785 Hyperlipidemia, unspecified: Secondary | ICD-10-CM | POA: Diagnosis not present

## 2017-11-11 DIAGNOSIS — E440 Moderate protein-calorie malnutrition: Secondary | ICD-10-CM | POA: Diagnosis not present

## 2017-11-11 DIAGNOSIS — Z23 Encounter for immunization: Secondary | ICD-10-CM

## 2017-11-11 DIAGNOSIS — Z0000 Encounter for general adult medical examination without abnormal findings: Secondary | ICD-10-CM | POA: Insufficient documentation

## 2017-11-11 DIAGNOSIS — Z0001 Encounter for general adult medical examination with abnormal findings: Secondary | ICD-10-CM | POA: Diagnosis not present

## 2017-11-11 LAB — CBC WITH DIFFERENTIAL/PLATELET
BASOS ABS: 0 10*3/uL (ref 0.0–0.1)
Basophils Relative: 0.5 % (ref 0.0–3.0)
EOS PCT: 3.5 % (ref 0.0–5.0)
Eosinophils Absolute: 0.2 10*3/uL (ref 0.0–0.7)
HCT: 36.5 % (ref 36.0–46.0)
HEMOGLOBIN: 11.9 g/dL — AB (ref 12.0–15.0)
LYMPHS PCT: 52 % — AB (ref 12.0–46.0)
Lymphs Abs: 3.2 10*3/uL (ref 0.7–4.0)
MCHC: 32.6 g/dL (ref 30.0–36.0)
MCV: 92.9 fl (ref 78.0–100.0)
Monocytes Absolute: 0.7 10*3/uL (ref 0.1–1.0)
Monocytes Relative: 11.3 % (ref 3.0–12.0)
Neutro Abs: 2 10*3/uL (ref 1.4–7.7)
Neutrophils Relative %: 32.7 % — ABNORMAL LOW (ref 43.0–77.0)
Platelets: 242 10*3/uL (ref 150.0–400.0)
RBC: 3.93 Mil/uL (ref 3.87–5.11)
RDW: 12.8 % (ref 11.5–15.5)
WBC: 6.2 10*3/uL (ref 4.0–10.5)

## 2017-11-11 LAB — LIPID PANEL
CHOLESTEROL: 158 mg/dL (ref 0–200)
HDL: 66.7 mg/dL (ref >39.00)
LDL CALC: 76 mg/dL (ref 0–99)
NONHDL: 91.5
Total CHOL/HDL Ratio: 2
Triglycerides: 80 mg/dL (ref 0.0–149.0)
VLDL: 16 mg/dL (ref 0.0–40.0)

## 2017-11-11 LAB — BASIC METABOLIC PANEL
BUN: 12 mg/dL (ref 6–23)
CALCIUM: 9.6 mg/dL (ref 8.4–10.5)
CO2: 32 mEq/L (ref 19–32)
Chloride: 104 mEq/L (ref 96–112)
Creatinine, Ser: 0.72 mg/dL (ref 0.40–1.20)
GFR: 102.87 mL/min (ref >60.00)
Glucose, Bld: 83 mg/dL (ref 70–99)
POTASSIUM: 4 meq/L (ref 3.5–5.1)
SODIUM: 140 meq/L (ref 135–145)

## 2017-11-11 LAB — VITAMIN B12: Vitamin B-12: 435 pg/mL (ref 211–911)

## 2017-11-11 LAB — FOLATE: Folate: 9.7 ng/mL (ref >5.9)

## 2017-11-11 MED ORDER — DRONABINOL 2.5 MG PO CAPS: 2.5000 mg | ORAL_CAPSULE | Freq: Two times a day (BID) | ORAL | 0 refills | Status: DC

## 2017-11-11 NOTE — Patient Instructions (Signed)

## 2017-11-11 NOTE — Progress Notes (Signed)
Subjective:  Patient ID: Patricia Davies, female    DOB: 1946/02/27  Age: 72 y.o. MRN: [id number]  CC: Anemia; Hypertension; Hyperlipidemia; and Annual Exam   HPI CHANELL NADEAU presents for a CPX.  She complains of poor appetite and is losing weight.  She tells me she is not aware she had high blood pressure and is not currently taking an antihypertensive.  Past Medical History:  Diagnosis Date  . COPD (chronic obstructive pulmonary disease) (HCC)   . History of nephrolithiasis   . Hypertension   . Sarcoidosis of skin   . Vitamin B12 deficiency    Past Surgical History:  Procedure Laterality Date  . ABDOMINAL HYSTERECTOMY    . CHOLECYSTECTOMY      reports that she quit smoking about 27 years ago. Her smoking use included cigarettes. She has a 10.00 pack-year smoking history. She has never used smokeless tobacco. She reports that she drinks about 1.0 standard drinks of alcohol per week. She reports that she does not use drugs. family history includes Diabetes in her maternal aunt; Hypertension in her maternal aunt. No Known Allergies  Outpatient Medications Prior to Visit  Medication Sig Dispense Refill  . folic acid (FOLVITE) 1 MG tablet Take 1 tablet (1 mg total) by mouth daily. 90 tablet 1  . potassium chloride SA (K-DUR,KLOR-CON) 20 MEQ tablet Take 1 tablet (20 mEq total) by mouth 2 (two) times daily. 180 tablet 1  . spironolactone (ALDACTONE) 25 MG tablet TAKE 1 TABLET BY MOUTH EVERY DAY 90 tablet 0   No facility-administered medications prior to visit.     ROS Review of Systems  Constitutional: Positive for unexpected weight change (wt loss). Negative for appetite change, diaphoresis and fatigue.  HENT: Negative.  Negative for trouble swallowing.   Eyes: Negative for visual disturbance.  Respiratory: Negative for cough, chest tightness, shortness of breath and wheezing.   Cardiovascular: Negative for chest pain, palpitations and leg swelling.  Gastrointestinal: Negative  for abdominal pain, constipation, diarrhea, nausea and vomiting.  Endocrine: Negative.   Genitourinary: Negative.  Negative for decreased urine volume, difficulty urinating, dysuria, frequency and urgency.  Musculoskeletal: Negative.  Negative for arthralgias, back pain, myalgias and neck pain.  Skin: Negative.   Allergic/Immunologic: Negative.   Neurological: Negative.  Negative for dizziness, weakness and light-headedness.  Hematological: Negative for adenopathy. Does not bruise/bleed easily.  Psychiatric/Behavioral: Negative.     Objective:  BP (!) 148/80 (BP Location: Left Arm, Patient Position: Sitting, Cuff Size: Small)   Pulse 61   Temp 97.9 F (36.6 C) (Oral)   Resp 16   Ht 5\' 2"  (1.575 m)   Wt 94 lb 8 oz (42.9 kg)   SpO2 98%   BMI 17.28 kg/m   BP Readings from Last 3 Encounters:  11/11/17 (!) 148/80  05/06/17 (!) 158/80  04/21/17 124/72    Wt Readings from Last 3 Encounters:  11/11/17 94 lb 8 oz (42.9 kg)  05/06/17 100 lb 8 oz (45.6 kg)  04/21/17 99 lb 12.8 oz (45.3 kg)    Physical Exam  Constitutional: She is oriented to person, place, and time. No distress.  HENT:  Mouth/Throat: Oropharynx is clear and moist. No oropharyngeal exudate.  Eyes: Conjunctivae are normal. No scleral icterus.  Neck: Normal range of motion. Neck supple. No JVD present. No thyromegaly present.  Cardiovascular: Normal rate, regular rhythm and normal heart sounds. Exam reveals no gallop and no friction rub.  No murmur heard. Pulmonary/Chest: Effort normal and breath sounds  normal. No respiratory distress. She has no wheezes. She has no rales.  Abdominal: Soft. Normal appearance and bowel sounds are normal. She exhibits no mass. There is no hepatosplenomegaly. There is no tenderness.  Musculoskeletal: Normal range of motion. She exhibits no edema, tenderness or deformity.  Lymphadenopathy:    She has no cervical adenopathy.  Neurological: She is alert and oriented to person, place, and  time.  Skin: Skin is warm and dry. She is not diaphoretic. No pallor.  Psychiatric: She has a normal mood and affect. Her behavior is normal. Judgment and thought content normal.  Vitals reviewed.   Lab Results  Component Value Date   WBC 6.2 11/11/2017   HGB 11.9 (L) 11/11/2017   HCT 36.5 11/11/2017   PLT 242.0 11/11/2017   GLUCOSE 83 11/11/2017   CHOL 158 11/11/2017   TRIG 80.0 11/11/2017   HDL 66.70 11/11/2017   LDLCALC 76 11/11/2017   ALT 29 08/07/2016   AST 19 08/07/2016   NA 140 11/11/2017   K 4.0 11/11/2017   CL 104 11/11/2017   CREATININE 0.72 11/11/2017   BUN 12 11/11/2017   CO2 32 11/11/2017   TSH 0.90 08/07/2016   INR 1.0 09/15/2014   HGBA1C 4.9 06/01/2015    Dg Chest 2 View  Result Date: 05/06/2017 CLINICAL DATA:  Hemoptysis for 1 week, history of COPD, history of sarcoid EXAM: CHEST  2 VIEW COMPARISON:  Chest x-ray of 04/21/2016 and CT chest of 08/08/2016 FINDINGS: Pleuroparenchymal opacities bilaterally primarily in the mid lungs and extending to the apices is stable in this patient with history of sarcoidosis. Some these coarse markings are due to bronchiectatic change when compared to the prior CT of the chest. No active infiltrate or pleural effusion is seen. Mediastinal and hilar contours are unchanged and the heart is within normal limits in size. No bony abnormality is seen. IMPRESSION: Stable pleural-parenchymal opacities in the mid and upper lungs to the apices in this patient with sarcoidosis. No definite active process. Electronically Signed   By: Dwyane Dee M.D.   On: 05/06/2017 13:59    Assessment & Plan:   Gabriell was seen today for anemia, hypertension, hyperlipidemia and annual exam.  Diagnoses and all orders for this visit:  Dietary folate deficiency anemia- Her H&H have improved.  Her folate and B12 levels are normal now. -     CBC with Differential/Platelet; Future -     Vitamin B12; Future -     Folate; Future  Hypokalemia- Her potassium  level is normal now, -     Basic metabolic panel; Future  Essential hypertension- Her BP is well controlled.  She is not taking spironolactone so I will discontinue it. -     Basic metabolic panel; Future  Hyperlipidemia LDL goal <130- Her ASCVD risk score is less than 15% so I do not recommend a statin for CV risk reduction. -     Lipid panel; Future  Vitamin B12 deficiency neuropathy (HCC)  Need for influenza vaccination -     Flu vaccine HIGH DOSE PF (Fluzone High dose)  Need for pneumococcal vaccination -     Pneumococcal polysaccharide vaccine 23-valent greater than or equal to 2yo subcutaneous/IM  Protein-calorie malnutrition, moderate (HCC) -     dronabinol (MARINOL) 2.5 MG capsule; Take 1 capsule (2.5 mg total) by mouth 2 (two) times daily before lunch and supper.  Routine general medical examination at a health care facility   I am having Elmon Else start  on dronabinol. I am also having her maintain her potassium chloride SA, folic acid, and spironolactone.  Meds ordered this encounter  Medications  . dronabinol (MARINOL) 2.5 MG capsule    Sig: Take 1 capsule (2.5 mg total) by mouth 2 (two) times daily before lunch and supper.    Dispense:  180 capsule    Refill:  0   See AVS for instructions about healthy living and anticipatory guidance.  Follow-up: Return in about 3 months (around 02/10/2018).  Sanda Linger, MD

## 2017-11-12 NOTE — Assessment & Plan Note (Signed)

## 2017-11-13 NOTE — Telephone Encounter (Signed)
Patricia Davies calling and is checking the status of the paperwork that is supposed to be sent to her. States that she is needing to know Dr Yetta BarreJones medical opinion on whether this patient is mentally and physically sound. States that she is needing this asap, that she does not want to put the patient with someone else's children and her not be fit to do so. Please advise.

## 2017-11-13 NOTE — Telephone Encounter (Signed)
Pt says that she came in yesterday to have her CPE and everything that was needed for her to return to work. Pt would like to know if PCP was able to have completed paperwork faxed to her job?   Please advise.   CB: [phone]

## 2017-11-14 NOTE — Telephone Encounter (Signed)
Form has been refaxed.

## 2017-11-14 NOTE — Telephone Encounter (Signed)
Form has been faxed.

## 2017-11-14 NOTE — Telephone Encounter (Signed)
Form has been completed.

## 2017-11-14 NOTE — Telephone Encounter (Signed)
They did not receive the forms please resend

## 2017-11-17 ENCOUNTER — Telehealth: Payer: Self-pay | Admitting: Internal Medicine

## 2017-11-17 NOTE — Telephone Encounter (Signed)
Copied from CRM [id number]. Topic: Quick Communication - See Telephone Encounter >> Nov 17, 2017 11:09 AM Lorrine KinMcGee, Oda Placke B, NT wrote: CRM for notification. See Telephone encounter for: 11/17/17. Patient calling and is worried about why she has not started back work. States that Mrs Melvyn NethLewis is telling her that something is not completed, but the patient is unsure what that is. Please advise. Patient would like to know what is going on.

## 2017-11-17 NOTE — Telephone Encounter (Signed)
Spoke to Patricia Davies and corrected portion of the form and refaxed. Contacted pt and informed of same.

## 2018-12-07 ENCOUNTER — Ambulatory Visit: Payer: Medicare HMO | Admitting: Internal Medicine

## 2018-12-15 ENCOUNTER — Encounter: Payer: Medicare PPO | Admitting: Internal Medicine

## 2019-05-19 ENCOUNTER — Telehealth: Payer: Self-pay | Admitting: Internal Medicine

## 2019-05-19 NOTE — Telephone Encounter (Signed)
Called and spoke to pt. Pt c/o in SOB, prod cough with white and yellow mucus, chest tightness. Pt last seen in 04/2017 by Dr. Maple Hudson. Televisit scheduled with CY on 3/18. Pt verbalized understanding and denied any further questions or concerns at this time.

## 2019-05-20 ENCOUNTER — Ambulatory Visit (INDEPENDENT_AMBULATORY_CARE_PROVIDER_SITE_OTHER): Payer: Medicare PPO | Admitting: Internal Medicine

## 2019-05-20 ENCOUNTER — Other Ambulatory Visit: Payer: Self-pay

## 2019-05-20 ENCOUNTER — Encounter: Payer: Self-pay | Admitting: Internal Medicine

## 2019-05-20 DIAGNOSIS — J449 Chronic obstructive pulmonary disease, unspecified: Secondary | ICD-10-CM | POA: Diagnosis not present

## 2019-05-20 DIAGNOSIS — W19XXXA Unspecified fall, initial encounter: Secondary | ICD-10-CM | POA: Insufficient documentation

## 2019-05-20 NOTE — Assessment & Plan Note (Signed)
She describes very limited walking for errands due to chronic dyspnea. We haven't seen her recently  Plan- appointment for office follow-up.

## 2019-05-20 NOTE — Assessment & Plan Note (Signed)
Describes slip and fall on wet kitchen floor yesterday with bruising and sore, swollen right leg. I insisted she needed to direct evaluation and she agrees to go to [facility] or ED for in-person evaluation.

## 2019-05-20 NOTE — Progress Notes (Signed)
HPI female former smoker followed for Sarcoid stage IV, COPD, history pneumonia, bronchiectasis and hemoptysis in the past. Original BX 2008 bronchoscopy, treated then with prednisone CT chest 08/18/16- new Mycetoma PFT: 04/04/2011-moderate obstructive airways disease with insignificant response to dilator, diffusion severely reduced c/w emphysema. FEV1/FVC 0.52, DLCO 43%. 6 minut.e walk test: 04/04/2011 97%, dropping to 94% and a block, rebounded to 98% with 2 minutes rest. 312 m ACE 33 01/31/11 ACE 08/30/2014-37 Office Spirometry 08/15/16-unreliable effort-moderate obstruction-FVC 1.79/82%, FEV1 1.01/60%, ratio 0.56, FEF 25-75% 0.33/21% --------------------------------------------------------------------------------------------------  04/21/17- 74 year old female former smoker followed for Sarcoid stage IV, COPD, history pneumonia, bronchiectasis and hemoptysis, complicated by HBP Original BX 2008 bronchoscopy, treated then with prednisone. Mycetoma LUL cavity. ----4 month ROV  Has lost weight-124 pounds in October, 99 pounds now.  Not eating. Anoro not being used regularly. Daughter here says she sees mother every day but they do not live together.  Daughter is concerned about mother's memory and mother could not report what she had for breakfast today, if anything. Mother is aware of weight loss but denies pain, adenopathy, fever, night sweat, cough or wheeze.  05/20/19- Virtual Visit via Telephone Note  I connected with Patricia Davies on 05/20/19 at 11:30 AM EDT by telephone and verified that I am speaking with the correct person using two identifiers.  Location: Patient: H Provider: O  I discussed the limitations, risks, security and privacy concerns of performing an evaluation and management service by telephone and the availability of in person appointments. I also discussed with the patient that there may be a patient responsible charge related to this service. The patient expressed  understanding and agreed to proceed.   History of Present Illness: 74 year old female former smoker followed for Sarcoid stage IV, COPD, history pneumonia, bronchiectasis and hemoptysis, complicated by HBP Original BX 2008 bronchoscopy, treated then with prednisone. Mycetoma LUL cavity. Patient called office reporting SOB, productive cough, white and yellow mucus, chest tightness. She is chronically limited by DOE, walking little., but not recently changed. Not on meds for breathing. Yesterday she slipped on wet kitchen floor and fell hard, hurting right side and right leg. Doesn't think anything is broken, but her friend had to pick her up and she is moving around home using his cane and holding furniture. Today her right leg is bruised and swollen so she put alcohol on it. She says she has not been having increased dyspnea, fever or leg swelling prior to the fall.    Observations/Objective: Conversational, not in apparent acute distress or overtly dyspneic on phone.   CXR 05/06/17-  IMPRESSION: Stable pleural-parenchymal opacities in the mid and upper lungs to the apices in this patient with sarcoidosis. No definite active process.  Assessment and Plan: Acute leg pain and swelling after fall- She is to go to UC or ED for eval of trauma from fall. COPD mixed type with hx Sarcoid- I got inconsistent story from her, but can't tell that she has significant resp infection now. Doubt PE, but needs to be seen in -person. - Again, plan is for her to go for in-person evaluation at [facility] or ED. See [place] for resumption of pulmonary management in 1-2 months.  Follow Up Instructions: 2 months   I discussed the assessment and treatment plan with the patient. The patient was provided an opportunity to ask questions and all were answered. The patient agreed with the plan and demonstrated an understanding of the instructions.   The patient was advised to call back or  seek an in-person evaluation if the  symptoms worsen or if the condition fails to improve as anticipated.  I provided 18 minutes of non-face-to-face time during this encounter.   Jetty Duhamel, MD ------------------------------------------------------------------------------------------  ROS-see HPI   + = positive Constitutional:   +   weight loss, night sweats, fevers, chills, fatigue, lassitude. HEENT:   No-  headaches, difficulty swallowing, tooth/dental problems, sore throat,         nasal congestion, post nasal drip,  CV:  No-   chest pain, orthopnea, PND, swelling in lower extremities, anasarca, dizziness, palpitations Resp:  +shortness of breath with exertion or at rest.              No-   productive cough,   non-productive cough,   coughing up of blood.              No-   change in color of mucus.  No- wheezing.   Skin: No-   rash or lesions. GI:  No-   heartburn, indigestion, abdominal pain, nausea, vomiting, diarrhea,                 change in bowel habits, loss of appetite GU:  MS:  No-   joint pain or swelling.  No- decreased range of motion.  No- back pain. Neuro-     + Per HPI Psych:  No- change in mood or affect. No depression or anxiety.  + memory loss.  OBJ General- Alert, Oriented, Affect-appropriate, Distress- none acute, trim, +O2 room air saturation 95% Skin- + postherpetic hyperpigmentation right flank Lymphadenopathy- none Head- atraumatic            Eyes- Gross vision intact, PERRLA, conjunctivae             Ears- Hearing, canals-normal            Nose- Clear, no-Septal dev, mucus, polyps, erosion, perforation             Throat- Mallampati II , mucosa clear , drainage- none, tonsils- atrophic Neck- flexible , trachea midline, no stridor , thyroid nl, carotid no bruit Chest - symmetrical excursion , unlabored           Heart/CV- RRR , no murmur , no gallop  , no rub, nl s1 s2                           - JVD- none , edema- none, stasis changes- none, varices- none           Lung- + few  crackles/unlabored, wheeze- none, cough- none , dullness-none, rub- none           Chest wall-  Abd- Br/ Gen/ Rectal- Not done, not indicated Extrem- cyanosis- none, clubbing, none, atrophy- none, strength- nl Neuro- grossly intact to observation, grip seems normal

## 2019-05-20 NOTE — Patient Instructions (Signed)
It is important that you go to an Urgent Care or Emergency Room to be evaluated for the injuries from your fall yesterday and to make sure there isn't something else going on, like an infection or blood clot.  We are making an appointment to see you here at our office to help with your long-term shortness of breath problem.

## 2019-07-22 ENCOUNTER — Ambulatory Visit (INDEPENDENT_AMBULATORY_CARE_PROVIDER_SITE_OTHER): Payer: Medicare PPO

## 2019-07-22 ENCOUNTER — Ambulatory Visit (INDEPENDENT_AMBULATORY_CARE_PROVIDER_SITE_OTHER): Payer: Medicare PPO | Admitting: Internal Medicine

## 2019-07-22 ENCOUNTER — Other Ambulatory Visit: Payer: Self-pay

## 2019-07-22 ENCOUNTER — Encounter: Payer: Self-pay | Admitting: Internal Medicine

## 2019-07-22 VITALS — BP 120/64 | HR 68 | Temp 98.3°F | Ht 62.0 in | Wt 109.0 lb

## 2019-07-22 DIAGNOSIS — J449 Chronic obstructive pulmonary disease, unspecified: Secondary | ICD-10-CM

## 2019-07-22 DIAGNOSIS — D869 Sarcoidosis, unspecified: Secondary | ICD-10-CM

## 2019-07-22 DIAGNOSIS — W19XXXD Unspecified fall, subsequent encounter: Secondary | ICD-10-CM

## 2019-07-22 NOTE — Progress Notes (Signed)
HPI female former smoker followed for Sarcoid stage IV, COPD, history pneumonia, bronchiectasis and hemoptysis in the past. Original BX 2008 bronchoscopy, treated then with prednisone CT chest 08/18/16- new Mycetoma PFT: 04/04/2011-moderate obstructive airways disease with insignificant response to dilator, diffusion severely reduced c/w emphysema. FEV1/FVC 0.52, DLCO 43%. 6 minut.e walk test: 04/04/2011 97%, dropping to 94% and a block, rebounded to 98% with 2 minutes rest. 312 m ACE 33 01/31/11 ACE 08/30/2014-37 Office Spirometry 08/15/16-unreliable effort-moderate obstruction-FVC 1.79/82%, FEV1 1.01/60%, ratio 0.56, FEF 25-75% 0.33/21% --------------------------------------------------------------------------------------------------   05/20/19- Virtual Visit via Telephone Note  History of Present Illness: 74 year old female former smoker followed for Sarcoid stage IV, COPD, history pneumonia, bronchiectasis and hemoptysis, complicated by HBP Original BX 2008 bronchoscopy, treated then with prednisone. Mycetoma LUL cavity. Patient called office reporting SOB, productive cough, white and yellow mucus, chest tightness. She is chronically limited by DOE, walking little., but not recently changed. Not on meds for breathing. Yesterday she slipped on wet kitchen floor and fell hard, hurting right side and right leg. Doesn't think anything is broken, but her friend had to pick her up and she is moving around home using his cane and holding furniture. Today her right leg is bruised and swollen so she put alcohol on it. She says she has not been having increased dyspnea, fever or leg swelling prior to the fall.    Observations/Objective: Conversational, not in apparent acute distress or overtly dyspneic on phone.   CXR 05/06/17-  IMPRESSION: Stable pleural-parenchymal opacities in the mid and upper lungs to the apices in this patient with sarcoidosis. No definite active process.  Assessment and  Plan: Acute leg pain and swelling after fall- She is to go to UC or ED for eval of trauma from fall. COPD mixed type with hx Sarcoid- I got inconsistent story from her, but can't tell that she has significant resp infection now. Doubt PE, but needs to be seen in -person. - Again, plan is for her to go for in-person evaluation at [facility] or ED. See [place] for resumption of pulmonary management in 1-2 months.  Follow Up Instructions: 2 months  07/22/19- 73 year old female former smoker followed for Sarcoid stage IV, COPD, history pneumonia, bronchiectasis and hemoptysis, complicated by HBP Original BX 2008 bronchoscopy, treated then with prednisone. Mycetoma LUL cavity. -----f/u COPD mixed type. No Covax yet Still sore across lower anterior right ribs from fall at home in March. She didn't go to UC as instructed at that time.  Soreness is variable, more with twisting than breathing. Not limiting.   ------------------------------------------------------------------------------------------  ROS-see HPI   + = positive Constitutional:   +   weight loss, night sweats, fevers, chills, fatigue, lassitude. HEENT:   No-  headaches, difficulty swallowing, tooth/dental problems, sore throat,         nasal congestion, post nasal drip,  CV:  +  chest pain, orthopnea, PND, swelling in lower extremities, anasarca, dizziness, palpitations Resp:  +shortness of breath with exertion or at rest.              No-   productive cough,   non-productive cough,   coughing up of blood.              No-   change in color of mucus.  No- wheezing.   Skin: No-   rash or lesions. GI:  No-   heartburn, indigestion, abdominal pain, nausea, vomiting, diarrhea,  change in bowel habits, loss of appetite GU:  MS:  No-   joint pain or swelling.  No- decreased range of motion.  No- back pain. Neuro-     + Per HPI Psych:  No- change in mood or affect. No depression or anxiety.  + memory loss.  OBJ General- Alert, Oriented,  Affect-appropriate, Distress- none acute, trim,  room air saturation 99% Skin- + postherpetic hyperpigmentation right flank Lymphadenopathy- none Head- atraumatic            Eyes- Gross vision intact, PERRLA, conjunctivae             Ears- Hearing, canals-normal            Nose- Clear, no-Septal dev, mucus, polyps, erosion, perforation             Throat- Mallampati II , mucosa clear , drainage- none, tonsils- atrophic Neck- flexible , trachea midline, no stridor , thyroid nl, carotid no bruit Chest - symmetrical excursion , unlabored           Heart/CV- RRR , no murmur , no gallop  , no rub, nl s1 s2                           - JVD- none , edema- none, stasis changes- none, varices- none           Lung- + few crackles/unlabored, wheeze- none, cough+ light , dullness-none, rub- none           Chest wall- + mild tenderness to pressure along R anterior costal margin. No visible bruis, no crepitus.  Abd- Br/ Gen/ Rectal- Not done, not indicated Extrem- cyanosis- none, clubbing, none, atrophy- none, strength- nl Neuro- grossly intact to observation, grip seems normal

## 2019-07-22 NOTE — Patient Instructions (Addendum)
Order- CXR  Dx Sarcoid, fell hurt right lower anterior chest  Please call if we can help

## 2019-07-23 NOTE — Assessment & Plan Note (Signed)
Residual soreness R anterior ribs after fall March, 2021. Doubt significant injury but she's still tender. Plan- CXR

## 2019-07-23 NOTE — Assessment & Plan Note (Signed)
Residual scarring but doubt any active systemic disease now.

## 2019-08-18 ENCOUNTER — Ambulatory Visit: Payer: Medicare PPO | Attending: Family

## 2019-08-18 DIAGNOSIS — Z23 Encounter for immunization: Secondary | ICD-10-CM

## 2019-08-18 NOTE — Progress Notes (Signed)
   Covid-19 Vaccination Clinic  Name:  Patricia Davies    MRN: [id number] DOB: 10-22-1945  08/18/2019  [name] was observed post Covid-19 immunization for 15 minutes without incident. She was provided with Vaccine Information Sheet and instruction to access the V-Safe system.   [name] was instructed to call 911 with any severe reactions post vaccine: Marland Kitchen Difficulty breathing  . Swelling of face and throat  . A fast heartbeat  . A bad rash all over body  . Dizziness and weakness   Immunizations Administered    Name Date Dose VIS Date Route   Pfizer COVID-19 Vaccine 08/18/2019  2:36 PM 0.3 mL 04/28/2018 Intramuscular   Manufacturer: [employer], [employer]   Lot: [id number]   NDC: [address]-7

## 2019-09-15 ENCOUNTER — Ambulatory Visit: Payer: Medicare PPO

## 2019-09-15 DIAGNOSIS — Z23 Encounter for immunization: Secondary | ICD-10-CM

## 2019-09-15 NOTE — Progress Notes (Signed)
   Covid-19 Vaccination Clinic  Name:  BREIA OCAMPO    MRN: [id number] DOB: May 24, 1945  09/15/2019  Ms. Jaycox was observed post Covid-19 immunization for 15 minutes without incident. She was provided with Vaccine Information Sheet and instruction to access the V-Safe system.   Ms. Duross was instructed to call 911 with any severe reactions post vaccine: Marland Kitchen Difficulty breathing  . Swelling of face and throat  . A fast heartbeat  . A bad rash all over body  . Dizziness and weakness   Immunizations Administered    Name Date Dose VIS Date Route   Pfizer COVID-19 Vaccine 09/15/2019  2:23 PM 0.3 mL 04/28/2018 Intramuscular   Manufacturer: [employer], [employer]   Lot: [id number]   NDC: [address]-0

## 2020-07-20 NOTE — Progress Notes (Deleted)
HPI female former smoker followed for Sarcoid stage IV, COPD, history pneumonia, bronchiectasis and hemoptysis in the past. Original BX 2008 bronchoscopy, treated then with prednisone CT chest 08/18/16- new Mycetoma PFT: 04/04/2011-moderate obstructive airways disease with insignificant response to dilator, diffusion severely reduced c/w emphysema. FEV1/FVC 0.52, DLCO 43%. 6 minut.e walk test: 04/04/2011 97%, dropping to 94% and a block, rebounded to 98% with 2 minutes rest. 312 m ACE 33 01/31/11 ACE 08/30/2014-37 Office Spirometry 08/15/16-unreliable effort-moderate obstruction-FVC 1.79/82%, FEV1 1.01/60%, ratio 0.56, FEF 25-75% 0.33/21% --------------------------------------------------------------------------------------------------   07/22/19- 75 year old female former smoker followed for Sarcoid stage IV, COPD, history pneumonia, bronchiectasis and hemoptysis, complicated by HBP Original BX 2008 bronchoscopy, treated then with prednisone. Mycetoma LUL cavity. -----f/u COPD mixed type. No Covax yet Still sore across lower anterior right ribs from fall at home in March. She didn't go to UC as instructed at that time.  Soreness is variable, more with twisting than breathing. Not limiting.   07/21/20- 75 year old female former smoker followed for Sarcoid stage IV, COPD, history pneumonia, bronchiectasis and hemoptysis, complicated by HBP Original BX 2008 bronchoscopy, treated then with prednisone. Mycetoma LUL cavity.   CXR 07/22/19-  IMPRESSION: 1. Stable changes of nodular pleural-parenchymal thickening with retraction of the hila. These findings are consistent patient's known sarcoidosis. No acute cardiopulmonary disease identified. 2.  No acute bony abnormality identified.  No pneumothorax.    ROS-see HPI   + = positive Constitutional:   +   weight loss, night sweats, fevers, chills, fatigue, lassitude. HEENT:   No-  headaches, difficulty swallowing, tooth/dental problems, sore  throat,         nasal congestion, post nasal drip,  CV:  +  chest pain, orthopnea, PND, swelling in lower extremities, anasarca, dizziness, palpitations Resp:  +shortness of breath with exertion or at rest.              No-   productive cough,   non-productive cough,   coughing up of blood.              No-   change in color of mucus.  No- wheezing.   Skin: No-   rash or lesions. GI:  No-   heartburn, indigestion, abdominal pain, nausea, vomiting, diarrhea,                 change in bowel habits, loss of appetite GU:  MS:  No-   joint pain or swelling.  No- decreased range of motion.  No- back pain. Neuro-     + Per HPI Psych:  No- change in mood or affect. No depression or anxiety.  + memory loss.  OBJ General- Alert, Oriented, Affect-appropriate, Distress- none acute, trim,  room air saturation 99% Skin- + postherpetic hyperpigmentation right flank Lymphadenopathy- none Head- atraumatic            Eyes- Gross vision intact, PERRLA, conjunctivae             Ears- Hearing, canals-normal            Nose- Clear, no-Septal dev, mucus, polyps, erosion, perforation             Throat- Mallampati II , mucosa clear , drainage- none, tonsils- atrophic Neck- flexible , trachea midline, no stridor , thyroid nl, carotid no bruit Chest - symmetrical excursion , unlabored           Heart/CV- RRR , no murmur , no gallop  , no rub, nl s1 s2                           -  JVD- none , edema- none, stasis changes- none, varices- none           Lung- + few crackles/unlabored, wheeze- none, cough+ light , dullness-none, rub- none           Chest wall- + mild tenderness to pressure along R anterior costal margin. No visible bruis, no crepitus.  Abd- Br/ Gen/ Rectal- Not done, not indicated Extrem- cyanosis- none, clubbing, none, atrophy- none, strength- nl Neuro- grossly intact to observation, grip seems normal

## 2020-07-21 ENCOUNTER — Ambulatory Visit: Payer: Medicare PPO | Admitting: Internal Medicine

## 2020-12-18 ENCOUNTER — Other Ambulatory Visit: Payer: Self-pay

## 2020-12-18 ENCOUNTER — Emergency Department (HOSPITAL_COMMUNITY)
Admission: EM | Admit: 2020-12-18 | Discharge: 2020-12-18 | Disposition: A | Discharge status: Home / Self Care | Payer: Medicare PPO | Attending: Emergency Medicine | Admitting: Emergency Medicine

## 2020-12-18 DIAGNOSIS — R1084 Generalized abdominal pain: Secondary | ICD-10-CM | POA: Insufficient documentation

## 2020-12-18 DIAGNOSIS — R109 Unspecified abdominal pain: Secondary | ICD-10-CM | POA: Diagnosis not present

## 2020-12-18 DIAGNOSIS — R300 Dysuria: Secondary | ICD-10-CM | POA: Insufficient documentation

## 2020-12-18 DIAGNOSIS — Z5321 Procedure and treatment not carried out due to patient leaving prior to being seen by health care provider: Secondary | ICD-10-CM | POA: Insufficient documentation

## 2020-12-18 LAB — COMPREHENSIVE METABOLIC PANEL
ALT: 8 U/L (ref 0–44)
AST: 16 U/L (ref 15–41)
Albumin: 3.6 g/dL (ref 3.5–5.0)
Alkaline Phosphatase: 62 U/L (ref 38–126)
Anion gap: 6 (ref 5–15)
BUN: 7 mg/dL — ABNORMAL LOW (ref 8–23)
CO2: 29 mmol/L (ref 22–32)
Calcium: 9.3 mg/dL (ref 8.9–10.3)
Chloride: 106 mmol/L (ref 98–111)
Creatinine, Ser: 0.78 mg/dL (ref 0.44–1.00)
GFR, Estimated: >60 mL/min (ref >60)
Glucose, Bld: 111 mg/dL — ABNORMAL HIGH (ref 70–99)
Potassium: 3.2 mmol/L — ABNORMAL LOW (ref 3.5–5.1)
Sodium: 141 mmol/L (ref 135–145)
Total Bilirubin: 0.9 mg/dL (ref 0.3–1.2)
Total Protein: 6.5 g/dL (ref 6.5–8.1)

## 2020-12-18 LAB — CBC
HCT: 35.7 % — ABNORMAL LOW (ref 36.0–46.0)
Hemoglobin: 11.3 g/dL — ABNORMAL LOW (ref 12.0–15.0)
MCH: 30.5 pg (ref 26.0–34.0)
MCHC: 31.7 g/dL (ref 30.0–36.0)
MCV: 96.5 fL (ref 80.0–100.0)
Platelets: 308 10*3/uL (ref 150–400)
RBC: 3.7 MIL/uL — ABNORMAL LOW (ref 3.87–5.11)
RDW: 11.9 % (ref 11.5–15.5)
WBC: 5.1 10*3/uL (ref 4.0–10.5)
nRBC: 0 % (ref 0.0–0.2)

## 2020-12-18 LAB — LIPASE, BLOOD: Lipase: 37 U/L (ref 11–51)

## 2020-12-18 NOTE — ED Triage Notes (Signed)
Pt. Continues to say "off and on"

## 2020-12-18 NOTE — ED Notes (Signed)
Pt seen being pushed outside in a wheelchair and getting in a car!

## 2020-12-18 NOTE — ED Triage Notes (Signed)
Pt. Stated, Patricia Davies had stomach pain all over off and on cant tell you when exactly cause I did not go to the DR.

## 2020-12-18 NOTE — ED Provider Notes (Signed)
Emergency Medicine Provider Triage Evaluation Note  Patricia Davies , a 75 y.o. female  was evaluated in triage.  Pt complains of abdominal pain, dysuria, generalized on and off painful sensation almost like itching for the last two days. Patient denies nausea, vomiting, diarrhea. Patient denies other medical problems, and has not tried anything for pain. Patient does have a history of cholecystectomy, abdominal hysterectomy.  Review of Systems  Positive: Abdominal pain, dysuria Negative: As above  Physical Exam  BP 127/76 (BP Location: Left Arm)   Pulse 78   Temp 98.6 F (37 C) (Oral)   Resp 18   SpO2 98%  Gen:   Awake, no distress   Resp:  Normal effort  MSK:   Moves extremities without difficulty  Other:  Generalized tenderness to palpation across entire abdomen, somewhat out of proportion to exam  Medical Decision Making  Medically screening exam initiated at 11:02 AM.  Appropriate orders placed.  Patricia Davies was informed that the remainder of the evaluation will be completed by another provider, this initial triage assessment does not replace that evaluation, and the importance of remaining in the ED until their evaluation is complete.  Abdominal pain   [place] 12/18/20 1104    Mancel Bale, MD 12/18/20 2042

## 2021-02-19 DIAGNOSIS — Z1159 Encounter for screening for other viral diseases: Secondary | ICD-10-CM | POA: Diagnosis not present

## 2021-02-19 DIAGNOSIS — E559 Vitamin D deficiency, unspecified: Secondary | ICD-10-CM | POA: Diagnosis not present

## 2021-02-19 DIAGNOSIS — Z0000 Encounter for general adult medical examination without abnormal findings: Secondary | ICD-10-CM | POA: Diagnosis not present

## 2021-02-19 DIAGNOSIS — R0602 Shortness of breath: Secondary | ICD-10-CM | POA: Diagnosis not present

## 2021-02-19 DIAGNOSIS — E7800 Pure hypercholesterolemia, unspecified: Secondary | ICD-10-CM | POA: Diagnosis not present

## 2021-02-19 DIAGNOSIS — R5383 Other fatigue: Secondary | ICD-10-CM | POA: Diagnosis not present

## 2021-02-19 DIAGNOSIS — R1084 Generalized abdominal pain: Secondary | ICD-10-CM | POA: Diagnosis not present

## 2021-02-22 DIAGNOSIS — R1084 Generalized abdominal pain: Secondary | ICD-10-CM | POA: Diagnosis not present

## 2021-03-01 DIAGNOSIS — J984 Other disorders of lung: Secondary | ICD-10-CM | POA: Diagnosis not present

## 2021-03-01 DIAGNOSIS — Z79899 Other long term (current) drug therapy: Secondary | ICD-10-CM | POA: Diagnosis not present

## 2021-03-01 DIAGNOSIS — J449 Chronic obstructive pulmonary disease, unspecified: Secondary | ICD-10-CM | POA: Diagnosis not present

## 2021-03-11 DIAGNOSIS — Z681 Body mass index (BMI) 19 or less, adult: Secondary | ICD-10-CM | POA: Diagnosis not present

## 2021-03-11 DIAGNOSIS — R2681 Unsteadiness on feet: Secondary | ICD-10-CM | POA: Diagnosis not present

## 2021-03-11 DIAGNOSIS — J449 Chronic obstructive pulmonary disease, unspecified: Secondary | ICD-10-CM | POA: Diagnosis not present

## 2021-03-11 DIAGNOSIS — E559 Vitamin D deficiency, unspecified: Secondary | ICD-10-CM | POA: Diagnosis not present

## 2021-04-11 DIAGNOSIS — R634 Abnormal weight loss: Secondary | ICD-10-CM | POA: Diagnosis not present

## 2021-04-11 DIAGNOSIS — Z1211 Encounter for screening for malignant neoplasm of colon: Secondary | ICD-10-CM | POA: Diagnosis not present

## 2021-04-19 DIAGNOSIS — R634 Abnormal weight loss: Secondary | ICD-10-CM | POA: Diagnosis not present

## 2021-04-19 DIAGNOSIS — Z1211 Encounter for screening for malignant neoplasm of colon: Secondary | ICD-10-CM | POA: Diagnosis not present

## 2021-04-25 DIAGNOSIS — K2270 Barrett's esophagus without dysplasia: Secondary | ICD-10-CM | POA: Diagnosis not present

## 2021-06-13 LAB — HM COLONOSCOPY

## 2021-06-21 ENCOUNTER — Encounter (HOSPITAL_COMMUNITY): Payer: Self-pay | Admitting: Pharmacy Technician

## 2021-06-21 ENCOUNTER — Emergency Department (HOSPITAL_COMMUNITY): Payer: Medicare PPO

## 2021-06-21 ENCOUNTER — Emergency Department (HOSPITAL_COMMUNITY)
Admission: EM | Admit: 2021-06-21 | Discharge: 2021-06-21 | Disposition: A | Discharge status: Home / Self Care | Payer: Medicare PPO | Attending: Emergency Medicine | Admitting: Emergency Medicine

## 2021-06-21 ENCOUNTER — Other Ambulatory Visit: Payer: Self-pay

## 2021-06-21 DIAGNOSIS — I10 Essential (primary) hypertension: Secondary | ICD-10-CM | POA: Insufficient documentation

## 2021-06-21 DIAGNOSIS — R531 Weakness: Secondary | ICD-10-CM

## 2021-06-21 DIAGNOSIS — N200 Calculus of kidney: Secondary | ICD-10-CM | POA: Diagnosis not present

## 2021-06-21 DIAGNOSIS — F0390 Unspecified dementia without behavioral disturbance: Secondary | ICD-10-CM | POA: Insufficient documentation

## 2021-06-21 DIAGNOSIS — E876 Hypokalemia: Secondary | ICD-10-CM

## 2021-06-21 DIAGNOSIS — R1110 Vomiting, unspecified: Secondary | ICD-10-CM | POA: Diagnosis not present

## 2021-06-21 DIAGNOSIS — J449 Chronic obstructive pulmonary disease, unspecified: Secondary | ICD-10-CM | POA: Insufficient documentation

## 2021-06-21 LAB — CBC
HCT: 34.3 % — ABNORMAL LOW (ref 36.0–46.0)
Hemoglobin: 11.3 g/dL — ABNORMAL LOW (ref 12.0–15.0)
MCH: 30.9 pg (ref 26.0–34.0)
MCHC: 32.9 g/dL (ref 30.0–36.0)
MCV: 93.7 fL (ref 80.0–100.0)
Platelets: 268 10*3/uL (ref 150–400)
RBC: 3.66 MIL/uL — ABNORMAL LOW (ref 3.87–5.11)
RDW: 12.1 % (ref 11.5–15.5)
WBC: 15.2 10*3/uL — ABNORMAL HIGH (ref 4.0–10.5)
nRBC: 0 % (ref 0.0–0.2)

## 2021-06-21 LAB — BASIC METABOLIC PANEL
Anion gap: 7 (ref 5–15)
BUN: 12 mg/dL (ref 8–23)
CO2: 27 mmol/L (ref 22–32)
Calcium: 9.1 mg/dL (ref 8.9–10.3)
Chloride: 105 mmol/L (ref 98–111)
Creatinine, Ser: 0.75 mg/dL (ref 0.44–1.00)
GFR, Estimated: >60 mL/min (ref >60)
Glucose, Bld: 112 mg/dL — ABNORMAL HIGH (ref 70–99)
Potassium: 3.2 mmol/L — ABNORMAL LOW (ref 3.5–5.1)
Sodium: 139 mmol/L (ref 135–145)

## 2021-06-21 LAB — URINALYSIS, ROUTINE W REFLEX MICROSCOPIC
Bilirubin Urine: NEGATIVE
Glucose, UA: NEGATIVE mg/dL
Ketones, ur: 20 mg/dL — AB
Leukocytes,Ua: NEGATIVE
Nitrite: NEGATIVE
Protein, ur: 100 mg/dL — AB
Specific Gravity, Urine: 1.024 (ref 1.005–1.030)
pH: 5 (ref 5.0–8.0)

## 2021-06-21 LAB — CBG MONITORING, ED: Glucose-Capillary: 105 mg/dL — ABNORMAL HIGH (ref 70–99)

## 2021-06-21 LAB — MAGNESIUM: Magnesium: 1.9 mg/dL (ref 1.7–2.4)

## 2021-06-21 MED ORDER — IOHEXOL 300 MG/ML  SOLN
100.0000 mL | Freq: Once | INTRAMUSCULAR | Status: AC | PRN
  Administered 2021-06-21: 100 mL via INTRAVENOUS

## 2021-06-21 MED ORDER — SODIUM CHLORIDE 0.9 % IV SOLN: INTRAVENOUS | Status: AC

## 2021-06-21 MED ORDER — POTASSIUM CHLORIDE CRYS ER 20 MEQ PO TBCR
40.0000 meq | EXTENDED_RELEASE_TABLET | Freq: Once | ORAL | Status: AC
  Administered 2021-06-21: 40 meq via ORAL
  Filled 2021-06-21: qty 2

## 2021-06-21 NOTE — ED Notes (Signed)
Walked patient to the bathroom patient did well 

## 2021-06-21 NOTE — ED Notes (Signed)
Pt's family came to nurses station verbally aggressive with this staff member regarding saying his mother was up for discharge. Explained to pt's family member that the pt is not up for discharge and that I would have to clarify with the provider. Pt's family member verbally aggressively was saying things as he walked back to room and can be heard from nurses station talking in raised voice. Pt's family member now standing in doorway of pt room and staring at the nurses station with arms crossed.  ?

## 2021-06-21 NOTE — ED Provider Notes (Signed)
Care assumed from previous provider, see note for full HPI. ? ?In summation 76 year old history of dementia, gait abnormality (chronic per family) here for evaluation of generalized weakness.  Occurred yesterday, improved today.  Sounds like she has had some gen abd tenderness on exam today. She had a nonfocal neuro exam with previous provider. No focal deficits with weakness. Ambulatory had ataxic gait. No urinary complaints.  Poor historian at baseline. ? ?Plan on follow-up on CT head, CT abdomen and pelvis. ? ?If neg dc home. Previous provider Did not feel needed MR brain, US abd needed ? ?Physical Exam  ?BP 120/69   Pulse 76   Temp 99 ?F (37.2 ?C) (Oral)   Resp 18   SpO2 95%  ? ?Physical Exam ?Vitals and nursing note reviewed.  ?Constitutional:   ?   General: She is not in acute distress. ?   Appearance: She is well-developed. She is not ill-appearing, toxic-appearing or diaphoretic.  ?HENT:  ?   Head: Atraumatic.  ?Eyes:  ?   Pupils: Pupils are equal, round, and reactive to light.  ?Cardiovascular:  ?   Rate and Rhythm: Normal rate.  ?Pulmonary:  ?   Effort: No respiratory distress.  ?Abdominal:  ?   General: Bowel sounds are normal. There is no distension.  ?   Palpations: Abdomen is soft.  ?   Tenderness: There is no abdominal tenderness. There is no guarding or rebound. Negative signs include Murphy's sign and McBurney's sign.  ?   Comments: Soft, nontender  ?Musculoskeletal:     ?   General: Normal range of motion.  ?   Cervical back: Normal range of motion.  ?Skin: ?   General: Skin is warm and dry.  ?Neurological:  ?   General: No focal deficit present.  ?   Mental Status: She is alert and oriented to person, place, and time.  ?   Comments: Cranial nerves II through XII grossly intact ?Ambulatory  ?Equal grip ?Intact sensation  ?Psychiatric:     ?   Mood and Affect: Mood normal.  ? ? ?Procedures  ?Procedures ?Labs Reviewed  ?BASIC METABOLIC PANEL - Abnormal; Notable for the following components:  ?     Result Value  ? Potassium 3.2 (*)   ? Glucose, Bld 112 (*)   ? All other components within normal limits  ?CBC - Abnormal; Notable for the following components:  ? WBC 15.2 (*)   ? RBC 3.66 (*)   ? Hemoglobin 11.3 (*)   ? HCT 34.3 (*)   ? All other components within normal limits  ?URINALYSIS, ROUTINE W REFLEX MICROSCOPIC - Abnormal; Notable for the following components:  ? Color, Urine AMBER (*)   ? APPearance HAZY (*)   ? Hgb urine dipstick MODERATE (*)   ? Ketones, ur 20 (*)   ? Protein, ur 100 (*)   ? Bacteria, UA RARE (*)   ? All other components within normal limits  ?CBG MONITORING, ED - Abnormal; Notable for the following components:  ? Glucose-Capillary 105 (*)   ? All other components within normal limits  ?URINE CULTURE  ?MAGNESIUM  ? CT Head Wo Contrast ? ?Result Date: 06/21/2021 ?CLINICAL DATA:  Lower extremity weakness. EXAM: CT HEAD WITHOUT CONTRAST TECHNIQUE: Contiguous axial images were obtained from the base of the skull through the vertex without intravenous contrast. RADIATION DOSE REDUCTION: This exam was performed according to the departmental dose-optimization program which includes automated exposure control, adjustment of the mA and/or kV according  to patient size and/or use of iterative reconstruction technique. COMPARISON:  Patricia Davies. FINDINGS: Brain: There is mild to moderate severity cerebral atrophy with widening of the extra-axial spaces and ventricular dilatation. There are areas of decreased attenuation within the white matter tracts of the supratentorial brain, consistent with microvascular disease changes. Vascular: There is moderate to marked severity calcification of the bilateral cavernous carotid arteries. Skull: Normal. Negative for fracture or focal lesion. Sinuses/Orbits: No acute finding. Other: Patricia Davies. IMPRESSION: 1. No acute intracranial abnormality. 2. Mild to moderate severity cerebral atrophy and microvascular disease changes of the supratentorial brain. Electronically Signed    By: Aram Candela M.D.   On: 06/21/2021 16:19  ? ?CT ABDOMEN PELVIS W CONTRAST ? ?Result Date: 06/21/2021 ?CLINICAL DATA:  Abdominal pain, acute, nonlocalized EXAM: CT ABDOMEN AND PELVIS WITH CONTRAST TECHNIQUE: Multidetector CT imaging of the abdomen and pelvis was performed using the standard protocol following bolus administration of intravenous contrast. RADIATION DOSE REDUCTION: This exam was performed according to the departmental dose-optimization program which includes automated exposure control, adjustment of the mA and/or kV according to patient size and/or use of iterative reconstruction technique. CONTRAST:  OMNIPAQUE IOHEXOL 300 MG/ML  SOLN COMPARISON:  CT chest December 07, 2010. FINDINGS: Lower chest: Partially imaged cystic bronchiectasis and scarring in the right greater than left lower lobes. Hepatobiliary: No focal liver abnormality is seen. No gallstones, gallbladder wall thickening, or biliary dilatation. Gallbladder is decompressed. Pancreas: Unremarkable. No pancreatic ductal dilatation or surrounding inflammatory changes. Spleen: Normal in size without focal abnormality. Adrenals/Urinary Tract: Unremarkable adrenal glands. No hydronephrosis. Subcentimeter right renal hypodense lesions, too small to characterize. Punctate nonobstructing left renal calculus. The ureters are difficult to follow but there is no evidence of a calculus along the expected course of the ureters. Stomach/Bowel: Paucity of intra-abdominal fat limits assessment. No dilated bowel loops to suggest obstruction. No evidence of bowel wall thickening or inflammatory change. Vascular/Lymphatic: Aortic atherosclerosis. No enlarged abdominal or pelvic lymph nodes. Reproductive: Uterus and bilateral adnexa are unremarkable. Other: No abdominal wall hernia or abnormality. No abdominopelvic ascites. Musculoskeletal: Facet mediated/degenerative anterolisthesis of L4 on L5 with severe facet arthropathy. Associated borderline  grade 1/2 anterolisthesis of L4 on L5. IMPRESSION: 1. No evidence of acute intra-abdominal abnormality. 2. Partially imaged cystic bronchiectasis and scarring in the right greater than left lower lobes. Findings better characterized on prior CT of the chest from December 17, 2010. 3. Severe L4-L5 facet arthropathy with associated borderline grade 1/2 anterolisthesis. Electronically Signed   By: Feliberto Harts M.D.   On: 06/21/2021 16:25    ?ED Course / MDM  ? ?Clinical Course as of 06/21/21 1837  ?Thu Jun 21, 2021  ?1512 Weakness yesterday, better today. Gait issues at baseline, dementia. Ambulatory here without issues. Emesis yesterday, abd tenderness. No falls. Non focal neuro exam. FU on CT head/abd and dispo. [BH]  ?  ?Clinical Course User Index ?[BH] Areal Cochrane A, PA-C  ? ? ?Care assumed from previous provider at shift change.  See note for full HPI.  In summation 76 year old history of dementia, chronic gait abnormality here for evaluation of weakness yesterday, improved today.  Had a nonfocal neuro exam without deficits.  At baseline mentation per family.  She is ambulatory here.  Equal strength, intact sensation, no facial droop.  Plan on follow-up on CT head, CT abdomen given diffuse abdominal tenderness white count 15 ? ?Labs and imaging personally viewed and interpreted: ? ?CT abdomen and pelvis with bronchiectasis consistent with prior CT scans,  no acute pathology ?CT head with atrophy, chronic changes, no acute CVA ?EKG without ischemic changes ?UA negative for infection ?Mag 1.9 ?CBC leukocytosis 15.2, hemoglobin 11.3 similar to prior ?Metabolic panel potassium 3.2 ? ?Discussed labs and imaging with patient and family.  Tolerating p.o., nonfocal neuro exam, low suspicion for CVA, dissection. Will have follow-up outpatient with PCP, return for new or worsening symptoms.  Will place neuro consult outpatient given patient's Dementia sx and family rec meds for dementia. No behavioral issues here  currently. ?Baseline Mentation. ? ?The patient has been appropriately medically screened and/or stabilized in the ED. I have low suspicion for any other emergent medical condition which would require furt

## 2021-06-21 NOTE — Discharge Instructions (Addendum)
I have placed a  referral to neurology. ? ?Potassium was mildly low.  We have given a tablet for this. ? ?May try protein drinks, ensure to get her to eat and drink. ? ?May follow-up with PCP in the meantime. ? ?Return for new or worsening symptoms. ?

## 2021-06-21 NOTE — ED Provider Notes (Signed)
?[facility] ?Provider Note ? ? ?CSN: [id number] ?Arrival date & time: 06/21/21  1008 ? ?  ? ?History ? ?No chief complaint on file. ? ? ?Patricia Davies is a 76 y.o. female. ? ?HPI ? ?76 year old female presents emergency department with daughter at bedside complaining of increasing lower extremity weakness of mother as well as vomiting.  Patient is not the greatest historian.  She has baseline dementia as per daughter and is reliant upon daughter for history.  Patient lives with daughter, and daughter states that her mother barely lets her care for the mother.  Daughter states that weakness was noted when she asked her mother to get up to go to the bathroom last night around 7 PM.  She states that the patient's ambulation earlier that morning when she left for work around 6:30 AM was at her baseline normal.  She then came home around 7 PM noticing more weakness in the patient's ambulatory status; she seemed overall more "unstable".  Patient's daughter and patient deny altered level of consciousness, syncopal episodes, falls, shortness of breath, chest pain, nausea, diarrhea, abdominal pain, fever, chills, night sweats, loss of bowel/bladder dysfunction.  Patient is not on anticoagulation. ? ?Patient has a past medical history significant for Hypertension, COPD, vitamin B12 deficiency, sarcoidosis, and dementia as per patient's family ? ?Only medicines include occasional Tylenol and ibuprofen ? ?Home Medications ?Prior to Admission medications   ?Medication Sig Start Date End Date Taking? Authorizing Provider  ?acetaminophen (TYLENOL) 325 MG tablet Take 650 mg by mouth every 6 (six) hours as needed.    [provider]  ?   ? ?Allergies    ?Patient has no known allergies.   ? ?Review of Systems   ?Review of Systems  ?Constitutional:  Negative for chills, fatigue and fever.  ?HENT:  Negative for ear pain, rhinorrhea, sinus pain and sore throat.   ?Eyes:  Negative for pain  and visual disturbance.  ?Respiratory:  Negative for cough, shortness of breath and wheezing.   ?Cardiovascular:  Negative for chest pain, palpitations and leg swelling.  ?Gastrointestinal:  Positive for vomiting. Negative for abdominal pain, constipation, diarrhea and nausea.  ?Genitourinary:  Negative for decreased urine volume, dysuria, frequency and urgency.  ?Skin:  Negative for pallor, rash and wound.  ?Neurological:  Positive for weakness. Negative for seizures, syncope, facial asymmetry, speech difficulty, light-headedness, numbness and headaches.  ?Hematological:  Does not bruise/bleed easily.  ?Psychiatric/Behavioral:    ?     Patient denied every review of system question asked.  Daughter confirmed the symptoms for the patient.  Patient has baseline cognitive impairment.    ? ?Physical Exam ?Updated Vital Signs ?BP 105/65 (BP Location: Left Arm)   Pulse 90   Temp 99 ?F (37.2 ?C) (Oral)   Resp (!) 22   SpO2 98%  ?Physical Exam ?Vitals and nursing note reviewed.  ?Constitutional:   ?   General: She is not in acute distress. ?   Appearance: Normal appearance. She is not ill-appearing, toxic-appearing or diaphoretic.  ?HENT:  ?   Head: Normocephalic.  ?   Nose: Nose normal.  ?   Mouth/Throat:  ?   Mouth: Mucous membranes are moist.  ?   Pharynx: Oropharynx is clear.  ?Eyes:  ?   General: Visual field deficit present.  ?   Extraocular Movements: Extraocular movements intact.  ?   Conjunctiva/sclera: Conjunctivae normal.  ?   Pupils: Pupils are equal, round, and reactive to light.  ?  Comments: Visual field deficit in all but right lower quadrant of the periphery.  Unsure of visual baseline the patient.  ?Cardiovascular:  ?   Rate and Rhythm: Normal rate and regular rhythm.  ?   Pulses: Normal pulses.  ?   Heart sounds: Normal heart sounds.  ?Pulmonary:  ?   Effort: Pulmonary effort is normal.  ?   Breath sounds: Normal breath sounds.  ?Abdominal:  ?   General: Abdomen is flat.  ?   Palpations: Abdomen is  soft.  ?Musculoskeletal:  ?   Comments: Patient winces in apparent pain with slightest of touch on musculoskeletal exam as well as abdominal exam.  Difficult to ascertain level of pain the patient is in as well as the source of pain. ? ?Muscle strength significant for weakness in upper and lower extremities; strength is not differ between extremities.  Muscle strength noted for elbow flexion static/extension and grip strength of upper extremities.  Muscle strength noted for knee flexion and extension as well as foot dorsi and plantar flexion.  Radial, posterior tibial pulses full and equal bilaterally.  There is no decrease in sensation along major nerve roots of upper and lower extremities.  ?Skin: ?   General: Skin is warm and dry.  ?   Capillary Refill: Capillary refill takes less than 2 seconds.  ?   Findings: No bruising, lesion or rash.  ?Neurological:  ?   Mental Status: She is alert and oriented to person, place, and time. Mental status is at baseline.  ?   Sensory: Sensation is intact.  ?   Motor: Motor function is intact. No pronator drift.  ?   Coordination: Romberg sign negative. Finger-Nose-Finger Test normal.  ?   Gait: Gait abnormal.  ?   Comments: Patient has slow, shuffling gait with noticeable instability.  Daughter says that this has gotten progressively worse but is not far off baseline.  Daughter and patient attributes this to weakness. ? ?Cranial nerves III through XII grossly intact with no observable abnormalities.  She did have visual field deficits and all but right lower periphery.  I am unaware of patient's visual baseline. ? ?Patient unable to perform heel-to-shin.  Patient able to ambulate without assistance but gait is shuffled in nature and very slow.  Seems unstable with movement.  As per daughter, instability is patient's baseline but current presentation is worse.  ?Psychiatric:     ?   Behavior: Behavior is cooperative.  ? ? ?ED Results / Procedures / Treatments   ?Labs ?(all labs  ordered are listed, but only abnormal results are displayed) ?Labs Reviewed  ?CBC - Abnormal; Notable for the following components:  ?    Result Value  ? WBC 15.2 (*)   ? RBC 3.66 (*)   ? Hemoglobin 11.3 (*)   ? HCT 34.3 (*)   ? All other components within normal limits  ?CBG MONITORING, ED - Abnormal; Notable for the following components:  ? Glucose-Capillary 105 (*)   ? All other components within normal limits  ?BASIC METABOLIC PANEL  ?URINALYSIS, ROUTINE W REFLEX MICROSCOPIC  ? ? ?EKG ?EKG Interpretation ? ?Date/Time:  Thursday June 21 2021 10:28:38 EDT ?Ventricular Rate:  94 ?PR Interval:  128 ?QRS Duration: 68 ?QT Interval:  356 ?QTC Calculation: 445 ?R Axis:   60 ?Text Interpretation: Normal sinus rhythm Nonspecific ST abnormality Abnormal ECG When compared with ECG of 15-Dec-2010 22:20, PREVIOUS ECG IS PRESENT Confirmed by Eber Hong ([id number]) on 06/21/2021 10:50:02 AM ? ?Radiology ?  No results found. ? ?Procedures ?Procedures  ? ? ?Medications Ordered in ED ?Medications - No data to display ? ?ED Course/ Medical Decision Making/ A&P ?Clinical Course as of 06/21/21 1520  ?Thu Jun 21, 2021  ?1512 Weakness yesterday, better today. Gait issues at baseline, dementia. Ambulatory here without issues. Emesis yesterday, abd tenderness. No falls. Non focal neuro exam. FU on CT head/abd and dispo. [BH]  ?  ?Clinical Course User Index ?[BH] Henderly, Britni A, PA-C  ? ?                        ?Medical Decision Making ?Amount and/or Complexity of Data Reviewed ?Labs: ordered. ? ? ?This patient presents to the ED for concern of weakness, this involves an extensive number of treatment options, and is a complaint that carries with it a high risk of complications and morbidity.  The differential diagnosis includes hemorrhagic/ischemic stroke, Katheran Awe? syndrome, autoimmune disorder, spinal cord lesion, electrolyte imbalance, cauda equina syndrome ? ?Co morbidities that complicate the patient evaluation ? ?Baseline  cognitive impairment ?Medical noncompliance ?Completely dependent ? ? ?Additional history obtained: ? ?Additional history obtained from imaging from 01/30/2011 external records from outside source obtained and

## 2021-06-21 NOTE — ED Notes (Signed)
Reviewed discharge instructions with patient and family. Follow-up care reviewed. Patient and family verbalized understanding. Patient A&Ox4, VSS upon discharge. Pt's family apologetic upon departure. ?

## 2021-06-21 NOTE — ED Notes (Addendum)
Family came to nurses station verbally aggressive and walking towards staff aggressively. Security at [employer].  ?

## 2021-06-21 NOTE — ED Triage Notes (Signed)
Pt bib family with reports of bil lower extremity weakness that family noticed last night. Family reports weakness is a little better today. Family also reports pt vomited last night. Pt with dementia and a poor historian. Pt moving all extremities in triage.  ?

## 2021-06-22 LAB — URINE CULTURE: Culture: NO GROWTH

## 2021-08-22 ENCOUNTER — Encounter: Payer: Self-pay | Admitting: Internal Medicine

## 2021-08-22 ENCOUNTER — Ambulatory Visit: Payer: Medicare PPO | Admitting: Internal Medicine

## 2021-08-22 VITALS — BP 142/78 | HR 65 | Temp 97.8°F | Resp 16 | Ht 62.0 in | Wt 92.0 lb

## 2021-08-22 DIAGNOSIS — E785 Hyperlipidemia, unspecified: Secondary | ICD-10-CM | POA: Diagnosis not present

## 2021-08-22 DIAGNOSIS — F03918 Unspecified dementia, unspecified severity, with other behavioral disturbance: Secondary | ICD-10-CM | POA: Diagnosis not present

## 2021-08-22 DIAGNOSIS — I10 Essential (primary) hypertension: Secondary | ICD-10-CM

## 2021-08-22 DIAGNOSIS — E538 Deficiency of other specified B group vitamins: Secondary | ICD-10-CM

## 2021-08-22 DIAGNOSIS — G63 Polyneuropathy in diseases classified elsewhere: Secondary | ICD-10-CM

## 2021-08-22 DIAGNOSIS — Z0000 Encounter for general adult medical examination without abnormal findings: Secondary | ICD-10-CM

## 2021-08-22 DIAGNOSIS — E440 Moderate protein-calorie malnutrition: Secondary | ICD-10-CM | POA: Diagnosis not present

## 2021-08-22 DIAGNOSIS — D520 Dietary folate deficiency anemia: Secondary | ICD-10-CM

## 2021-08-22 DIAGNOSIS — E876 Hypokalemia: Secondary | ICD-10-CM

## 2021-08-22 DIAGNOSIS — Z23 Encounter for immunization: Secondary | ICD-10-CM

## 2021-08-22 LAB — BASIC METABOLIC PANEL
BUN: 12 mg/dL (ref 6–23)
CO2: 33 mEq/L — ABNORMAL HIGH (ref 19–32)
Calcium: 10 mg/dL (ref 8.4–10.5)
Chloride: 103 mEq/L (ref 96–112)
Creatinine, Ser: 0.74 mg/dL (ref 0.40–1.20)
GFR: 79.84 mL/min (ref >60.00)
Glucose, Bld: 88 mg/dL (ref 70–99)
Potassium: 3.8 mEq/L (ref 3.5–5.1)
Sodium: 141 mEq/L (ref 135–145)

## 2021-08-22 LAB — CBC WITH DIFFERENTIAL/PLATELET
Basophils Absolute: 0 K/uL (ref 0.0–0.1)
Basophils Relative: 0.3 % (ref 0.0–3.0)
Eosinophils Absolute: 0.2 K/uL (ref 0.0–0.7)
Eosinophils Relative: 3.3 % (ref 0.0–5.0)
HCT: 36.7 % (ref 36.0–46.0)
Hemoglobin: 12.1 g/dL (ref 12.0–15.0)
Lymphocytes Relative: 45 % (ref 12.0–46.0)
Lymphs Abs: 2.4 K/uL (ref 0.7–4.0)
MCHC: 33 g/dL (ref 30.0–36.0)
MCV: 93 fl (ref 78.0–100.0)
Monocytes Absolute: 0.6 K/uL (ref 0.1–1.0)
Monocytes Relative: 10.5 % (ref 3.0–12.0)
Neutro Abs: 2.1 K/uL (ref 1.4–7.7)
Neutrophils Relative %: 40.9 % — ABNORMAL LOW (ref 43.0–77.0)
Platelets: 250 K/uL (ref 150.0–400.0)
RBC: 3.94 Mil/uL (ref 3.87–5.11)
RDW: 13.8 % (ref 11.5–15.5)
WBC: 5.2 K/uL (ref 4.0–10.5)

## 2021-08-22 LAB — FOLATE: Folate: 6.5 ng/mL (ref >5.9)

## 2021-08-22 LAB — LIPID PANEL
Cholesterol: 207 mg/dL — ABNORMAL HIGH (ref 0–200)
HDL: 75 mg/dL
LDL Cholesterol: 109 mg/dL — ABNORMAL HIGH (ref 0–99)
NonHDL: 132.44
Total CHOL/HDL Ratio: 3
Triglycerides: 117 mg/dL (ref 0.0–149.0)
VLDL: 23.4 mg/dL (ref 0.0–40.0)

## 2021-08-22 LAB — TSH: TSH: 1.33 u[IU]/mL (ref 0.35–5.50)

## 2021-08-22 LAB — VITAMIN B12: Vitamin B-12: 390 pg/mL (ref 211–911)

## 2021-08-22 LAB — MAGNESIUM: Magnesium: 2.1 mg/dL (ref 1.5–2.5)

## 2021-08-22 NOTE — Progress Notes (Unsigned)
Subjective:  Patient ID: Patricia Davies, female    DOB: 1946-01-01  Age: 76 y.o. MRN: [id number]  CC: Annual Exam, Anemia, Hypertension, and Hyperlipidemia   HPI Patricia Davies presents for ***  Outpatient Medications Prior to Visit  Medication Sig Dispense Refill   acetaminophen (TYLENOL) 325 MG tablet Take 650 mg by mouth every 6 (six) hours as needed.     ibuprofen (ADVIL) 200 MG tablet Take 400 mg by mouth every 6 (six) hours as needed for headache or mild pain.     No facility-administered medications prior to visit.    ROS Review of Systems  Objective:  BP (!) 142/78 (BP Location: Left Arm, Patient Position: Sitting, Cuff Size: Large)   Pulse 65   Temp 97.8 F (36.6 C) (Oral)   Resp 16   Ht 5\' 2"  (1.575 m)   Wt 92 lb (41.7 kg)   SpO2 97%   BMI 16.83 kg/m   BP Readings from Last 3 Encounters:  08/22/21 (!) 142/78  06/21/21 120/72  12/18/20 127/76    Wt Readings from Last 3 Encounters:  08/22/21 92 lb (41.7 kg)  07/22/19 109 lb (49.4 kg)  11/11/17 94 lb 8 oz (42.9 kg)    Physical Exam  Lab Results  Component Value Date   WBC 15.2 (H) 06/21/2021   HGB 11.3 (L) 06/21/2021   HCT 34.3 (L) 06/21/2021   PLT 268 06/21/2021   GLUCOSE 112 (H) 06/21/2021   CHOL 158 11/11/2017   TRIG 80.0 11/11/2017   HDL 66.70 11/11/2017   LDLCALC 76 11/11/2017   ALT 8 12/18/2020   AST 16 12/18/2020   NA 139 06/21/2021   K 3.2 (L) 06/21/2021   CL 105 06/21/2021   CREATININE 0.75 06/21/2021   BUN 12 06/21/2021   CO2 27 06/21/2021   TSH 0.90 08/07/2016   INR 1.0 09/15/2014   HGBA1C 4.9 06/01/2015    CT ABDOMEN PELVIS W CONTRAST  Result Date: 06/21/2021 CLINICAL DATA:  Abdominal pain, acute, nonlocalized EXAM: CT ABDOMEN AND PELVIS WITH CONTRAST TECHNIQUE: Multidetector CT imaging of the abdomen and pelvis was performed using the standard protocol following bolus administration of intravenous contrast. RADIATION DOSE REDUCTION: This exam was performed according to the  departmental dose-optimization program which includes automated exposure control, adjustment of the mA and/or kV according to patient size and/or use of iterative reconstruction technique. CONTRAST:  06/23/2021 OMNIPAQUE IOHEXOL 300 MG/ML  SOLN COMPARISON:  CT chest December 07, 2010. FINDINGS: Lower chest: Partially imaged cystic bronchiectasis and scarring in the right greater than left lower lobes. Hepatobiliary: No focal liver abnormality is seen. No gallstones, gallbladder wall thickening, or biliary dilatation. Gallbladder is decompressed. Pancreas: Unremarkable. No pancreatic ductal dilatation or surrounding inflammatory changes. Spleen: Normal in size without focal abnormality. Adrenals/Urinary Tract: Unremarkable adrenal glands. No hydronephrosis. Subcentimeter right renal hypodense lesions, too small to characterize. Punctate nonobstructing left renal calculus. The ureters are difficult to follow but there is no evidence of a calculus along the expected course of the ureters. Stomach/Bowel: Paucity of intra-abdominal fat limits assessment. No dilated bowel loops to suggest obstruction. No evidence of bowel wall thickening or inflammatory change. Vascular/Lymphatic: Aortic atherosclerosis. No enlarged abdominal or pelvic lymph nodes. Reproductive: Uterus and bilateral adnexa are unremarkable. Other: No abdominal wall hernia or abnormality. No abdominopelvic ascites. Musculoskeletal: Facet mediated/degenerative anterolisthesis of L4 on L5 with severe facet arthropathy. Associated borderline grade 1/2 anterolisthesis of L4 on L5. IMPRESSION: 1. No evidence of acute intra-abdominal abnormality. 2. Partially  imaged cystic bronchiectasis and scarring in the right greater than left lower lobes. Findings better characterized on prior CT of the chest from December 17, 2010. 3. Severe L4-L5 facet arthropathy with associated borderline grade 1/2 anterolisthesis. Electronically Signed   By: Feliberto Harts M.D.   On:  06/21/2021 16:25   CT Head Wo Contrast  Result Date: 06/21/2021 CLINICAL DATA:  Lower extremity weakness. EXAM: CT HEAD WITHOUT CONTRAST TECHNIQUE: Contiguous axial images were obtained from the base of the skull through the vertex without intravenous contrast. RADIATION DOSE REDUCTION: This exam was performed according to the departmental dose-optimization program which includes automated exposure control, adjustment of the mA and/or kV according to patient size and/or use of iterative reconstruction technique. COMPARISON:  None. FINDINGS: Brain: There is mild to moderate severity cerebral atrophy with widening of the extra-axial spaces and ventricular dilatation. There are areas of decreased attenuation within the white matter tracts of the supratentorial brain, consistent with microvascular disease changes. Vascular: There is moderate to marked severity calcification of the bilateral cavernous carotid arteries. Skull: Normal. Negative for fracture or focal lesion. Sinuses/Orbits: No acute finding. Other: None. IMPRESSION: 1. No acute intracranial abnormality. 2. Mild to moderate severity cerebral atrophy and microvascular disease changes of the supratentorial brain. Electronically Signed   By: Aram Candela M.D.   On: 06/21/2021 16:19    Assessment & Plan:   Patricia Davies was seen today for annual exam, anemia, hypertension and hyperlipidemia.  Diagnoses and all orders for this visit:  Essential hypertension -     Magnesium; Future -     Basic metabolic panel; Future -     Aldosterone + renin activity w/ ratio; Future -     TSH; Future  Vitamin B12 deficiency neuropathy (HCC) -     Vitamin B12; Future -     CBC with Differential/Platelet; Future -     Folate; Future  Hyperlipidemia LDL goal <130 -     Lipid panel; Future -     TSH; Future  Routine general medical examination at a health care facility  Protein-calorie malnutrition, moderate (HCC) -     Ambulatory referral to Hospice -      Vitamin B1; Future -     Zinc; Future  Chronic hypokalemia -     Magnesium; Future -     Basic metabolic panel; Future -     Aldosterone + renin activity w/ ratio; Future  Dietary folate deficiency anemia  Dementia with behavioral disturbance (HCC) -     Ambulatory referral to Hospice -     Ambulatory referral to Neurology -     Vitamin B1; Future -     Zinc; Future   I am having Patricia Davies maintain her acetaminophen and ibuprofen.  No orders of the defined types were placed in this encounter.    Follow-up: No follow-ups on file.  Sanda Linger, MD

## 2021-08-22 NOTE — Patient Instructions (Signed)

## 2021-08-23 ENCOUNTER — Telehealth: Payer: Self-pay | Admitting: Internal Medicine

## 2021-08-23 NOTE — Telephone Encounter (Signed)
Patricia Davies with [facility] calls today regarding PT's recent referral to hospice care. Patricia Davies had spoke with PT's daughter Patricia Davies and was told to cancel this referral. She was not under the impression that her mother's health was at a state that she would need hospice brought in. She would like to speak to Dr.Jones or a CMA first before proceeding.   CB: [phone]

## 2021-08-23 NOTE — Telephone Encounter (Signed)
I have spoke to [facility] and informed her of the referral process and stated an evaluation would be done first to proceed with what type of care the pt would qualify for, hospice or palliative care. She expressed understanding and was on board to proceed.

## 2021-08-24 ENCOUNTER — Telehealth: Payer: Self-pay | Admitting: Hospice

## 2021-08-24 NOTE — Telephone Encounter (Signed)
Spoke with patient's daughter Beanna Orendorff, regarding the Palliative referral/services and verbal consent was obtained to begin services.  I have scheduled a Zoom Palliative Consult for 08/27/21 @ 1 PM

## 2021-08-26 MED ORDER — BOOSTRIX 5-2.5-18.5 LF-MCG/0.5 IM SUSP: 0.5000 mL | Freq: Once | INTRAMUSCULAR | 0 refills | Status: AC

## 2021-08-26 MED ORDER — SHINGRIX 50 MCG/0.5ML IM SUSR: 0.5000 mL | Freq: Once | INTRAMUSCULAR | 1 refills | Status: AC

## 2021-08-27 ENCOUNTER — Other Ambulatory Visit: Payer: Medicare PPO | Admitting: Hospice

## 2021-08-27 DIAGNOSIS — F0390 Unspecified dementia without behavioral disturbance: Secondary | ICD-10-CM | POA: Diagnosis not present

## 2021-08-27 DIAGNOSIS — E43 Unspecified severe protein-calorie malnutrition: Secondary | ICD-10-CM | POA: Diagnosis not present

## 2021-08-27 DIAGNOSIS — Z515 Encounter for palliative care: Secondary | ICD-10-CM

## 2021-08-29 ENCOUNTER — Encounter: Payer: Self-pay | Admitting: Physician Assistant

## 2021-09-01 LAB — ZINC: Zinc: 62 ug/dL (ref 60–130)

## 2021-09-01 LAB — VITAMIN B1: Vitamin B1 (Thiamine): 22 nmol/L (ref 8–30)

## 2021-09-01 LAB — ALDOSTERONE + RENIN ACTIVITY W/ RATIO
ALDO / PRA Ratio: 15.6 Ratio (ref 0.9–28.9)
Aldosterone: 5 ng/dL
Renin Activity: 0.32 ng/mL/h (ref 0.25–5.82)

## 2021-09-07 ENCOUNTER — Ambulatory Visit: Payer: Medicare PPO | Admitting: Physician Assistant

## 2021-09-07 ENCOUNTER — Encounter: Payer: Self-pay | Admitting: Physician Assistant

## 2021-09-07 VITALS — BP 116/84 | HR 61 | Resp 18 | Ht 61.0 in | Wt 87.0 lb

## 2021-09-07 DIAGNOSIS — G309 Alzheimer's disease, unspecified: Secondary | ICD-10-CM

## 2021-09-07 DIAGNOSIS — F0280 Dementia in other diseases classified elsewhere without behavioral disturbance: Secondary | ICD-10-CM | POA: Diagnosis not present

## 2021-09-07 DIAGNOSIS — F03918 Unspecified dementia, unspecified severity, with other behavioral disturbance: Secondary | ICD-10-CM

## 2021-09-07 MED ORDER — MEMANTINE HCL 10 MG PO TABS: ORAL_TABLET | ORAL | 11 refills | Status: AC
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
## 2021-09-07 NOTE — Patient Instructions (Addendum)
It was a pleasure to see you today at our office.   Recommendations:  Continue mood control as per PCP and Palliative Care team Folllow up in 6 months  Start memantine 10 mg, take 1 tab nightly for 2 weeks then increase to 1 tabtwice a day  Continue B12 and B1  Please set a phone call with Francena Hanly, Social Worker, to discuss dementia counseling, including caregiver distress, community resources, etc    Whom to call:  Memory  decline, memory medications: Call our office [phone]   For psychiatric meds, mood meds: Please have your primary care physician manage and palliative care  these medications.   Counseling regarding caregiver distress, including caregiver depression, anxiety and issues regarding community resources, adult day care programs, adult living facilities, or memory care questions:   Feel free to contact Misty Lisabeth Register, Social Worker at [phone]   For assessment of decision of mental capacity and competency:  Call Dr. Erick Blinks, geriatric psychiatrist at [phone]  For guidance in geriatric dementia issues please call [facility] [phone]  For guidance regarding [facility] and if placement were needed at the facility, contact Sidney Ace, Social Worker tel: [phone]  If you have any severe symptoms of a stroke, or other severe issues such as confusion,severe chills or fever, etc call 911 or go to the ER as you may need to be evaluated further   Feel free to visit Facebook page " [email or web address]" for tips of how to care for people with memory problems.      RECOMMENDATIONS FOR ALL PATIENTS WITH MEMORY PROBLEMS: 1. Continue to exercise (Recommend 30 minutes of walking everyday, or 3 hours every week) 2. Increase social interactions - continue going to [place] and enjoy social gatherings with friends and family 3. Eat healthy, avoid fried foods and eat more fruits and vegetables 4. Maintain adequate  blood pressure, blood sugar, and blood cholesterol level. Reducing the risk of stroke and cardiovascular disease also helps promoting better memory. 5. Avoid stressful situations. Live a simple life and avoid aggravations. Organize your time and prepare for the next day in anticipation. 6. Sleep well, avoid any interruptions of sleep and avoid any distractions in the bedroom that may interfere with adequate sleep quality 7. Avoid sugar, avoid sweets as there is a strong link between excessive sugar intake, diabetes, and cognitive impairment We discussed the Mediterranean diet, which has been shown to help patients reduce the risk of progressive memory disorders and reduces cardiovascular risk. This includes eating fish, eat fruits and green leafy vegetables, nuts like almonds and hazelnuts, walnuts, and also use olive oil. Avoid fast foods and fried foods as much as possible. Avoid sweets and sugar as sugar use has been linked to worsening of memory function.  There is always a concern of gradual progression of memory problems. If this is the case, then we may need to adjust level of care according to patient needs. Support, both to the patient and caregiver, should then be put into place.    The Alzheimer's Association is here all day, every day for people facing Alzheimer's disease through our free 24/7 Helpline: [phone]. The Helpline provides reliable information and support to all those who need assistance, such as individuals living with memory loss, Alzheimer's or other dementia, caregivers, health care professionals and the public.  Our highly trained and knowledgeable staff can help you with: Understanding memory loss, dementia and Alzheimer's  Medications and other treatment options  General  information about aging and brain health  Skills to provide quality care and to find the best care from professionals  Legal, financial and living-arrangement decisions Our Helpline also  features: Confidential care consultation provided by master's level clinicians who can help with decision-making support, crisis assistance and education on issues families face every day  Help in a caller's preferred language using our translation service that features more than 200 languages and dialects  Referrals to local community programs, services and ongoing support     FALL PRECAUTIONS: Be cautious when walking. Scan the area for obstacles that may increase the risk of trips and falls. When getting up in the mornings, sit up at the edge of the bed for a few minutes before getting out of bed. Consider elevating the bed at the head end to avoid drop of blood pressure when getting up. Walk always in a well-lit room (use night lights in the walls). Avoid area rugs or power cords from appliances in the middle of the walkways. Use a walker or a cane if necessary and consider physical therapy for balance exercise. Get your eyesight checked regularly.  FINANCIAL OVERSIGHT: Supervision, especially oversight when making financial decisions or transactions is also recommended.  HOME SAFETY: Consider the safety of the kitchen when operating appliances like stoves, microwave oven, and blender. Consider having supervision and share cooking responsibilities until no longer able to participate in those. Accidents with firearms and other hazards in the house should be identified and addressed as well.   ABILITY TO BE LEFT ALONE: If patient is unable to contact 911 operator, consider using LifeLine, or when the need is there, arrange for someone to stay with patients. Smoking is a fire hazard, consider supervision or cessation. Risk of wandering should be assessed by caregiver and if detected at any point, supervision and safe proof recommendations should be instituted.  MEDICATION SUPERVISION: Inability to self-administer medication needs to be constantly addressed. Implement a mechanism to ensure safe  administration of the medications.   DRIVING: Regarding driving, in patients with progressive memory problems, driving will be impaired. We advise to have someone else do the driving if trouble finding directions or if minor accidents are reported. Independent driving assessment is available to determine safety of driving.   If you are interested in the driving assessment, you can contact the following:  [employer] in [place] [phone]  [facility] [phone]  [facility] [phone] or [phone]      Mediterranean Diet A Mediterranean diet refers to food and lifestyle choices that are based on the traditions of countries located on the [employer]. This way of eating has been shown to help prevent certain conditions and improve outcomes for people who have chronic diseases, like kidney disease and heart disease. What are tips for following this plan? Lifestyle  Cook and eat meals together with your family, when possible. Drink enough fluid to keep your urine clear or pale yellow. Be physically active every day. This includes: Aerobic exercise like running or swimming. Leisure activities like gardening, walking, or housework. Get 7-8 hours of sleep each night. If recommended by your health care provider, drink red wine in moderation. This means 1 glass a day for nonpregnant women and 2 glasses a day for men. A glass of wine equals 5 oz (150 mL). Reading food labels  Check the serving size of packaged foods. For foods such as rice and pasta, the serving size refers to the  amount of cooked product, not dry. Check the total fat in packaged foods. Avoid foods that have saturated fat or trans fats. Check the ingredients list for added sugars, such as corn syrup. Shopping  At the grocery store, buy most of your food from the areas near the walls of the store. This includes: Fresh fruits and  vegetables (produce). Grains, beans, nuts, and seeds. Some of these may be available in unpackaged forms or large amounts (in bulk). Fresh seafood. Poultry and eggs. Low-fat dairy products. Buy whole ingredients instead of prepackaged foods. Buy fresh fruits and vegetables in-season from local farmers markets. Buy frozen fruits and vegetables in resealable bags. If you do not have access to quality fresh seafood, buy precooked frozen shrimp or canned fish, such as tuna, salmon, or sardines. Buy small amounts of raw or cooked vegetables, salads, or olives from the deli or salad bar at your store. Stock your pantry so you always have certain foods on hand, such as olive oil, canned tuna, canned tomatoes, rice, pasta, and beans. Cooking  Cook foods with extra-virgin olive oil instead of using butter or other vegetable oils. Have meat as a side dish, and have vegetables or grains as your main dish. This means having meat in small portions or adding small amounts of meat to foods like pasta or stew. Use beans or vegetables instead of meat in common dishes like chili or lasagna. Experiment with different cooking methods. Try roasting or broiling vegetables instead of steaming or sauteing them. Add frozen vegetables to soups, stews, pasta, or rice. Add nuts or seeds for added healthy fat at each meal. You can add these to yogurt, salads, or vegetable dishes. Marinate fish or vegetables using olive oil, lemon juice, garlic, and fresh herbs. Meal planning  Plan to eat 1 vegetarian meal one day each week. Try to work up to 2 vegetarian meals, if possible. Eat seafood 2 or more times a week. Have healthy snacks readily available, such as: Vegetable sticks with hummus. Greek yogurt. Fruit and nut trail mix. Eat balanced meals throughout the week. This includes: Fruit: 2-3 servings a day Vegetables: 4-5 servings a day Low-fat dairy: 2 servings a day Fish, poultry, or lean meat: 1 serving a  day Beans and legumes: 2 or more servings a week Nuts and seeds: 1-2 servings a day Whole grains: 6-8 servings a day Extra-virgin olive oil: 3-4 servings a day Limit red meat and sweets to only a few servings a month What are my food choices? Mediterranean diet Recommended Grains: Whole-grain pasta. Brown rice. Bulgar wheat. Polenta. Couscous. Whole-wheat bread. Orpah Cobb. Vegetables: Artichokes. Beets. Broccoli. Cabbage. Carrots. Eggplant. Green beans. Chard. Kale. Spinach. Onions. Leeks. Peas. Squash. Tomatoes. Peppers. Radishes. Fruits: Apples. Apricots. Avocado. Berries. Bananas. Cherries. Dates. Figs. Grapes. Lemons. Melon. Oranges. Peaches. Plums. Pomegranate. Meats and other protein foods: Beans. Almonds. Sunflower seeds. Pine nuts. Peanuts. Cod. Salmon. Scallops. Shrimp. Tuna. Tilapia. Clams. Oysters. Eggs. Dairy: Low-fat milk. Cheese. Greek yogurt. Beverages: Water. Red wine. Herbal tea. Fats and oils: Extra virgin olive oil. Avocado oil. Grape seed oil. Sweets and desserts: [place] yogurt with honey. Baked apples. Poached pears. Trail mix. Seasoning and other foods: Basil. Cilantro. Coriander. Cumin. Mint. Parsley. Sage. Rosemary. Tarragon. Garlic. Oregano. Thyme. Pepper. Balsalmic vinegar. Tahini. Hummus. Tomato sauce. Olives. Mushrooms. Limit these Grains: Prepackaged pasta or rice dishes. Prepackaged cereal with added sugar. Vegetables: Deep fried potatoes (french fries). Fruits: Fruit canned in syrup. Meats and other protein foods: Beef. Pork. Lamb. Poultry with skin.  Hot dogs. Tomasa Blase. Dairy: Ice cream. Sour cream. Whole milk. Beverages: Juice. Sugar-sweetened soft drinks. Beer. Liquor and spirits. Fats and oils: Butter. Canola oil. Vegetable oil. Beef fat (tallow). Lard. Sweets and desserts: Cookies. Cakes. Pies. Candy. Seasoning and other foods: Mayonnaise. Premade sauces and marinades. The items listed may not be a complete list. Talk with your dietitian about what  dietary choices are right for you. Summary The Mediterranean diet includes both food and lifestyle choices. Eat a variety of fresh fruits and vegetables, beans, nuts, seeds, and whole grains. Limit the amount of red meat and sweets that you eat. Talk with your health care provider about whether it is safe for you to drink red wine in moderation. This means 1 glass a day for nonpregnant women and 2 glasses a day for men. A glass of wine equals 5 oz (150 mL). This information is not intended to replace advice given to you by your health care provider. Make sure you discuss any questions you have with your health care provider. Document Released: 10/12/2015 Document Revised: 11/14/2015 Document Reviewed: 10/12/2015 Elsevier Interactive Patient Education  2017 [employer].

## 2021-09-13 ENCOUNTER — Ambulatory Visit (INDEPENDENT_AMBULATORY_CARE_PROVIDER_SITE_OTHER): Payer: Medicare PPO | Admitting: Licensed Clinical Social Worker

## 2021-09-13 DIAGNOSIS — F03918 Unspecified dementia, unspecified severity, with other behavioral disturbance: Secondary | ICD-10-CM

## 2021-09-13 NOTE — BH Specialist Note (Signed)
Integrated Behavioral Health Initial In-Person Visit  MRN: [id number] Name: Patricia Davies  Number of Integrated Behavioral Health Clinician visits: No data recorded Session Start time: 0950    Session End time: 1045  Total time in minutes: 55   Types of Service: Family psychotherapy  Interpretor:No. Interpretor Name and Language: NA    Warm Hand Off Completed.        Subjective: Patricia Davies is a 76 y.o. female accompanied by  Daughter Patricia Davies  Patient was referred by Marlowe Kays  for Needing assistance the community/home, and caregiver needing assistance as well. 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   Self-Care: Hygiene has become more of an issue and needing more assistance with ADL's   Life Changes: Moved in with daughter and grand children last year   Patient and/or Family's Strengths/Protective Factors: Concrete supports in place (healthy food, safe environments, etc.)  Goals Addressed: Patient will: Reduce symptoms of: mood instability Increase knowledge and/or ability of: healthy habits and More asssitance    Demonstrate ability to: Increase adequate support systems for patient/family  Progress towards Goals: Ongoing  Interventions: Interventions utilized: Supportive Counseling and Link to [employer]  Standardized Assessments completed: Not Needed  Patient  and/or Family Response: Pt and family open to resources in the community maximize formal and informal support system   Patient Centered Plan: Patient is on the following Treatment Plan(s):  To contact community resources and gain as much knowledge on Pt's condition, and behaviors and challenges that may lie ahead    Assessment: Patient currently experiencing Increase changes with impairment, and Pt needing more care more assistance with ADL's  .   Patient may benefit from Link with community resources including but not limited to Adult day center [facility], [employer], Support Group ,[employer] program, Elder Social worker , medicaid CAP Senior Link [facility]  .  Plan: Follow up with behavioral health clinician on : As needed  Behavioral recommendations: To o contact community resources and gain as much knowledge on Pt's condition, and behaviors and challenges that may lie ahead   Referral(s): [employer] Resources:  Adult day center [facility], [employer], Support Group ,Energy manager program, Elder Social worker , medicaid [facility]  . "From scale of 1-10, how likely are you to follow plan?": 10  Derwood Becraft A Taylor-Paladino, LCSW

## 2021-09-27 ENCOUNTER — Telehealth: Payer: Medicare PPO | Admitting: Hospice

## 2021-09-27 DIAGNOSIS — Z515 Encounter for palliative care: Secondary | ICD-10-CM

## 2021-09-27 DIAGNOSIS — F0390 Unspecified dementia without behavioral disturbance: Secondary | ICD-10-CM

## 2021-09-27 DIAGNOSIS — E43 Unspecified severe protein-calorie malnutrition: Secondary | ICD-10-CM

## 2021-09-27 NOTE — Progress Notes (Signed)
Therapist, nutritional Palliative Care Consult Note Telephone: [phone]  Fax: [phone]  PATIENT NAME: Patricia Davies [address] [place] [address] [phone] (home)  DOB: [birth year] MRN: [id number]  PRIMARY CARE PROVIDER:    [name], MD,  [address] [place] [address] [phone]  REFERRING PROVIDER:   [name], MD [address] [place],  [place] [address] [phone]  RESPONSIBLE PARTY:   Patricia Davies Information     Name Relation Home Work Mobile   Patricia Davies Daughter [phone]         TELEHEALTH VISIT STATEMENT Due to the COVID-19 crisis, this visit was done via telemedicine from my office and it was initiated and consent by this patient and or family.  I connected with patient OR PROXY by a telephone/video  and verified that I am speaking with the correct person. I discussed the limitations of evaluation and management by telemedicine. Palliative Care was asked to follow this patient to address advance care planning, complex medical decision making and goals of care clarification.   CODE STATUS: Patricia Davies reports patient is a full code; open to further discuss CODE STATUS in the future with the possibility of changing.  Goals of Care: Goals include to maximize quality of life and symptom management.  Patricia Davies reports plans in final phase for patient to get on [facility] program.   Symptom Management/Plan:  Dementia: Continue ongoing supportive care; memory loss/confusion at baseline. Incontinent of bladder FAST 6C.  Ambulatory without assistive device.  Fall precautions reiterated.  Encourage reminiscence, socialization.   Protein Caloric Malnutrition: Height/weight: 5 feet 1 inch/ 87 Ibs down from 92 Ibs last month. BMI 17.38. Patricia Davies reports appetite is very good despite weight loss; offer assistance during meals to ensure adequate oral intake. Ensure BID.  F/u with PCP as planned. Routine CBC  CMP.   Follow up: Palliative care will continue to follow for complex medical decision making, advance care planning, and clarification of goals. Return 6 weeks or prn. Encouraged to call provider sooner with any concerns.   Family /Caregiver/Community Supports: Patient lives at home with Patricia Davies.  Strong family support system.  HOSPICE ELIGIBILITY/DIAGNOSIS: TBD  Chief Complaint: F/u visit  HISTORY OF PRESENT ILLNESS:  Patricia Davies is a 76 y.o. year old female  with multiple morbidities requiring close monitoring and with high risk of complications and  mortality: Dementia, protein caloric malnutrition, hypertension.  History obtained from review of EMR, discussion with primary team, caregiver, family and/or Ms. Patricia Davies.  Review and summarization of Epic records shows history from other than patient. Rest of 10 point ROS asked and negative.  I reviewed as needed, available labs, patient records, imaging, studies and related documents from the EMR.   PAST MEDICAL HISTORY:  Active Ambulatory Problems    Diagnosis Date Noted   Sarcoidosis 03/16/2010   Essential hypertension 03/16/2010   COPD mixed type (HCC) 04/05/2011   Vitamin B12 deficiency neuropathy (HCC) 09/15/2014   Dietary folate deficiency anemia 06/01/2015   Hyperlipidemia LDL goal <130 08/07/2016   Mycetoma, unspecified 08/08/2016   Mild cognitive impairment 12/02/2016   Protein-calorie malnutrition, moderate (HCC) 11/11/2017   Routine general medical examination at a health care facility 11/11/2017   Chronic hypokalemia 08/22/2021   Dementia with behavioral disturbance (HCC) 08/22/2021   Resolved Ambulatory Problems    Diagnosis Date Noted   EPISTAXIS, RECURRENT 03/16/2010   NEPHROLITHIASIS, HX OF 03/16/2010   Hyperglycemia 01/16/2011   Paresthesias 08/30/2014   Hematemesis  without nausea 09/15/2014   Hypokalemia 08/22/2015   Tachycardia 08/01/2016   Cough 08/07/2016   Memory loss or impairment 08/07/2016   Need  for hepatitis C screening test 08/07/2016   Abnormal CXR (chest x-ray) 08/08/2016   Deficiency anemia 08/08/2016   Pneumonia involving right lung 08/08/2016   Weakness 02/27/2017   Weight loss 04/21/2017   Fall with injury 05/20/2019   Past Medical History:  Diagnosis Date   COPD (chronic obstructive pulmonary disease) (HCC)    History of nephrolithiasis    Hypertension    Sarcoidosis of skin    Vitamin B12 deficiency     SOCIAL HX:  Social History   Tobacco Use   Smoking status: Former    Packs/day: 0.50    Years: 20.00    Total pack years: 10.00    Types: Cigarettes    Quit date: 03/04/1990    Years since quitting: 31.5   Smokeless tobacco: Never   Tobacco comments:    Regukar exercise - Yes  Substance Use Topics   Alcohol use: Yes    Alcohol/week: 1.0 standard drink of alcohol    Types: 1 Cans of beer per week    Comment: social     FAMILY HX:  Family History  Problem Relation Age of Onset   Hypertension Maternal Aunt    Diabetes Maternal Aunt       ALLERGIES: No Known Allergies    PERTINENT MEDICATIONS:  Outpatient Encounter Medications as of 09/27/2021  Medication Sig   acetaminophen (TYLENOL) 325 MG tablet Take 650 mg by mouth every 6 (six) hours as needed.   ibuprofen (ADVIL) 200 MG tablet Take 400 mg by mouth every 6 (six) hours as needed for headache or mild pain.   memantine (NAMENDA) 10 MG tablet Take 1 tablet (10 mg at night) for 2 weeks, then increase to 1 tablet (10 mg) twice a day   No facility-administered encounter medications on file as of 09/27/2021.   I spent 45 minutes providing this initial consultation; this includes time spent with patient/family, chart review and documentation. More than 50% of the time in this consultation was spent on counseling and coordinating communication .  Thank you for the opportunity to participate in the care of Ms. Patricia Davies.  The palliative care team will continue to follow. Please call our office at  [phone] if we can be of additional assistance.   Note: Portions of this note were generated with Scientist, clinical (histocompatibility and immunogenetics). Dictation errors may occur despite best attempts at proofreading.  Rosaura Carpenter, NP

## 2021-12-05 NOTE — Progress Notes (Unsigned)
Subjective:   Patricia Davies is a 76 y.o. female who presents for an Initial Medicare Annual Wellness Visit. I connected with  Patricia Davies on 12/05/21 by a audio enabled telemedicine application and verified that I am speaking with the correct person using two identifiers.  Patient Location: Home  Provider Location: Home Office  I discussed the limitations of evaluation and management by telemedicine. The patient expressed understanding and agreed to proceed.  Review of Systems    Deferred to PCP       Objective:    There were no vitals filed for this visit. There is no height or weight on file to calculate BMI.     09/07/2021   10:14 AM 06/21/2021   10:27 AM 03/16/2015    6:24 PM  Advanced Directives  Does Patient Have a Medical Advance Directive? No No No  Would patient like information on creating a medical advance directive?   No - patient declined information    Current Medications (verified) Outpatient Encounter Medications as of 12/06/2021  Medication Sig   acetaminophen (TYLENOL) 325 MG tablet Take 650 mg by mouth every 6 (six) hours as needed.   ibuprofen (ADVIL) 200 MG tablet Take 400 mg by mouth every 6 (six) hours as needed for headache or mild pain.   memantine (NAMENDA) 10 MG tablet Take 1 tablet (10 mg at night) for 2 weeks, then increase to 1 tablet (10 mg) twice a day   No facility-administered encounter medications on file as of 12/06/2021.    Allergies (verified) Patient has no known allergies.   History: Past Medical History:  Diagnosis Date   COPD (chronic obstructive pulmonary disease) ([place])    History of nephrolithiasis    Hypertension    Sarcoidosis of skin    Vitamin B12 deficiency    Past Surgical History:  Procedure Laterality Date   ABDOMINAL HYSTERECTOMY     CHOLECYSTECTOMY     Family History  Problem Relation Age of Onset   Hypertension Maternal Aunt    Diabetes Maternal Aunt    Social History   Socioeconomic History    Marital status: Single    Spouse name: Not on file   Number of children: 2   Years of education: 14   Highest education level: Not on file  Occupational History   Occupation: retired    Comment: Pharmacist, hospital  Tobacco Use   Smoking status: Former    Packs/day: 0.50    Years: 20.00    Total pack years: 10.00    Types: Cigarettes    Quit date: 03/04/1990    Years since quitting: 31.7   Smokeless tobacco: Never   Tobacco comments:    Regukar exercise - Yes  Substance and Sexual Activity   Alcohol use: Yes    Alcohol/week: 1.0 standard drink of alcohol    Types: 1 Cans of beer per week    Comment: social   Drug use: No   Sexual activity: Not Currently  Other Topics Concern   Not on file  Social History Narrative   Pt lives in 1 story home with friend   Has 2 adult children   Highest level of education: BS   Retired Education officer, museum   Drinks sodas   One story home   Social Determinants of Health   Financial Resource Strain: Not on file  Food Insecurity: Not on file  Transportation Needs: Not on file  Physical Activity: Not on file  Stress: Not on file  Social Connections: Not on file    Tobacco Counseling Counseling given: Not Answered Tobacco comments: Regukar exercise - Yes   Clinical Intake:                 Diabetic?No         Activities of Daily Living     No data to display           Patient Care Team: [name], MD as PCP - General (Internal Medicine)  Indicate any recent Medical Services you may have received from other than [facility] providers in the past year (date may be approximate).     Assessment:   This is a routine wellness examination for [place].  Hearing/Vision screen No results found.  Dietary issues and exercise activities discussed:     Goals Addressed   None   Depression Screen    08/22/2021    1:57 PM 11/12/2017    1:05 PM 05/06/2017   12:05 PM  PHQ 2/9 Scores  PHQ - 2 Score 0 0 0  PHQ- 9 Score 0      Fall  Risk    09/07/2021   10:14 AM 08/22/2021    1:57 PM 11/12/2017    1:05 PM 05/06/2017   12:05 PM 12/02/2016   10:46 AM  Fall Risk   Falls in the past year? 1 0 No No Yes  Number falls in past yr: 1    1  Injury with Fall? 0    No    FALL RISK PREVENTION PERTAINING TO THE HOME:  Any stairs in or around the home? {YES/NO:21197} If so, are there any without handrails? {YES/NO:21197} Home free of loose throw rugs in walkways, pet beds, electrical cords, etc? {YES/NO:21197} Adequate lighting in your home to reduce risk of falls? {YES/NO:21197}  ASSISTIVE DEVICES UTILIZED TO PREVENT FALLS:  Life alert? {YES/NO:21197} Use of a cane, walker or w/c? {YES/NO:21197} Grab bars in the bathroom? {YES/NO:21197} Shower chair or bench in shower? {YES/NO:21197} Elevated toilet seat or a handicapped toilet? {YES/NO:21197}  Cognitive Function:    12/02/2016   11:00 AM  MMSE - Mini Mental State Exam  Orientation to time 4  Orientation to Place 5  Registration 3  Attention/ Calculation 4  Recall 0  Language- name 2 objects 2  Language- repeat 1  Language- follow 3 step command 2  Language- read & follow direction 1  Write a sentence 1  Copy design 1  Total score 24        Immunizations Immunization History  Administered Date(s) Administered   Fluad Quad(high Dose 65+) 11/09/2018   Influenza Split 12/03/2010, 12/02/2012, 12/02/2013, 12/18/2016   Influenza, High Dose Seasonal PF 11/11/2017   Influenza,inj,Quad PF,6+ Mos 12/03/2015   PFIZER(Purple Top)SARS-COV-2 Vaccination 08/18/2019, 09/15/2019   Pneumococcal Conjugate-13 08/07/2016   Pneumococcal Polysaccharide-23 11/11/2017   Td 03/04/2009    TDAP status: Due, Education has been provided regarding the importance of this vaccine. Advised may receive this vaccine at local pharmacy or Health Dept. Aware to provide a copy of the vaccination record if obtained from local pharmacy or Health Dept. Verbalized acceptance and  understanding.  Flu Vaccine status: Due, Education has been provided regarding the importance of this vaccine. Advised may receive this vaccine at local pharmacy or Health Dept. Aware to provide a copy of the vaccination record if obtained from local pharmacy or Health Dept. Verbalized acceptance and understanding.  Pneumococcal vaccine status: Up to date  Covid-19 vaccine status: Information provided on how  to obtain vaccines.   Qualifies for Shingles Vaccine? Yes   Zostavax completed No   Shingrix Completed?: No.    Education has been provided regarding the importance of this vaccine. Patient has been advised to call insurance company to determine out of pocket expense if they have not yet received this vaccine. Advised may also receive vaccine at local pharmacy or Health Dept. Verbalized acceptance and understanding.  Screening Tests Health Maintenance  Topic Date Due   Zoster Vaccines- Shingrix (1 of 2) Never done   MAMMOGRAM  03/16/2012   DEXA SCAN  Never done   TETANUS/TDAP  03/05/2019   COVID-19 Vaccine (3 - Pfizer series) 11/10/2019   INFLUENZA VACCINE  10/02/2021   COLONOSCOPY (Pts 45-29yrs Insurance coverage will need to be confirmed)  06/14/2031   Pneumonia Vaccine 95+ Years old  Completed   Hepatitis C Screening  Completed   HPV VACCINES  Aged Out    Health Maintenance  Health Maintenance Due  Topic Date Due   Zoster Vaccines- Shingrix (1 of 2) Never done   MAMMOGRAM  03/16/2012   DEXA SCAN  Never done   TETANUS/TDAP  03/05/2019   COVID-19 Vaccine (3 - Pfizer series) 11/10/2019   INFLUENZA VACCINE  10/02/2021    Colorectal cancer screening: Type of screening: Colonoscopy. Completed 06/13/21. Repeat every 10 years  {Mammogram status:21018020}  {Bone Density status:21018021}  Lung Cancer Screening: (Low Dose CT Chest recommended if Age 37-80 years, 30 pack-year currently smoking OR have quit w/in 15years.) does not qualify.   Additional Screening:  Hepatitis  C Screening: does qualify; Completed 08/07/16  Vision Screening: Recommended annual ophthalmology exams for early detection of glaucoma and other disorders of the eye. Is the patient up to date with their annual eye exam?  {YES/NO:21197} Who is the provider or what is the name of the office in which the patient attends annual eye exams? *** If pt is not established with a provider, would they like to be referred to a provider to establish care? {YES/NO:21197}.   Dental Screening: Recommended annual dental exams for proper oral hygiene  Community Resource Referral / Chronic Care Management: CRR required this visit?  {YES/NO:21197}  CCM required this visit?  {YES/NO:21197}     Plan:     I have personally reviewed and noted the following in the patient's chart:   Medical and social history Use of alcohol, tobacco or illicit drugs  Current medications and supplements including opioid prescriptions. Patient is not currently taking opioid prescriptions. Functional ability and status Nutritional status Physical activity Advanced directives List of other physicians Hospitalizations, surgeries, and ER visits in previous 12 months Vitals Screenings to include cognitive, depression, and falls Referrals and appointments  In addition, I have reviewed and discussed with patient certain preventive protocols, quality metrics, and best practice recommendations. A written personalized care plan for preventive services as well as general preventive health recommendations were provided to patient.     Wanda Plump, RN   12/05/2021   Nurse Notes:  Ms. Devora , Thank you for taking time to come for your Medicare Wellness Visit. I appreciate your ongoing commitment to your health goals. Please review the following plan we discussed and let me know if I can assist you in the future.   These are the goals we discussed:  Goals   None     This is a list of the screening recommended for you and due  dates:  Health Maintenance  Topic Date Due  Zoster (Shingles) Vaccine (1 of 2) Never done   Mammogram  03/16/2012   DEXA scan (bone density measurement)  Never done   Tetanus Vaccine  03/05/2019   COVID-19 Vaccine (3 - Pfizer series) 11/10/2019   Flu Shot  10/02/2021   Colon Cancer Screening  06/14/2031   Pneumonia Vaccine  Completed   Hepatitis C Screening: USPSTF Recommendation to screen - Ages 18-79 yo.  Completed   HPV Vaccine  Aged Out

## 2021-12-05 NOTE — Patient Instructions (Signed)

## 2021-12-06 ENCOUNTER — Ambulatory Visit (INDEPENDENT_AMBULATORY_CARE_PROVIDER_SITE_OTHER): Payer: Medicare PPO | Admitting: *Deleted

## 2021-12-06 DIAGNOSIS — Z0000 Encounter for general adult medical examination without abnormal findings: Secondary | ICD-10-CM | POA: Diagnosis not present

## 2022-02-12 ENCOUNTER — Ambulatory Visit: Payer: Medicare PPO | Admitting: Internal Medicine

## 2022-03-12 ENCOUNTER — Encounter: Payer: Self-pay | Admitting: Physician Assistant

## 2022-03-12 ENCOUNTER — Ambulatory Visit: Payer: Medicare PPO | Admitting: Physician Assistant

## 2022-03-12 DIAGNOSIS — Z029 Encounter for administrative examinations, unspecified: Secondary | ICD-10-CM
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
## 2022-03-19 ENCOUNTER — Ambulatory Visit: Payer: Medicare PPO | Admitting: Internal Medicine

## 2022-05-01 ENCOUNTER — Ambulatory Visit: Payer: Medicare PPO | Admitting: Internal Medicine

## 2022-08-28 ENCOUNTER — Encounter (HOSPITAL_COMMUNITY): Payer: Self-pay | Admitting: Emergency Medicine

## 2022-08-28 ENCOUNTER — Emergency Department (HOSPITAL_COMMUNITY)
Admission: EM | Admit: 2022-08-28 | Discharge: 2022-08-28 | Disposition: A | Discharge status: Home / Self Care | Payer: Medicare PPO | Attending: Emergency Medicine | Admitting: Emergency Medicine

## 2022-08-28 ENCOUNTER — Other Ambulatory Visit: Payer: Self-pay

## 2022-08-28 ENCOUNTER — Emergency Department (HOSPITAL_COMMUNITY): Payer: Medicare PPO

## 2022-08-28 DIAGNOSIS — I10 Essential (primary) hypertension: Secondary | ICD-10-CM | POA: Insufficient documentation

## 2022-08-28 DIAGNOSIS — R1031 Right lower quadrant pain: Secondary | ICD-10-CM | POA: Insufficient documentation

## 2022-08-28 DIAGNOSIS — J449 Chronic obstructive pulmonary disease, unspecified: Secondary | ICD-10-CM | POA: Diagnosis not present

## 2022-08-28 DIAGNOSIS — N3289 Other specified disorders of bladder: Secondary | ICD-10-CM | POA: Diagnosis not present

## 2022-08-28 LAB — COMPREHENSIVE METABOLIC PANEL WITH GFR
ALT: 9 U/L (ref 0–44)
AST: 12 U/L — ABNORMAL LOW (ref 15–41)
Albumin: 3.3 g/dL — ABNORMAL LOW (ref 3.5–5.0)
Alkaline Phosphatase: 50 U/L (ref 38–126)
Anion gap: 9 (ref 5–15)
BUN: 12 mg/dL (ref 8–23)
CO2: 26 mmol/L (ref 22–32)
Calcium: 8.8 mg/dL — ABNORMAL LOW (ref 8.9–10.3)
Chloride: 108 mmol/L (ref 98–111)
Creatinine, Ser: 0.61 mg/dL (ref 0.44–1.00)
GFR, Estimated: >60 mL/min
Glucose, Bld: 96 mg/dL (ref 70–99)
Potassium: 3.3 mmol/L — ABNORMAL LOW (ref 3.5–5.1)
Sodium: 143 mmol/L (ref 135–145)
Total Bilirubin: 0.5 mg/dL (ref 0.3–1.2)
Total Protein: 6.3 g/dL — ABNORMAL LOW (ref 6.5–8.1)

## 2022-08-28 LAB — URINALYSIS, ROUTINE W REFLEX MICROSCOPIC
Bilirubin Urine: NEGATIVE
Glucose, UA: NEGATIVE mg/dL
Ketones, ur: NEGATIVE mg/dL
Leukocytes,Ua: NEGATIVE
Nitrite: NEGATIVE
Protein, ur: 100 mg/dL — AB
Specific Gravity, Urine: 1.024 (ref 1.005–1.030)
pH: 5 (ref 5.0–8.0)

## 2022-08-28 LAB — CBC
HCT: 31.9 % — ABNORMAL LOW (ref 36.0–46.0)
Hemoglobin: 10 g/dL — ABNORMAL LOW (ref 12.0–15.0)
MCH: 28.8 pg (ref 26.0–34.0)
MCHC: 31.3 g/dL (ref 30.0–36.0)
MCV: 91.9 fL (ref 80.0–100.0)
Platelets: 269 10*3/uL (ref 150–400)
RBC: 3.47 MIL/uL — ABNORMAL LOW (ref 3.87–5.11)
RDW: 13.7 % (ref 11.5–15.5)
WBC: 4.9 10*3/uL (ref 4.0–10.5)
nRBC: 0 % (ref 0.0–0.2)

## 2022-08-28 LAB — LIPASE, BLOOD: Lipase: 58 U/L — ABNORMAL HIGH (ref 11–51)

## 2022-08-28 MED ORDER — ACETAMINOPHEN 500 MG PO TABS
1000.0000 mg | ORAL_TABLET | ORAL | Status: AC
  Administered 2022-08-28: 1000 mg via ORAL
  Filled 2022-08-28: qty 2

## 2022-08-28 MED ORDER — FENTANYL CITRATE PF 50 MCG/ML IJ SOSY
50.0000 ug | PREFILLED_SYRINGE | Freq: Once | INTRAMUSCULAR | Status: AC
  Administered 2022-08-28: 50 ug via INTRAVENOUS
  Filled 2022-08-28: qty 1

## 2022-08-28 MED ORDER — FENTANYL CITRATE PF 50 MCG/ML IJ SOSY: 50.0000 ug | PREFILLED_SYRINGE | INTRAMUSCULAR | Status: DC | PRN

## 2022-08-28 MED ORDER — LACTATED RINGERS IV BOLUS
500.0000 mL | Freq: Once | INTRAVENOUS | Status: AC
  Administered 2022-08-28: 500 mL via INTRAVENOUS

## 2022-08-28 MED ORDER — IOHEXOL 350 MG/ML SOLN
75.0000 mL | Freq: Once | INTRAVENOUS | Status: AC | PRN
  Administered 2022-08-28: 75 mL via INTRAVENOUS

## 2022-08-28 NOTE — Discharge Instructions (Signed)
Your workup today was quite reassuring.  As we discussed, your CT scan does not show any evidence of any issues of your appendix or any other emergent issues.  Your symptoms certainly could be related to muscular pain or constipation.  Make sure you are drinking plenty of water, use Tylenol 1000 mg every 6 hours as needed for pain.  I recommend following up closely with your primary care doctor to be seen within 1 week for reevaluation. Your hemoglobin/red blood cell count was slightly on the low end of normal which is not very unusual for you however I would like to have this rechecked by your primary care doctor as well.

## 2022-08-28 NOTE — ED Triage Notes (Signed)
Patient arrives in wheelchair by POV with daughter c/o right lower abdominal pain onset of yesterday. Denies any urinary symptoms. Daughter states patient eating and drinking regularly.

## 2022-08-28 NOTE — ED Notes (Signed)
Patient transported to CT 

## 2022-08-28 NOTE — ED Provider Notes (Signed)
[place] EMERGENCY DEPARTMENT AT [facility] Provider Note   CSN: [id number] Arrival date & time: 08/28/22  1231     History  Chief Complaint  Patient presents with   Abdominal Pain    Patricia Davies is a 77 y.o. female.   Abdominal Pain  Patient is a 77 year old female with past medical history significant for HTN and COPD  She has a history of cholecystectomy and abdominal hysterectomy  Patient states that for the past 2 days she has had some intermittent right lower abdominal pain.  It has been present more than has been absent and is worsened by going over bumps in the road or pushing on the area.  She states certain movements make it worse as well.  Denies any nausea or vomiting.  States that she has been pooping normally no blood in her stool no diarrhea.  She denies any chest pain or difficulty breathing no fevers or urinary frequency urgency dysuria or hematuria.       Home Medications Prior to Admission medications   Medication Sig Start Date End Date Taking? Authorizing Provider  acetaminophen (TYLENOL) 325 MG tablet Take 650 mg by mouth every 6 (six) hours as needed.    [provider]  ibuprofen (ADVIL) 200 MG tablet Take 400 mg by mouth every 6 (six) hours as needed for headache or mild pain.    [provider]  memantine (NAMENDA) 10 MG tablet Take 1 tablet (10 mg at night) for 2 weeks, then increase to 1 tablet (10 mg) twice a day 09/07/21   Marcos Eke, PA-C      Allergies    Patient has no known allergies.    Review of Systems   Review of Systems  Gastrointestinal:  Positive for abdominal pain.    Physical Exam Updated Vital Signs BP (!) 178/66   Pulse (!) 59   Temp 98.3 F (36.8 C)   Resp 16   Ht 5\' 1"  (1.549 m)   Wt 39.5 kg   SpO2 100%   BMI 16.44 kg/m  Physical Exam Vitals and nursing note reviewed.  Constitutional:      General: She is not in acute distress. HENT:     Head: Normocephalic and atraumatic.      Nose: Nose normal.  Eyes:     General: No scleral icterus. Cardiovascular:     Rate and Rhythm: Normal rate and regular rhythm.     Pulses: Normal pulses.     Heart sounds: Normal heart sounds.  Pulmonary:     Effort: Pulmonary effort is normal. No respiratory distress.     Breath sounds: No wheezing.  Abdominal:     Palpations: Abdomen is soft.     Tenderness: There is abdominal tenderness. There is no guarding or rebound.     Comments: RLQ TTP, no rebound. No other areas of TTP.  Reproducibly TTP in RLQ.   Musculoskeletal:     Cervical back: Normal range of motion.     Right lower leg: No edema.     Left lower leg: No edema.  Skin:    General: Skin is warm and dry.     Capillary Refill: Capillary refill takes less than 2 seconds.  Neurological:     Mental Status: She is alert. Mental status is at baseline.  Psychiatric:        Mood and Affect: Mood normal.        Behavior: Behavior normal.     ED  Results / Procedures / Treatments   Labs (all labs ordered are listed, but only abnormal results are displayed) Labs Reviewed  LIPASE, BLOOD - Abnormal; Notable for the following components:      Result Value   Lipase 58 (*)    All other components within normal limits  COMPREHENSIVE METABOLIC PANEL - Abnormal; Notable for the following components:   Potassium 3.3 (*)    Calcium 8.8 (*)    Total Protein 6.3 (*)    Albumin 3.3 (*)    AST 12 (*)    All other components within normal limits  CBC - Abnormal; Notable for the following components:   RBC 3.47 (*)    Hemoglobin 10.0 (*)    HCT 31.9 (*)    All other components within normal limits  URINALYSIS, ROUTINE W REFLEX MICROSCOPIC - Abnormal; Notable for the following components:   Hgb urine dipstick SMALL (*)    Protein, ur 100 (*)    Bacteria, UA RARE (*)    All other components within normal limits    EKG None  Radiology CT ABDOMEN PELVIS W CONTRAST  Result Date: 08/28/2022 CLINICAL DATA:  RLQ abdominal  pain focal RLQ abd TTP, guarding focally. No other abd TTP EXAM: CT ABDOMEN AND PELVIS WITH CONTRAST TECHNIQUE: Multidetector CT imaging of the abdomen and pelvis was performed using the standard protocol following bolus administration of intravenous contrast. RADIATION DOSE REDUCTION: This exam was performed according to the departmental dose-optimization program which includes automated exposure control, adjustment of the mA and/or kV according to patient size and/or use of iterative reconstruction technique. CONTRAST:  75mL OMNIPAQUE IOHEXOL 350 MG/ML SOLN COMPARISON:  06/21/2021 FINDINGS: Lower chest: No pleural or pericardial effusion. Prominent subpleural bulla anteromedial right lung base as before. Hepatobiliary: No focal liver abnormality is seen. Status post cholecystectomy. No biliary dilatation. Pancreas: Unremarkable. No pancreatic ductal dilatation or surrounding inflammatory changes. Spleen: Normal in size without focal abnormality. Adrenals/Urinary Tract: No adrenal mass. Symmetric bilateral renal parenchymal enhancement without focal lesion or hydronephrosis. 3 mm calculus in the upper pole left renal collecting system. Urinary bladder incompletely distended. Stomach/Bowel: Stomach is incompletely distended, without acute finding. Small bowel decompressed. Appendix not localized. No pericecal inflammatory change. Moderate colonic fecal material without dilatation or other acute finding. Vascular/Lymphatic: Moderate scattered aortoiliac calcified atheromatous plaque without aneurysm or evident stenosis. No abdominal or pelvic adenopathy. Portal vein patent. Reproductive: Status post hysterectomy. No adnexal masses. Other: No ascites.  No free air. Musculoskeletal: Advanced degenerative disc disease and facet DJD bilaterally L4-5 probably accounting for the grade 1 anterolisthesis , stable since previous. AVN bilateral femoral heads. No fracture or worrisome bone lesion. IMPRESSION: 1. No acute  findings. 2. Nonobstructive left nephrolithiasis. 3.  Aortic Atherosclerosis (ICD10-I70.0). Electronically Signed   By: Corlis Leak M.D.   On: 08/28/2022 15:42    Procedures Procedures    Medications Ordered in ED Medications  lactated ringers bolus 500 mL (500 mLs Intravenous New Bag/Given 08/28/22 1434)  fentaNYL (SUBLIMAZE) injection 50 mcg (50 mcg Intravenous Given 08/28/22 1432)  iohexol (OMNIPAQUE) 350 MG/ML injection 75 mL (75 mLs Intravenous Contrast Given 08/28/22 1455)  acetaminophen (TYLENOL) tablet 1,000 mg (1,000 mg Oral Given 08/28/22 1621)    ED Course/ Medical Decision Making/ A&P                             Medical Decision Making Amount and/or Complexity of Data Reviewed Labs: ordered. Radiology: ordered.  Risk OTC drugs. Prescription drug management.   This patient presents to the ED for concern of abd pain, this involves a number of treatment options, and is a complaint that carries with it a moderate risk of complications and morbidity. A differential diagnosis was considered for the patient's symptoms which is discussed below:   The causes of generalized abdominal pain include but are not limited to AAA, mesenteric ischemia, appendicitis, diverticulitis, DKA, gastritis, gastroenteritis, AMI, nephrolithiasis, pancreatitis, peritonitis, adrenal insufficiency,lead poisoning, iron toxicity, intestinal ischemia, constipation, UTI,SBO/LBO, splenic rupture, biliary disease, IBD, IBS, PUD, or hepatitis. Ectopic pregnancy, ovarian torsion, PID.    Co morbidities: Discussed in HPI   Brief History:  Patient is a 77 year old female with past medical history significant for HTN and COPD  She has a history of cholecystectomy and abdominal hysterectomy  Patient states that for the past 2 days she has had some intermittent right lower abdominal pain.  It has been present more than has been absent and is worsened by going over bumps in the road or pushing on the area.  She  states certain movements make it worse as well.  Denies any nausea or vomiting.  States that she has been pooping normally no blood in her stool no diarrhea.  She denies any chest pain or difficulty breathing no fevers or urinary frequency urgency dysuria or hematuria.    EMR reviewed including pt PMHx, past surgical history and past visits to ER.   See HPI for more details   Lab Tests:  CMP without any acute abnormal findings, CBC with some anemia of 10, not significantly lower than she has been in the past however she will need to have this rechecked with primary care.  Lipase marginally elevated perhaps some degree of dehydration does not fit with history and physical exam for pancreatitis.  Urinalysis unremarkable   Imaging Studies:  NAD. I personally reviewed all imaging studies and no acute abnormality found. I agree with radiology interpretation.    Cardiac Monitoring:  The patient was maintained on a cardiac monitor.  I personally viewed and interpreted the cardiac monitored which showed an underlying rhythm of: NSR NA   Medicines ordered:  I ordered medication including Tylenol, fentanyl, 500 mL of lactated Ringer's for pain, hydration Reevaluation of the patient after these medicines showed that the patient improved I have reviewed the patients home medicines and have made adjustments as needed   Critical Interventions:     Consults/Attending Physician      Reevaluation:  After the interventions noted above I re-evaluated patient and found that they have :improved   Social Determinants of Health:      Problem List / ED Course:  Patient of right lower quadrant abdominal pain for over 24 hours.  No nausea or vomiting.  Focally tender in right lower quadrant given a dose of Tylenol and fentanyl here CT abdomen pelvis without abnormal finding labs unremarkable apart from mild anemia.  She will follow-up with PCP regarding this.  Return precautions  discussed for reasons to come back to emergency room for any other new or concerning symptoms otherwise primary care will be her next follow-up hopefully she can be seen within the next few days to 1 week.   Dispostion:  After consideration of the diagnostic results and the patients response to treatment, I feel that the patent would benefit from discharge  Final Clinical Impression(s) / ED Diagnoses Final diagnoses:  Right lower quadrant abdominal pain    Rx / DC Orders  ED Discharge Orders     None         Gailen Shelter, [place] 08/28/22 1626    Benjiman Core, MD 08/28/22 [phone]

## 2022-09-04 ENCOUNTER — Telehealth: Payer: Self-pay | Admitting: *Deleted

## 2022-09-04 NOTE — Telephone Encounter (Signed)
Transition Care Management Unsuccessful Follow-up Telephone Call  Date of discharge and from where:  [place] 6/26  Attempts:  1st Attempt  Reason for unsuccessful TCM follow-up call:  Left voice message

## 2022-09-09 ENCOUNTER — Telehealth: Payer: Self-pay | Admitting: *Deleted

## 2022-09-09 NOTE — Telephone Encounter (Signed)
Transition Care Management Unsuccessful Follow-up Telephone Call  Date of discharge and from where:  [place] 6/26  Attempts:  2nd Attempt  Reason for unsuccessful TCM follow-up call:  Left voice message

## 2022-10-04 IMAGING — CT CT HEAD W/O CM
3 of 4 series · 15 of 47 positions shown, 18 images · non-contrast
Comparison: None.

CLINICAL DATA: Lower extremity weakness.



[Series 3: head 5.0 h30s · axial · 0.40mm/px · z∈[-128,-8]mm · 9 of 30 slices shown, 12 images]
[im 3/30  brain]
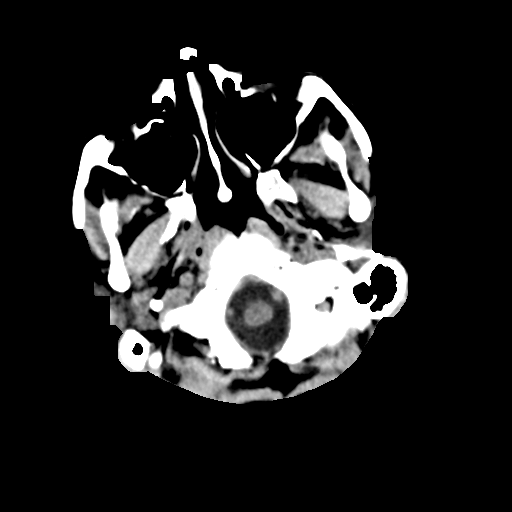
[im 3/30  bone]
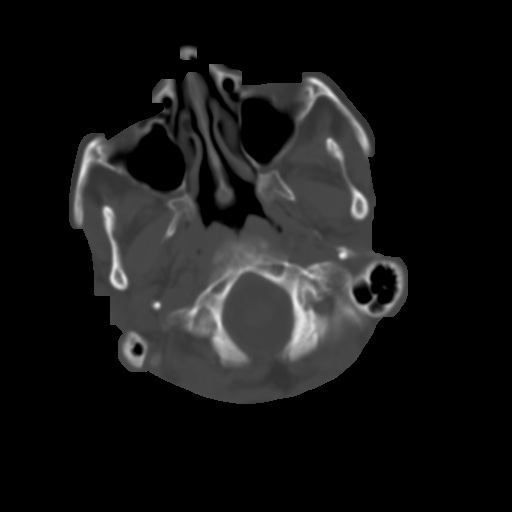
[im 7/30  brain]
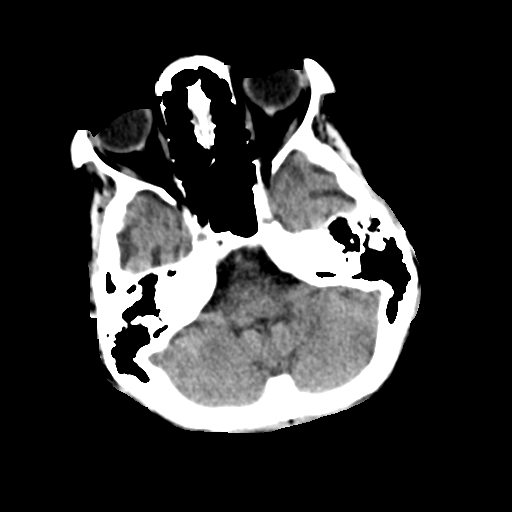
[im 9/30  brain]
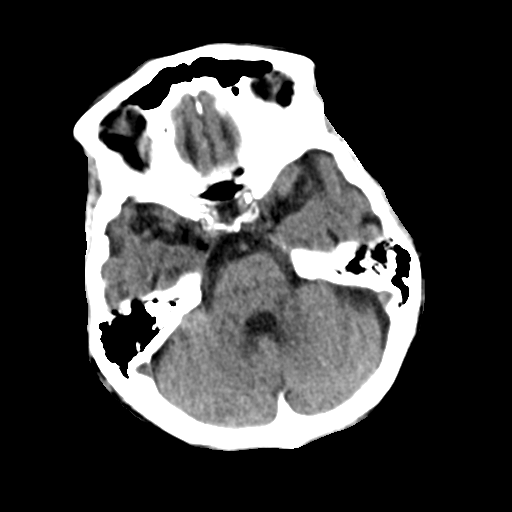
[im 13/30  brain]
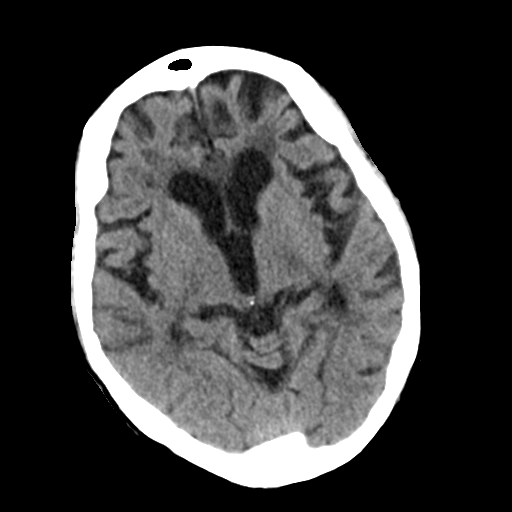
[im 15/30  brain]
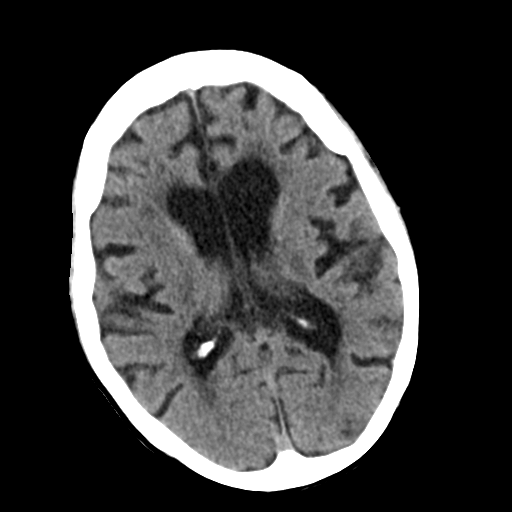
[im 15/30  bone]
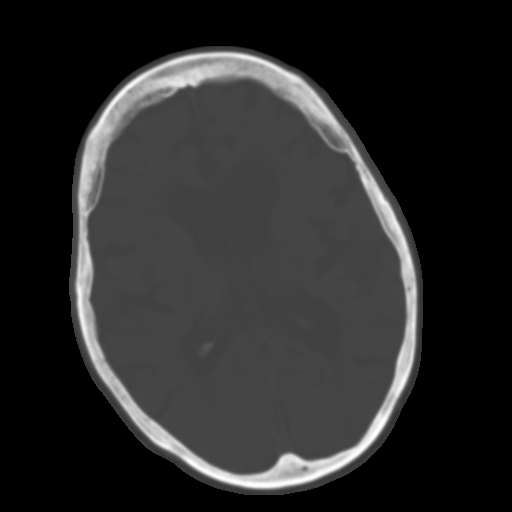
[im 17/30  brain]
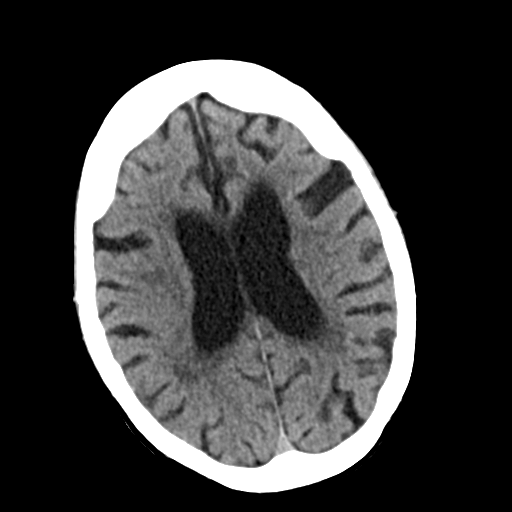
[im 21/30  brain]
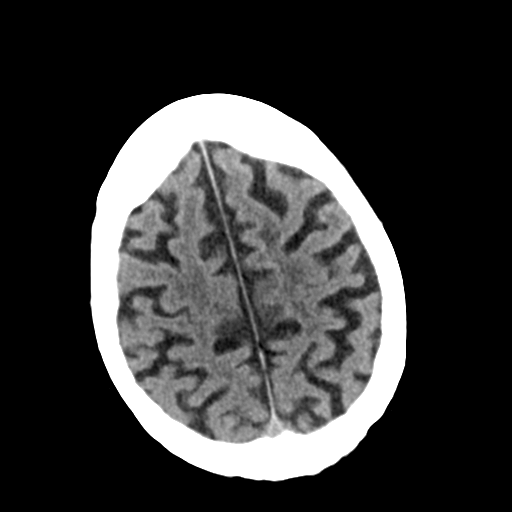
[im 23/30  brain]
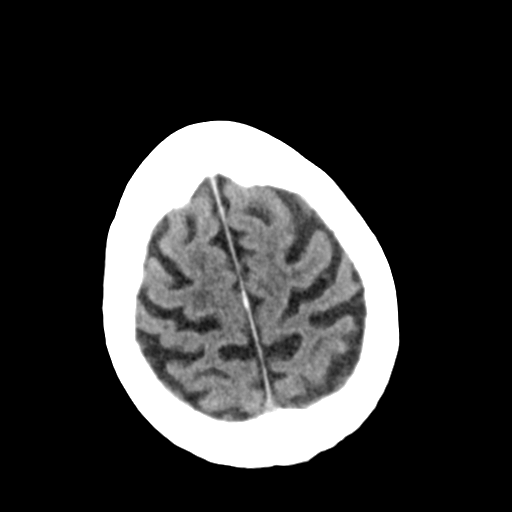
[im 27/30  brain]
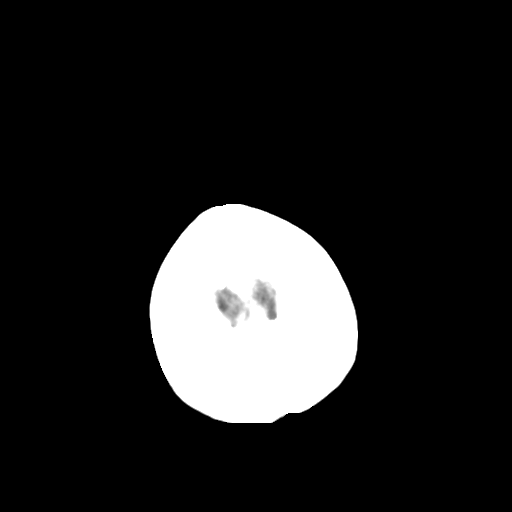
[im 27/30  bone]
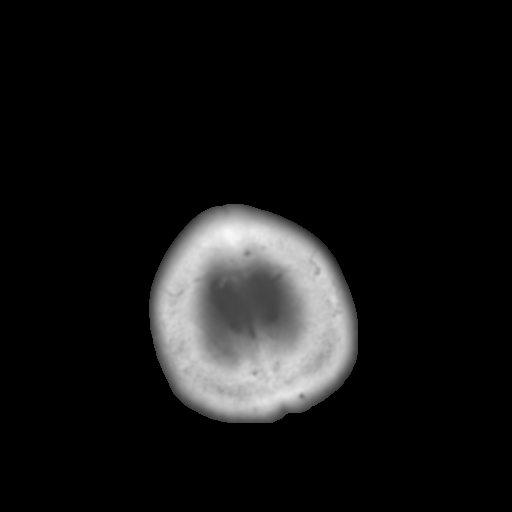

[Series 5: head 3.0 mpr cor · coronal · 0.29mm/px · 3 of 66 slices shown]
[im 22/66  brain]
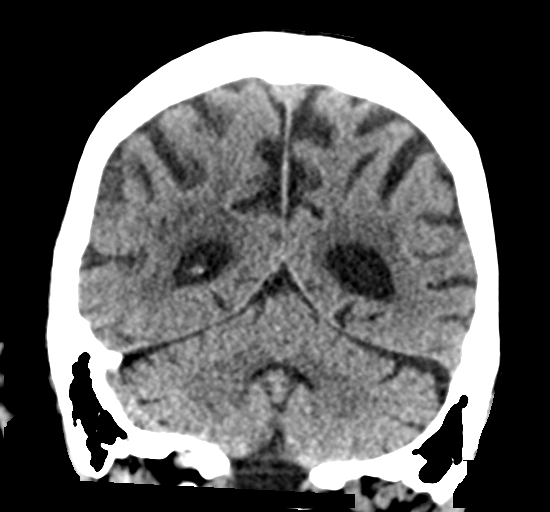
[im 29/66  brain]
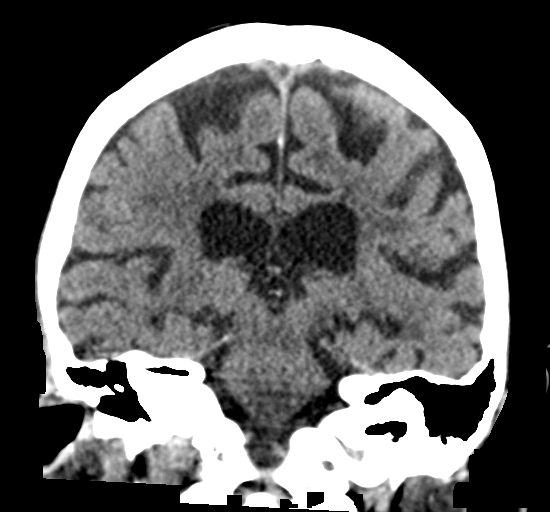
[im 37/66  brain]
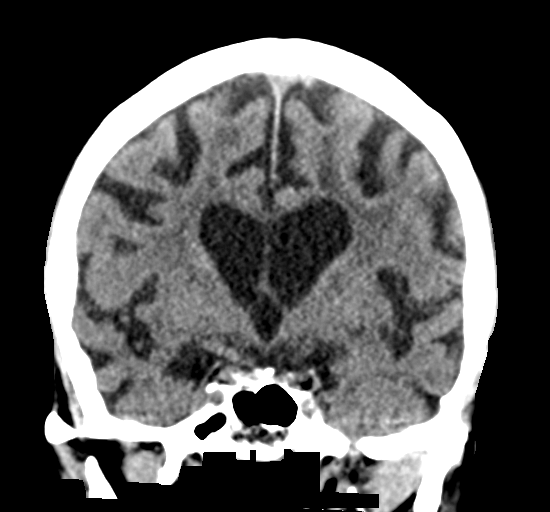

[Series 6: head 3.0 mpr sag · sagittal · 0.29mm/px · 3 of 55 slices shown]
[im 19/55  brain]
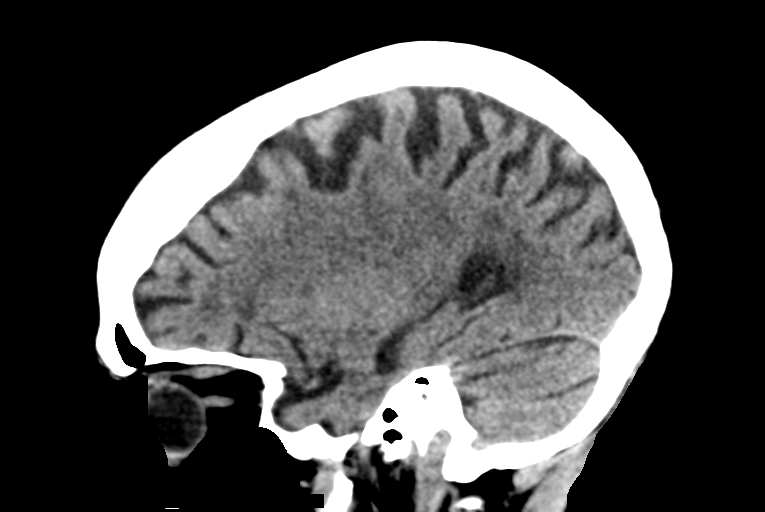
[im 28/55  brain]
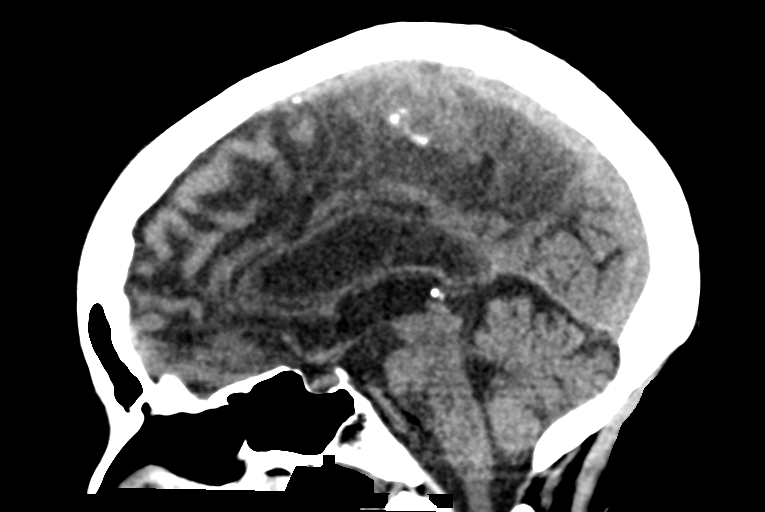
[im 37/55  brain]
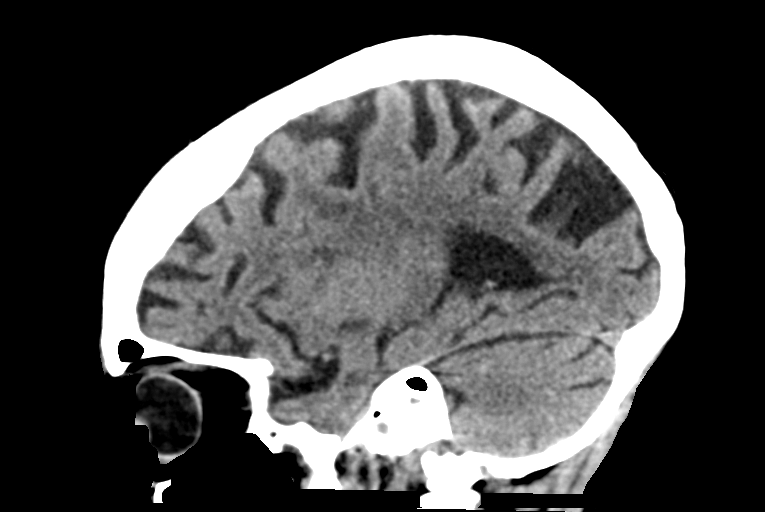

[15 of 47 positions shown; findings below may reference images not displayed]

FINDINGS: Brain: There is mild to moderate severity cerebral atrophy with
widening of the extra-axial spaces and ventricular dilatation.
There are areas of decreased attenuation within the white matter
tracts of the supratentorial brain, consistent with microvascular
disease changes.

Vascular: There is moderate to marked severity calcification of the
bilateral cavernous carotid arteries.

Skull: Normal. Negative for fracture or focal lesion.

Sinuses/Orbits: No acute finding.

Other: None.
IMPRESSION: 1. No acute intracranial abnormality.
2. Mild to moderate severity cerebral atrophy and microvascular
disease changes of the supratentorial brain.

## 2023-04-24 ENCOUNTER — Encounter (HOSPITAL_COMMUNITY): Payer: Self-pay | Admitting: *Deleted

## 2023-04-24 ENCOUNTER — Emergency Department (HOSPITAL_COMMUNITY): Payer: Medicare PPO

## 2023-04-24 ENCOUNTER — Other Ambulatory Visit: Payer: Self-pay

## 2023-04-24 ENCOUNTER — Inpatient Hospital Stay (HOSPITAL_COMMUNITY)
Admission: EM | Admit: 2023-04-24 | Discharge: 2023-05-12 | DRG: 871 | Disposition: A | Discharge status: Home Health | Payer: Medicare PPO | Attending: Internal Medicine | Admitting: Internal Medicine

## 2023-04-24 DIAGNOSIS — R000 Tachycardia, unspecified: Secondary | ICD-10-CM | POA: Diagnosis not present

## 2023-04-24 DIAGNOSIS — N179 Acute kidney failure, unspecified: Secondary | ICD-10-CM | POA: Diagnosis present

## 2023-04-24 DIAGNOSIS — E870 Hyperosmolality and hypernatremia: Secondary | ICD-10-CM | POA: Diagnosis present

## 2023-04-24 DIAGNOSIS — D869 Sarcoidosis, unspecified: Secondary | ICD-10-CM | POA: Diagnosis present

## 2023-04-24 DIAGNOSIS — R630 Anorexia: Secondary | ICD-10-CM | POA: Diagnosis present

## 2023-04-24 DIAGNOSIS — Z7189 Other specified counseling: Secondary | ICD-10-CM | POA: Diagnosis not present

## 2023-04-24 DIAGNOSIS — R531 Weakness: Secondary | ICD-10-CM | POA: Diagnosis not present

## 2023-04-24 DIAGNOSIS — I471 Supraventricular tachycardia, unspecified: Secondary | ICD-10-CM | POA: Diagnosis present

## 2023-04-24 DIAGNOSIS — Z1152 Encounter for screening for COVID-19: Secondary | ICD-10-CM

## 2023-04-24 DIAGNOSIS — E871 Hypo-osmolality and hyponatremia: Secondary | ICD-10-CM | POA: Diagnosis present

## 2023-04-24 DIAGNOSIS — J189 Pneumonia, unspecified organism: Secondary | ICD-10-CM | POA: Diagnosis not present

## 2023-04-24 DIAGNOSIS — D509 Iron deficiency anemia, unspecified: Secondary | ICD-10-CM | POA: Diagnosis present

## 2023-04-24 DIAGNOSIS — L89213 Pressure ulcer of right hip, stage 3: Secondary | ICD-10-CM | POA: Diagnosis present

## 2023-04-24 DIAGNOSIS — E860 Dehydration: Secondary | ICD-10-CM | POA: Diagnosis present

## 2023-04-24 DIAGNOSIS — Z833 Family history of diabetes mellitus: Secondary | ICD-10-CM

## 2023-04-24 DIAGNOSIS — J984 Other disorders of lung: Secondary | ICD-10-CM | POA: Diagnosis not present

## 2023-04-24 DIAGNOSIS — I700 Atherosclerosis of aorta: Secondary | ICD-10-CM | POA: Diagnosis not present

## 2023-04-24 DIAGNOSIS — R4589 Other symptoms and signs involving emotional state: Secondary | ICD-10-CM | POA: Diagnosis not present

## 2023-04-24 DIAGNOSIS — J440 Chronic obstructive pulmonary disease with acute lower respiratory infection: Secondary | ICD-10-CM | POA: Diagnosis present

## 2023-04-24 DIAGNOSIS — R0602 Shortness of breath: Secondary | ICD-10-CM | POA: Diagnosis not present

## 2023-04-24 DIAGNOSIS — L89150 Pressure ulcer of sacral region, unstageable: Secondary | ICD-10-CM | POA: Diagnosis present

## 2023-04-24 DIAGNOSIS — Z87891 Personal history of nicotine dependence: Secondary | ICD-10-CM

## 2023-04-24 DIAGNOSIS — R627 Adult failure to thrive: Secondary | ICD-10-CM | POA: Diagnosis present

## 2023-04-24 DIAGNOSIS — J8410 Pulmonary fibrosis, unspecified: Secondary | ICD-10-CM | POA: Diagnosis not present

## 2023-04-24 DIAGNOSIS — E8770 Fluid overload, unspecified: Secondary | ICD-10-CM | POA: Diagnosis not present

## 2023-04-24 DIAGNOSIS — I10 Essential (primary) hypertension: Secondary | ICD-10-CM | POA: Diagnosis not present

## 2023-04-24 DIAGNOSIS — Z9049 Acquired absence of other specified parts of digestive tract: Secondary | ICD-10-CM | POA: Diagnosis not present

## 2023-04-24 DIAGNOSIS — R64 Cachexia: Secondary | ICD-10-CM | POA: Diagnosis present

## 2023-04-24 DIAGNOSIS — A419 Sepsis, unspecified organism: Secondary | ICD-10-CM | POA: Diagnosis not present

## 2023-04-24 DIAGNOSIS — I4891 Unspecified atrial fibrillation: Secondary | ICD-10-CM | POA: Diagnosis present

## 2023-04-24 DIAGNOSIS — E162 Hypoglycemia, unspecified: Secondary | ICD-10-CM | POA: Diagnosis present

## 2023-04-24 DIAGNOSIS — R509 Fever, unspecified: Secondary | ICD-10-CM | POA: Diagnosis not present

## 2023-04-24 DIAGNOSIS — E785 Hyperlipidemia, unspecified: Secondary | ICD-10-CM | POA: Diagnosis not present

## 2023-04-24 DIAGNOSIS — K5900 Constipation, unspecified: Secondary | ICD-10-CM | POA: Diagnosis not present

## 2023-04-24 DIAGNOSIS — Z515 Encounter for palliative care: Secondary | ICD-10-CM

## 2023-04-24 DIAGNOSIS — M7989 Other specified soft tissue disorders: Secondary | ICD-10-CM | POA: Diagnosis not present

## 2023-04-24 DIAGNOSIS — E876 Hypokalemia: Secondary | ICD-10-CM | POA: Diagnosis present

## 2023-04-24 DIAGNOSIS — R0682 Tachypnea, not elsewhere classified: Secondary | ICD-10-CM | POA: Diagnosis not present

## 2023-04-24 DIAGNOSIS — E538 Deficiency of other specified B group vitamins: Secondary | ICD-10-CM | POA: Diagnosis present

## 2023-04-24 DIAGNOSIS — R0689 Other abnormalities of breathing: Secondary | ICD-10-CM | POA: Diagnosis not present

## 2023-04-24 DIAGNOSIS — Z681 Body mass index (BMI) 19 or less, adult: Secondary | ICD-10-CM

## 2023-04-24 DIAGNOSIS — L89154 Pressure ulcer of sacral region, stage 4: Secondary | ICD-10-CM | POA: Diagnosis not present

## 2023-04-24 DIAGNOSIS — Z66 Do not resuscitate: Secondary | ICD-10-CM | POA: Diagnosis not present

## 2023-04-24 DIAGNOSIS — R918 Other nonspecific abnormal finding of lung field: Secondary | ICD-10-CM | POA: Diagnosis not present

## 2023-04-24 DIAGNOSIS — D539 Nutritional anemia, unspecified: Secondary | ICD-10-CM | POA: Diagnosis present

## 2023-04-24 DIAGNOSIS — G9341 Metabolic encephalopathy: Secondary | ICD-10-CM | POA: Diagnosis not present

## 2023-04-24 DIAGNOSIS — R0902 Hypoxemia: Secondary | ICD-10-CM | POA: Diagnosis not present

## 2023-04-24 DIAGNOSIS — E861 Hypovolemia: Secondary | ICD-10-CM | POA: Diagnosis not present

## 2023-04-24 DIAGNOSIS — R062 Wheezing: Secondary | ICD-10-CM | POA: Diagnosis not present

## 2023-04-24 DIAGNOSIS — D860 Sarcoidosis of lung: Secondary | ICD-10-CM | POA: Diagnosis not present

## 2023-04-24 DIAGNOSIS — F03C0 Unspecified dementia, severe, without behavioral disturbance, psychotic disturbance, mood disturbance, and anxiety: Secondary | ICD-10-CM | POA: Diagnosis not present

## 2023-04-24 DIAGNOSIS — R6521 Severe sepsis with septic shock: Secondary | ICD-10-CM | POA: Diagnosis not present

## 2023-04-24 DIAGNOSIS — L89890 Pressure ulcer of other site, unstageable: Secondary | ICD-10-CM | POA: Diagnosis present

## 2023-04-24 DIAGNOSIS — R4182 Altered mental status, unspecified: Secondary | ICD-10-CM | POA: Diagnosis not present

## 2023-04-24 DIAGNOSIS — E872 Acidosis, unspecified: Secondary | ICD-10-CM | POA: Diagnosis present

## 2023-04-24 DIAGNOSIS — I959 Hypotension, unspecified: Secondary | ICD-10-CM | POA: Diagnosis not present

## 2023-04-24 DIAGNOSIS — Z90710 Acquired absence of both cervix and uterus: Secondary | ICD-10-CM

## 2023-04-24 DIAGNOSIS — Z823 Family history of stroke: Secondary | ICD-10-CM

## 2023-04-24 DIAGNOSIS — Z7401 Bed confinement status: Secondary | ICD-10-CM | POA: Diagnosis not present

## 2023-04-24 DIAGNOSIS — J9601 Acute respiratory failure with hypoxia: Secondary | ICD-10-CM

## 2023-04-24 DIAGNOSIS — L89226 Pressure-induced deep tissue damage of left hip: Secondary | ICD-10-CM | POA: Diagnosis present

## 2023-04-24 DIAGNOSIS — I82411 Acute embolism and thrombosis of right femoral vein: Secondary | ICD-10-CM | POA: Diagnosis present

## 2023-04-24 DIAGNOSIS — J449 Chronic obstructive pulmonary disease, unspecified: Secondary | ICD-10-CM | POA: Diagnosis not present

## 2023-04-24 DIAGNOSIS — Z87442 Personal history of urinary calculi: Secondary | ICD-10-CM

## 2023-04-24 DIAGNOSIS — J690 Pneumonitis due to inhalation of food and vomit: Secondary | ICD-10-CM | POA: Diagnosis not present

## 2023-04-24 DIAGNOSIS — Z8249 Family history of ischemic heart disease and other diseases of the circulatory system: Secondary | ICD-10-CM

## 2023-04-24 DIAGNOSIS — F03918 Unspecified dementia, unspecified severity, with other behavioral disturbance: Secondary | ICD-10-CM | POA: Diagnosis not present

## 2023-04-24 DIAGNOSIS — R579 Shock, unspecified: Secondary | ICD-10-CM | POA: Diagnosis present

## 2023-04-24 LAB — URINALYSIS, W/ REFLEX TO CULTURE (INFECTION SUSPECTED)
Bilirubin Urine: NEGATIVE
Glucose, UA: 50 mg/dL — AB
Ketones, ur: NEGATIVE mg/dL
Leukocytes,Ua: NEGATIVE
Nitrite: NEGATIVE
Protein, ur: NEGATIVE mg/dL
Specific Gravity, Urine: 1.012 (ref 1.005–1.030)
pH: 5 (ref 5.0–8.0)

## 2023-04-24 LAB — CBC WITH DIFFERENTIAL/PLATELET
Abs Immature Granulocytes: 0.1 10*3/uL — ABNORMAL HIGH (ref 0.00–0.07)
Basophils Absolute: 0 10*3/uL (ref 0.0–0.1)
Basophils Relative: 0 %
Eosinophils Absolute: 0 10*3/uL (ref 0.0–0.5)
Eosinophils Relative: 0 %
HCT: 25.8 % — ABNORMAL LOW (ref 36.0–46.0)
Hemoglobin: 7.9 g/dL — ABNORMAL LOW (ref 12.0–15.0)
Immature Granulocytes: 1 %
Lymphocytes Relative: 15 %
Lymphs Abs: 1.1 10*3/uL (ref 0.7–4.0)
MCH: 31.7 pg (ref 26.0–34.0)
MCHC: 30.6 g/dL (ref 30.0–36.0)
MCV: 103.6 fL — ABNORMAL HIGH (ref 80.0–100.0)
Monocytes Absolute: 0.7 10*3/uL (ref 0.1–1.0)
Monocytes Relative: 9 %
Neutro Abs: 5.6 10*3/uL (ref 1.7–7.7)
Neutrophils Relative %: 75 %
Platelets: 132 10*3/uL — ABNORMAL LOW (ref 150–400)
RBC: 2.49 MIL/uL — ABNORMAL LOW (ref 3.87–5.11)
RDW: 17.2 % — ABNORMAL HIGH (ref 11.5–15.5)
Smear Review: NORMAL
WBC: 7.5 10*3/uL (ref 4.0–10.5)
nRBC: 0.7 % — ABNORMAL HIGH (ref 0.0–0.2)

## 2023-04-24 LAB — AMMONIA: Ammonia: 13 umol/L (ref 9–35)

## 2023-04-24 LAB — COMPREHENSIVE METABOLIC PANEL
ALT: 21 U/L (ref 0–44)
AST: 57 U/L — ABNORMAL HIGH (ref 15–41)
Albumin: 2.1 g/dL — ABNORMAL LOW (ref 3.5–5.0)
Alkaline Phosphatase: 26 U/L — ABNORMAL LOW (ref 38–126)
Anion gap: 10 (ref 5–15)
BUN: 59 mg/dL — ABNORMAL HIGH (ref 8–23)
CO2: 24 mmol/L (ref 22–32)
Calcium: 7.5 mg/dL — ABNORMAL LOW (ref 8.9–10.3)
Chloride: 115 mmol/L — ABNORMAL HIGH (ref 98–111)
Creatinine, Ser: 1.72 mg/dL — ABNORMAL HIGH (ref 0.44–1.00)
GFR, Estimated: 31 mL/min — ABNORMAL LOW (ref >60)
Glucose, Bld: 151 mg/dL — ABNORMAL HIGH (ref 70–99)
Potassium: 4.2 mmol/L (ref 3.5–5.1)
Sodium: 149 mmol/L — ABNORMAL HIGH (ref 135–145)
Total Bilirubin: 1.6 mg/dL — ABNORMAL HIGH (ref 0.0–1.2)
Total Protein: 4.8 g/dL — ABNORMAL LOW (ref 6.5–8.1)

## 2023-04-24 LAB — LACTIC ACID, PLASMA
Lactic Acid, Venous: 4.1 mmol/L (ref 0.5–1.9)
Lactic Acid, Venous: 4.3 mmol/L (ref 0.5–1.9)

## 2023-04-24 LAB — BLOOD GAS, ARTERIAL
Acid-Base Excess: 1.1 mmol/L (ref 0.0–2.0)
Bicarbonate: 25.1 mmol/L (ref 20.0–28.0)
FIO2: 100 %
O2 Saturation: 99 %
Patient temperature: 37
pCO2 arterial: 37 mm[Hg] (ref 32–48)
pH, Arterial: 7.44 (ref 7.35–7.45)
pO2, Arterial: 196 mm[Hg] — ABNORMAL HIGH (ref 83–108)

## 2023-04-24 LAB — ABO/RH: ABO/RH(D): B NEG

## 2023-04-24 LAB — I-STAT CG4 LACTIC ACID, ED
Lactic Acid, Venous: 6.5 mmol/L (ref 0.5–1.9)
Lactic Acid, Venous: 10.8 mmol/L (ref 0.5–1.9)

## 2023-04-24 LAB — RESP PANEL BY RT-PCR (RSV, FLU A&B, COVID)  RVPGX2
Influenza A by PCR: NEGATIVE
Influenza B by PCR: NEGATIVE
Resp Syncytial Virus by PCR: NEGATIVE
SARS Coronavirus 2 by RT PCR: NEGATIVE

## 2023-04-24 LAB — PROTIME-INR
INR: 1.4 — ABNORMAL HIGH (ref 0.8–1.2)
Prothrombin Time: 17.1 s — ABNORMAL HIGH (ref 11.4–15.2)

## 2023-04-24 LAB — TSH: TSH: 1.058 u[IU]/mL (ref 0.350–4.500)

## 2023-04-24 LAB — CBG MONITORING, ED: Glucose-Capillary: 125 mg/dL — ABNORMAL HIGH (ref 70–99)

## 2023-04-24 LAB — APTT: aPTT: 22 s — ABNORMAL LOW (ref 24–36)

## 2023-04-24 MED ORDER — PANTOPRAZOLE SODIUM 40 MG IV SOLR
40.0000 mg | INTRAVENOUS | Status: DC
  Administered 2023-04-24 – 2023-04-26 (×3): 40 mg via INTRAVENOUS
  Filled 2023-04-24 (×3): qty 10

## 2023-04-24 MED ORDER — LACTATED RINGERS IV BOLUS (SEPSIS)
2000.0000 mL | Freq: Once | INTRAVENOUS | Status: AC
  Administered 2023-04-24: 2000 mL via INTRAVENOUS

## 2023-04-24 MED ORDER — POLYETHYLENE GLYCOL 3350 17 G PO PACK: 17.0000 g | PACK | Freq: Every day | ORAL | Status: DC | PRN

## 2023-04-24 MED ORDER — SODIUM CHLORIDE 0.9 % IV SOLN
2.0000 g | INTRAVENOUS | Status: DC
  Administered 2023-04-25: 2 g via INTRAVENOUS
  Filled 2023-04-24: qty 12.5

## 2023-04-24 MED ORDER — METRONIDAZOLE 500 MG/100ML IV SOLN
500.0000 mg | Freq: Once | INTRAVENOUS | Status: AC
  Administered 2023-04-24: 500 mg via INTRAVENOUS
  Filled 2023-04-24: qty 100

## 2023-04-24 MED ORDER — VANCOMYCIN HCL IN DEXTROSE 1-5 GM/200ML-% IV SOLN
1000.0000 mg | Freq: Once | INTRAVENOUS | Status: AC
  Administered 2023-04-24: 1000 mg via INTRAVENOUS
  Filled 2023-04-24: qty 200

## 2023-04-24 MED ORDER — LACTATED RINGERS IV BOLUS
1000.0000 mL | Freq: Once | INTRAVENOUS | Status: AC
  Administered 2023-04-24: 1000 mL via INTRAVENOUS

## 2023-04-24 MED ORDER — ALBUMIN HUMAN 5 % IV SOLN
25.0000 g | Freq: Once | INTRAVENOUS | Status: AC
  Administered 2023-04-24: 25 g via INTRAVENOUS
  Filled 2023-04-24: qty 500

## 2023-04-24 MED ORDER — LACTATED RINGERS IV BOLUS
500.0000 mL | Freq: Once | INTRAVENOUS | Status: AC
  Administered 2023-04-24: 500 mL via INTRAVENOUS

## 2023-04-24 MED ORDER — SODIUM CHLORIDE 0.9 % IV SOLN
2.0000 g | Freq: Once | INTRAVENOUS | Status: AC
  Administered 2023-04-24: 2 g via INTRAVENOUS
  Filled 2023-04-24: qty 12.5

## 2023-04-24 MED ORDER — LACTATED RINGERS IV SOLN: INTRAVENOUS | Status: AC

## 2023-04-24 MED ORDER — SODIUM CHLORIDE 0.9 % IV BOLUS
1000.0000 mL | Freq: Once | INTRAVENOUS | Status: AC
  Administered 2023-04-24: 1000 mL via INTRAVENOUS

## 2023-04-24 MED ORDER — DOCUSATE SODIUM 100 MG PO CAPS: 100.0000 mg | ORAL_CAPSULE | Freq: Two times a day (BID) | ORAL | Status: DC | PRN

## 2023-04-24 MED ORDER — ACETAMINOPHEN 650 MG RE SUPP
650.0000 mg | Freq: Once | RECTAL | Status: AC
  Administered 2023-04-24: 650 mg via RECTAL
  Filled 2023-04-24: qty 1

## 2023-04-24 MED ORDER — HEPARIN SODIUM (PORCINE) 5000 UNIT/ML IJ SOLN
5000.0000 [IU] | Freq: Two times a day (BID) | INTRAMUSCULAR | Status: DC
  Administered 2023-04-24 (×2): 5000 [IU] via SUBCUTANEOUS
  Filled 2023-04-24 (×2): qty 1

## 2023-04-24 NOTE — ED Notes (Signed)
New fresh rainbow sent to lab. Lab notified via phone. Pt mildly restless/ fidgety. No significant changes. Daughter at [facility]. IVF bolus initiated for low BP.

## 2023-04-24 NOTE — Progress Notes (Signed)
Pharmacy Antibiotic Note  Patricia Davies is a 78 y.o. female admitted on 04/24/2023 with decline for several days prior to admission. Patient hypotensive on arrival. Code sepsis. Pharmacy has been consulted for cefepime dosing.  Plan: -Cefepime 2 g IV q24h  Weight: 32.7 kg (72 lb)  Temp (24hrs), Avg:98.1 F (36.7 C), Min:96.2 F (35.7 C), Max:100 F (37.8 C)  Recent Labs  Lab 04/24/23 1052 04/24/23 1058 04/24/23 1250 04/24/23 1524  WBC  --   --  7.5  --   CREATININE 1.72*  --   --   --   LATICACIDVEN  --  6.5*  --  10.8*    CrCl cannot be calculated (Unknown ideal weight.).    No Known Allergies  Antimicrobials this admission: Cefepime 2/20 >> Vancomycin/flagyl 2/20 x 1  Dose adjustments this admission: NA  Microbiology results: 2/20 BCx: pending   Thank you for allowing pharmacy to be a part of this patient's care.  Pricilla Riffle, PharmD, BCPS Clinical Pharmacist 04/24/2023 3:51 PM

## 2023-04-24 NOTE — ED Notes (Addendum)
IVF bolus complete. ABT x3 infusing. VSS. Unable to obtain consistent SPO2. Pt restless, monaing. NRB continues.

## 2023-04-24 NOTE — ED Notes (Addendum)
Lab contacted, ED requesting phlebotomy to draw blood on pt after multiple tubes, after multiple attempts and sticks rejected buy lab. [place] into room, at [facility]. IVF bolus infusing.

## 2023-04-24 NOTE — H&P (Addendum)
NAME:  Patricia Davies, MRN:  [id number], DOB:  05/26/1945, LOS: 0 ADMISSION DATE:  04/24/2023, CONSULTATION DATE:  2/20 REFERRING MD:  Dr. Maple Hudson EDP , CHIEF COMPLAINT: Encephalopathy  History of Present Illness:  78 year old female with past medical history as below, which is significant for COPD, hypertension, sarcoidosis, and dementia.  The patient is unable to contribute to history but daughter is here at bedside and providing the necessary information.  Daughter reports that the patient remotely has a history of alcohol abuse.  More recently as she has struggled with dementia for approximately the last 7 to 8 years.  She has been living with her daughter for the past 3 years.  As is, with the disease she has had a progressive decline during that time.  Most notably her appetite and willingness to eat or drink has really disappeared in the last 3 months.  Her daughter [name] has been trying very hard to encourage her to eat and drink, however, if she seems to have little interest or ability to do so.  She is experienced quite a bit of weight loss.  She presented to [facility] emergency department on 2/20 with obtundation.  [name] reports the patient has been very sleepy in the week leading up to presentation and when she was unable to talk in the morning of 2/20 is when she decided to bring her to the emergency department via EMS.  Upon arrival to the emergency department she was noted to be hypotensive, tachycardic, and obtunded.  She was provided with IV fluids which did improve her blood pressure.  She remained quite encephalopathic and with concern for airway PCCM was asked to evaluate.  Pertinent  Medical History   has a past medical history of COPD (chronic obstructive pulmonary disease) (HCC), History of nephrolithiasis, Hypertension, Sarcoidosis of skin, and Vitamin B12 deficiency.   Significant Hospital Events: Including procedures, antibiotic start and stop dates in addition to other  pertinent events     Interim History / Subjective:    Objective   Blood pressure 105/65, pulse 88, temperature 100 F (37.8 C), temperature source Rectal, resp. rate 18, weight 32.7 kg, SpO2 (!) 87%.        Intake/Output Summary (Last 24 hours) at 04/24/2023 1515 Last data filed at 04/24/2023 1400 Gross per 24 hour  Intake 5700 ml  Output --  Net 5700 ml   Filed Weights   04/24/23 1046  Weight: 32.7 kg    Examination: General: Frail elderly female HENT: [place]/AT, PERRL, no JVD Lungs: Clear bilateral breath sounds Cardiovascular: RRR, no MRG Abdomen: Soft, NT, ND Extremities: No acute deformity Neuro: Somnolent, will briefly arouse and make non-purposeful movements. Seems to be protecting airway and will clear throat.    Resolved Hospital Problem list     Assessment & Plan:   Shock: seems to predominantly be hypovolemic secondary to dehydration and poor oral intake. Cannot rule out sepsis. Concern for aspiration, but LUL opacification is chronic to some degree although it does look worse today compared to 4 years prior. Patient with history of sarcoidosis. Improved with IVF - Admit to ICU for hemodynamic monitoring - Ongoing IVF resuscitation - No escalation to vasopressors per discussion with daughter  Acute metabolic encephalopathy: multifactorial in the setting of hypotension, uremia, possible sepsis on baseline of advanced dementia.  - Hemodynamic support - Empiric antibiotics - Check TSH, Ammonia  Possible aspiration pneumonia: Sarcoidosis - Empiric cefepime until we can rule out other sources of infection - Supplemental  oxygen  - O2 sat probe not picking up well, PO2 196 with sat 99% on ABG  AKI Hypernatremia - hemodynamic support - Trend BMP  Anemia: chronicity uncertain - hold off transfusion for now - Trend H&H - Transfuse per ICU guidelines.   Failure to thrive - supportive care  Goals of care: Discussed with [name] (daughter) understands  demential in irreversible and is at the root of her mother's condition. Agrees aggressive life prolonging measures like CPR, intubation, central lines, and vasopressors will induce pain without hope of improving the primary issue. DNR, DNI, no pressors. Will try to treat the treatable such as hypovolemia and infection and hope she can improve. Otherwise will likely pursue hospice or inpatient comfort care.   Best Practice (right click and "Reselect all SmartList Selections" daily)   Diet/type: NPO DVT prophylaxis prophylactic heparin  Pressure ulcer(s):  GI prophylaxis: PPI Lines: N/A Foley:  N/A Code Status:  DNR Last date of multidisciplinary goals of care discussion [ ]   Labs   CBC: Recent Labs  Lab 04/24/23 1250  WBC 7.5  NEUTROABS 5.6  HGB 7.9*  HCT 25.8*  MCV 103.6*  PLT 132*    Basic Metabolic Panel: Recent Labs  Lab 04/24/23 1052  NA 149*  K 4.2  CL 115*  CO2 24  GLUCOSE 151*  BUN 59*  CREATININE 1.72*  CALCIUM 7.5*   GFR: CrCl cannot be calculated (Unknown ideal weight.). Recent Labs  Lab 04/24/23 1058 04/24/23 1250  WBC  --  7.5  LATICACIDVEN 6.5*  --     Liver Function Tests: Recent Labs  Lab 04/24/23 1052  AST 57*  ALT 21  ALKPHOS 26*  BILITOT 1.6*  PROT 4.8*  ALBUMIN 2.1*   No results for input(s): "LIPASE", "AMYLASE" in the last 168 hours. No results for input(s): "AMMONIA" in the last 168 hours.  ABG    Component Value Date/Time   PHART 7.44 04/24/2023 1120   PCO2ART 37 04/24/2023 1120   PO2ART 196 (H) 04/24/2023 1120   HCO3 25.1 04/24/2023 1120   TCO2 28 01/30/2011 1404   O2SAT 99 04/24/2023 1120     Coagulation Profile: Recent Labs  Lab 04/24/23 1250  INR 1.4*    Cardiac Enzymes: No results for input(s): "CKTOTAL", "CKMB", "CKMBINDEX", "TROPONINI" in the last 168 hours.  HbA1C: Hgb A1c MFr Bld  Date/Time Value Ref Range Status  06/01/2015 09:09 AM 4.9 4.6 - 6.5 % Final    Comment:    Glycemic Control Guidelines  for People with Diabetes:Non Diabetic:  <6%Goal of Therapy: <7%Additional Action Suggested:  >8%   01/16/2011 04:15 PM 5.0 4.6 - 6.5 % Final    Comment:    Glycemic Control Guidelines for People with Diabetes:Non Diabetic:  <6%Goal of Therapy: <7%Additional Action Suggested:  >8%     CBG: Recent Labs  Lab 04/24/23 1031  GLUCAP 125*    Review of Systems:   Patient is encephalopathic and/or intubated; therefore, history has been obtained from chart review.    Past Medical History:  She,  has a past medical history of COPD (chronic obstructive pulmonary disease) (HCC), History of nephrolithiasis, Hypertension, Sarcoidosis of skin, and Vitamin B12 deficiency.   Surgical History:   Past Surgical History:  Procedure Laterality Date   ABDOMINAL HYSTERECTOMY     CHOLECYSTECTOMY       Social History:   reports that she quit smoking about 33 years ago. Her smoking use included cigarettes. She started smoking about 53 years ago. She  has a 10 pack-year smoking history. She has never used smokeless tobacco. She reports that she does not currently use alcohol after a past usage of about 1.0 standard drink of alcohol per week. She reports that she does not use drugs.   Family History:  Her family history includes Diabetes in her maternal aunt; Hypertension in her maternal aunt.   Allergies No Known Allergies   Home Medications  Prior to Admission medications   Medication Sig Start Date End Date Taking? Authorizing Provider  acetaminophen (TYLENOL) 325 MG tablet Take 650 mg by mouth every 6 (six) hours as needed.   Yes [provider]  ibuprofen (ADVIL) 200 MG tablet Take 400 mg by mouth every 6 (six) hours as needed for headache or mild pain.   Yes [provider]  memantine (NAMENDA) 10 MG tablet Take 1 tablet (10 mg at night) for 2 weeks, then increase to 1 tablet (10 mg) twice a day Patient taking differently: Take 10 mg by mouth See admin instructions. Take 1 tablet  (10 mg at night) for 2 weeks, then increase to 1 tablet (10 mg) twice a day 09/07/21  Yes Marcos Eke, PA-C     Critical care time:      Joneen Roach, AGACNP-BC [facility]  See Amion for personal pager PCCM on call pager [phone] until 7pm. Please call Elink 7p-7a. [phone]  04/24/2023 4:03 PM

## 2023-04-24 NOTE — ED Notes (Signed)
 Xray at [facility]

## 2023-04-24 NOTE — ED Provider Notes (Signed)
[place] EMERGENCY DEPARTMENT AT [facility] Provider Note   CSN: [id number] Arrival date & time: 04/24/23  1020     History  Chief Complaint  Patient presents with   Code Sepsis    Patricia Davies is a 78 y.o. female.  This is a 78 year old female presenting emergency department from home with altered mental status.  EMS reported the patient with decline over the past 3 days.  Patient is obtunded and cannot provide any meaningful history, she will withdraw all extremities to painful stimuli.  EMS reported hypotension blood pressure in the mid 50s.  Gave 250 normal saline and started epi drip.  On arrival        Home Medications Prior to Admission medications   Medication Sig Start Date End Date Taking? Authorizing Provider  acetaminophen (TYLENOL) 325 MG tablet Take 650 mg by mouth every 6 (six) hours as needed.   Yes [provider]  ibuprofen (ADVIL) 200 MG tablet Take 400 mg by mouth every 6 (six) hours as needed for headache or mild pain.   Yes [provider]  memantine (NAMENDA) 10 MG tablet Take 1 tablet (10 mg at night) for 2 weeks, then increase to 1 tablet (10 mg) twice a day Patient taking differently: Take 10 mg by mouth See admin instructions. Take 1 tablet (10 mg at night) for 2 weeks, then increase to 1 tablet (10 mg) twice a day 09/07/21  Yes Marcos Eke, PA-C      Allergies    Patient has no known allergies.    Review of Systems   Review of Systems  Physical Exam Updated Vital Signs BP 105/65   Pulse 88   Temp 100 F (37.8 C) (Rectal)   Resp 18   Wt 32.7 kg   SpO2 (!) 87%   BMI 13.60 kg/m  Physical Exam Vitals and nursing note reviewed.  Constitutional:      General: She is in acute distress.     Appearance: She is ill-appearing.     Comments: Cachectic  HENT:     Head: Normocephalic and atraumatic.     Mouth/Throat:     Mouth: Mucous membranes are dry.  Eyes:     Pupils: Pupils are equal, round, and reactive  to light.  Cardiovascular:     Rate and Rhythm: Regular rhythm. Tachycardia present.  Pulmonary:     Comments: Patient is tachypneic, moderate respiratory distress.  Diffuse crackles at all lung fields. Abdominal:     General: Abdomen is flat. There is no distension.     Palpations: Abdomen is soft.     Tenderness: There is no abdominal tenderness. There is no guarding or rebound.  Genitourinary:    Comments: Stage III sacral decubitus ulcer. Musculoskeletal:     Right lower leg: No edema.     Left lower leg: No edema.  Neurological:     Comments: Difficult to assess due to patient's current mentation.  Will withdraw all extremities to pain.     ED Results / Procedures / Treatments   Labs (all labs ordered are listed, but only abnormal results are displayed) Labs Reviewed  COMPREHENSIVE METABOLIC PANEL - Abnormal; Notable for the following components:      Result Value   Sodium 149 (*)    Chloride 115 (*)    Glucose, Bld 151 (*)    BUN 59 (*)    Creatinine, Ser 1.72 (*)    Calcium 7.5 (*)    Total  Protein 4.8 (*)    Albumin 2.1 (*)    AST 57 (*)    Alkaline Phosphatase 26 (*)    Total Bilirubin 1.6 (*)    GFR, Estimated 31 (*)    All other components within normal limits  BLOOD GAS, ARTERIAL - Abnormal; Notable for the following components:   pO2, Arterial 196 (*)    All other components within normal limits  CBC WITH DIFFERENTIAL/PLATELET - Abnormal; Notable for the following components:   RBC 2.49 (*)    Hemoglobin 7.9 (*)    HCT 25.8 (*)    MCV 103.6 (*)    RDW 17.2 (*)    Platelets 132 (*)    nRBC 0.7 (*)    Abs Immature Granulocytes 0.10 (*)    All other components within normal limits  PROTIME-INR - Abnormal; Notable for the following components:   Prothrombin Time 17.1 (*)    INR 1.4 (*)    All other components within normal limits  APTT - Abnormal; Notable for the following components:   aPTT 22 (*)    All other components within normal limits  CBG  MONITORING, ED - Abnormal; Notable for the following components:   Glucose-Capillary 125 (*)    All other components within normal limits  I-STAT CG4 LACTIC ACID, ED - Abnormal; Notable for the following components:   Lactic Acid, Venous 6.5 (*)    All other components within normal limits  RESP PANEL BY RT-PCR (RSV, FLU A&B, COVID)  [id number]  CULTURE, BLOOD (ROUTINE X 2)  CULTURE, BLOOD (ROUTINE X 2)  CBC WITH DIFFERENTIAL/PLATELET  URINALYSIS, W/ REFLEX TO CULTURE (INFECTION SUSPECTED)  CBC WITH DIFFERENTIAL/PLATELET  I-STAT CG4 LACTIC ACID, ED    EKG EKG Interpretation Date/Time:  Thursday April 24 2023 11:40:37 EST Ventricular Rate:  98 PR Interval:    QRS Duration:  95 QT Interval:  420 QTC Calculation: 537 R Axis:   85  Text Interpretation: Atrial fibrillation Borderline right axis deviation Borderline low voltage, extremity leads Prolonged QT interval Confirmed by Estanislado Pandy [phone]) on 04/24/2023 12:46:46 PM  Radiology US Abdomen Limited RUQ (LIVER/GB) Result Date: 04/24/2023 CLINICAL DATA:  Sepsis. EXAM: ULTRASOUND ABDOMEN LIMITED RIGHT UPPER QUADRANT COMPARISON:  August 28, 2022. FINDINGS: Gallbladder: Status post cholecystectomy. Common bile duct: Diameter: 4 mm which is within normal limits. Liver: No focal lesion identified. Within normal limits in parenchymal echogenicity. Portal vein is patent on color Doppler imaging with normal direction of blood flow towards the liver. Other: None. IMPRESSION: No acute abnormality seen in the right upper quadrant of the abdomen. Electronically Signed   By: Lupita Raider M.D.   On: 04/24/2023 14:04   CT Head Wo Contrast Result Date: 04/24/2023 CLINICAL DATA:  Mental status change, unknown cause EXAM: CT HEAD WITHOUT CONTRAST TECHNIQUE: Contiguous axial images were obtained from the base of the skull through the vertex without intravenous contrast. RADIATION DOSE REDUCTION: This exam was performed according to the departmental  dose-optimization program which includes automated exposure control, adjustment of the mA and/or kV according to patient size and/or use of iterative reconstruction technique. COMPARISON:  CT head April 20, 23. FINDINGS: Motion limited study.  Within this limitation: Brain: No evidence of acute infarction, hemorrhage, hydrocephalus, extra-axial collection or mass lesion/mass effect. Patchy white matter hypodensities, nonspecific but compatible with chronic microvascular disease. Cerebral atrophy. Vascular: No hyperdense vessel identified. Calcific atherosclerosis. Skull: No acute fracture. Sinuses/Orbits: Clear sinuses.  No acute orbital findings. Other: No mastoid effusions. IMPRESSION: 1.  No evidence of acute intracranial abnormality. 2.  Cerebral Atrophy (ICD10-G31.9). Electronically Signed   By: Feliberto Harts M.D.   On: 04/24/2023 12:42   DG Chest Portable 1 View Result Date: 04/24/2023 CLINICAL DATA:  Questionable sepsis. EXAM: PORTABLE CHEST 1 VIEW COMPARISON:  Radiographs 07/22/2019 and 05/06/2017.  CT 08/08/2016. FINDINGS: 1145 hours. The heart size and mediastinal contours are grossly stable. There is severe chronic lung disease with biapical scarring and superior hilar retraction bilaterally. Interval increased density at the left lung apex which could reflect progressive scarring, superimposed infection or developing mass. No significant change identified within the right hemithorax. There is no pleural effusion or pneumothorax. The bones appear unchanged. IMPRESSION: 1. Increased left apical opacity and volume loss compared with prior studies from 4 years ago. Findings could be secondary to progressive scarring, superimposed infection or developing mass. Correlate clinically. CT may be helpful for further evaluation. 2. Otherwise grossly stable chronic lung disease attributed to underlying sarcoidosis. Electronically Signed   By: Carey Bullocks M.D.   On: 04/24/2023 12:19     Procedures .Critical Care  Performed by: Coral Spikes, DO Authorized by: Coral Spikes, DO   Critical care provider statement:    Critical care time (minutes):  30   Critical care time was exclusive of:  Separately billable procedures and treating other patients   Critical care was necessary to treat or prevent imminent or life-threatening deterioration of the following conditions:  Shock and sepsis   Critical care was time spent personally by me on the following activities:  Development of treatment plan with patient or surrogate, discussions with consultants, evaluation of patient's response to treatment, examination of patient, ordering and review of laboratory studies, ordering and review of radiographic studies, ordering and performing treatments and interventions, pulse oximetry, re-evaluation of patient's condition and review of old charts   Care discussed with: admitting provider       Medications Ordered in ED Medications  lactated ringers infusion ( Intravenous New Bag/Given 04/24/23 1122)  docusate sodium (COLACE) capsule 100 mg (has no administration in time range)  polyethylene glycol (MIRALAX / GLYCOLAX) packet 17 g (has no administration in time range)  heparin injection 5,000 Units (5,000 Units Subcutaneous Given 04/24/23 1500)  lactated ringers bolus 2,000 mL (0 mLs Intravenous Stopped 04/24/23 1106)  ceFEPIme (MAXIPIME) 2 g in sodium chloride 0.9 % 100 mL IVPB (0 g Intravenous Stopped 04/24/23 1124)  metroNIDAZOLE (FLAGYL) IVPB 500 mg (0 mg Intravenous Stopped 04/24/23 1251)  vancomycin (VANCOCIN) IVPB 1000 mg/200 mL premix (0 mg Intravenous Stopped 04/24/23 1251)  sodium chloride 0.9 % bolus 1,000 mL (0 mLs Intravenous Stopped 04/24/23 1103)  acetaminophen (TYLENOL) suppository 650 mg (650 mg Rectal Given 04/24/23 1500)  lactated ringers bolus 500 mL (0 mLs Intravenous Stopped 04/24/23 1325)  lactated ringers bolus 1,000 mL (0 mLs Intravenous Stopped 04/24/23 1400)     ED Course/ Medical Decision Making/ A&P Clinical Course as of 04/24/23 1503  Thu Apr 24, 2023  1115 Patient's current blood pressure 98/85.  Daughter presented to bedside.  After a thorough discussion of goals of care, would like to make mother DNR.  She would continue for treatment otherwise. [TY]  1157 DG Chest Portable 1 View C/w multifocal pneumonia on my independent interpretation.  sHe is receiving broad-spectrum antibiotics. [TY]  1219 Comprehensive metabolic panel(!) Consistent with dehydration.  AKI noted. [TY]  1239 Bp 91/66 [TY]  1245 CT Head Wo Contrast IMPRESSION: 1. No evidence of acute intracranial abnormality. 2.  Cerebral Atrophy (ICD10-G31.9).   [TY]  1245 Resp panel by RT-PCR (RSV, Flu A&B, Covid) Anterior Nasal Swab negative [TY]  1245 pO2, Arterial(!): 196 Will wean off non-rebreather [TY]  1246 Lactic Acid, Venous(!!): 6.5 2/2 hypotension/sepsis. BP improving with IVFs.  [TY]  1307 Consulted with ICU will come see patient. [TY]    Clinical Course User Index [TY] Coral Spikes, DO                                 Medical Decision Making This is a 78 year old female with advanced dementia with failure to thrive presenting the emergency department for altered mental status.  Septic on arrival with hypotension and tachycardia.  Dry on exam.  With nearly collapsed IVC.  Broad-spectrum antibiotics ordered and IV fluids.  Simile fluid responsive with improvement of her blood pressure into the 90s.  Appears to have pneumonia on chest x-ray.  Does have an elevated lactate.  New anemia.  AKI.  Case discussed with hospitalist who agrees to see admit patient.  Amount and/or Complexity of Data Reviewed Independent Historian:     Details: Daughter provided most of history as patient is obtunded. External Data Reviewed:     Details: Per chart review is being seen by palliative care for failure to thrive. Labs: ordered. Decision-making details documented in ED  Course. Radiology: ordered. Decision-making details documented in ED Course.  Risk OTC drugs. Prescription drug management. Decision regarding hospitalization. Decision not to resuscitate or to de-escalate care because of poor prognosis. Risk Details: Goals of care with daughter; made DNR. See ED course.            Final Clinical Impression(s) / ED Diagnoses Final diagnoses:  Sepsis, due to unspecified organism, unspecified whether acute organ dysfunction present [facility])    Rx / DC Orders ED Discharge Orders     None         Coral Spikes, DO 04/24/23 1503

## 2023-04-24 NOTE — ED Notes (Signed)
Lactic acid resulted 10.76. Dr. Maple Hudson and Cletis Athens RN aware.

## 2023-04-24 NOTE — ED Triage Notes (Signed)
BIB [facility] from home, lives with daughter, here for AMS, decreased LOC, h/o dementia, mentions FTT and decline over last year, marked difference and change in last 2days, reports tachypneic, LS with rhonchi and crackles, unable to obtain SPO2, GCS 6, HR 120, ETCO2 11, BP initially 50/60, improved to 110/80 with IVF and epi gtt, NSL 18g R FA, and L AC. EKKG ST. CBG 193. Daughter following. EDP into room upon arrival. H/o lung pathology, but "not COPD or bronchitis". Pt obtunded on arrival.

## 2023-04-24 NOTE — ED Notes (Signed)
EDP at [facility] speaking with daughter at length re: dx, status, tx options, plan, code status, meds. RT at [facility] attempting ABG.

## 2023-04-24 NOTE — Sepsis Progress Note (Signed)
 Elink will follow per sepsis protocol.

## 2023-04-25 ENCOUNTER — Inpatient Hospital Stay (HOSPITAL_COMMUNITY): Payer: Medicare PPO

## 2023-04-25 DIAGNOSIS — R579 Shock, unspecified: Secondary | ICD-10-CM

## 2023-04-25 LAB — CBC
HCT: 20.7 % — ABNORMAL LOW (ref 36.0–46.0)
Hemoglobin: 6.5 g/dL — CL (ref 12.0–15.0)
MCH: 31.6 pg (ref 26.0–34.0)
MCHC: 31.4 g/dL (ref 30.0–36.0)
MCV: 100.5 fL — ABNORMAL HIGH (ref 80.0–100.0)
Platelets: 119 10*3/uL — ABNORMAL LOW (ref 150–400)
RBC: 2.06 MIL/uL — ABNORMAL LOW (ref 3.87–5.11)
RDW: 16.8 % — ABNORMAL HIGH (ref 11.5–15.5)
WBC: 8 10*3/uL (ref 4.0–10.5)
nRBC: 0.5 % — ABNORMAL HIGH (ref 0.0–0.2)

## 2023-04-25 LAB — VITAMIN B12: Vitamin B-12: 596 pg/mL (ref 180–914)

## 2023-04-25 LAB — BASIC METABOLIC PANEL
Anion gap: 13 (ref 5–15)
BUN: 44 mg/dL — ABNORMAL HIGH (ref 8–23)
CO2: 26 mmol/L (ref 22–32)
Calcium: 7.8 mg/dL — ABNORMAL LOW (ref 8.9–10.3)
Chloride: 106 mmol/L (ref 98–111)
Creatinine, Ser: 0.82 mg/dL (ref 0.44–1.00)
GFR, Estimated: >60 mL/min (ref >60)
Glucose, Bld: 100 mg/dL — ABNORMAL HIGH (ref 70–99)
Potassium: 3.1 mmol/L — ABNORMAL LOW (ref 3.5–5.1)
Sodium: 145 mmol/L (ref 135–145)

## 2023-04-25 LAB — HEMOGLOBIN AND HEMATOCRIT, BLOOD
HCT: 32.1 % — ABNORMAL LOW (ref 36.0–46.0)
Hemoglobin: 10 g/dL — ABNORMAL LOW (ref 12.0–15.0)

## 2023-04-25 LAB — FOLATE: Folate: 3.2 ng/mL — ABNORMAL LOW (ref >5.9)

## 2023-04-25 LAB — MAGNESIUM: Magnesium: 2.1 mg/dL (ref 1.7–2.4)

## 2023-04-25 LAB — PREPARE RBC (CROSSMATCH)

## 2023-04-25 LAB — MRSA NEXT GEN BY PCR, NASAL: MRSA by PCR Next Gen: NOT DETECTED

## 2023-04-25 LAB — PHOSPHORUS: Phosphorus: 2 mg/dL — ABNORMAL LOW (ref 2.5–4.6)

## 2023-04-25 MED ORDER — POTASSIUM CHLORIDE 10 MEQ/100ML IV SOLN
10.0000 meq | INTRAVENOUS | Status: AC
  Administered 2023-04-25 (×4): 10 meq via INTRAVENOUS
  Filled 2023-04-25 (×4): qty 100

## 2023-04-25 MED ORDER — MEDIHONEY WOUND/BURN DRESSING EX PSTE
1.0000 | PASTE | Freq: Every day | CUTANEOUS | Status: DC
  Administered 2023-04-25 – 2023-05-11 (×17): 1 via TOPICAL
  Filled 2023-04-25 (×2): qty 44

## 2023-04-25 MED ORDER — SODIUM CHLORIDE 0.9% IV SOLUTION: Freq: Once | INTRAVENOUS | Status: AC

## 2023-04-25 MED ORDER — ORAL CARE MOUTH RINSE: 15.0000 mL | OROMUCOSAL | Status: DC | PRN

## 2023-04-25 MED ORDER — CHLORHEXIDINE GLUCONATE CLOTH 2 % EX PADS
6.0000 | MEDICATED_PAD | Freq: Every day | CUTANEOUS | Status: DC
  Administered 2023-04-25 – 2023-05-01 (×7): 6 via TOPICAL

## 2023-04-25 MED ORDER — POTASSIUM PHOSPHATES 15 MMOLE/5ML IV SOLN
30.0000 mmol | Freq: Once | INTRAVENOUS | Status: AC
  Administered 2023-04-25: 30 mmol via INTRAVENOUS
  Filled 2023-04-25: qty 10

## 2023-04-25 MED ORDER — MEDIHONEY WOUND/BURN DRESSING EX PSTE
1.0000 | PASTE | Freq: Every day | CUTANEOUS | Status: DC
  Filled 2023-04-25: qty 44

## 2023-04-25 MED ORDER — AMPICILLIN-SULBACTAM SODIUM 1.5 (1-0.5) G IJ SOLR
1.5000 g | Freq: Three times a day (TID) | INTRAMUSCULAR | Status: AC
  Administered 2023-04-25 – 2023-05-01 (×19): 1.5 g via INTRAVENOUS
  Filled 2023-04-25 (×19): qty 4

## 2023-04-25 NOTE — Plan of Care (Signed)
   Problem: Education: Goal: Knowledge of General Education information will improve Description: Including pain rating scale, medication(s)/side effects and non-pharmacologic comfort measures Outcome: Not Progressing   Problem: Health Behavior/Discharge Planning: Goal: Ability to manage health-related needs will improve Outcome: Not Progressing

## 2023-04-25 NOTE — Consult Note (Addendum)
WOC Nurse Consult Note: Reason for Consult: Consult requested for wounds.  Performed remotely after review of progress notes and photos in the EMR.  These pressure injuries are consistent with end of life skin failure and Pt has poor potential for healing.   Pt is critically ill with multiple systemic factors which can impair healing.  She could benefit from a surgical consult for possible debridement of Unstageable pressure injury to sacrum if aggressive plan of care is desired, however, according to progress notes Pt is a DNR and no escalation of care is desired.   Sacrum wound is 100% tightly adhered eschar; Unstageable pressure injury; 5X6cm, according to bedside nurses' wound care flow sheet Left hip is dark red-purple Deep tissue pressure injury; 6X5cm, according to bedside nurses' wound care flow sheet Right hip is Stage 3 pressure injury; 50% red, 30% yellow, 20% dark red-purple Deep tissue pressure injury to wound edges, 3.8X1.9cm, according to bedside nurses' wound care flow sheet; .  Pressure Injury POA: Yes Dressing procedure/placement/frequency: Pt is on a low airloss mattress to reduce pressure.  Topical treatemt orders provided for bedside nurses to perform as follows to assist with removal of nonviable tissue:  1. Apply Medihoney to sacrum and right hip wounds Q day then cover with foam dressing.  Change foam dressing Q 3 days or PRN soiling 2. Apply Xeroform gauze to left hip wound Q day and cover with foam dressing.  Change foam dressing Q 3 days or PRN soiling. Please re-consult if further assistance is needed.  Thank-you,  Cammie Mcgee MSN, RN, CWOCN, [place], CNS [phone]

## 2023-04-25 NOTE — Progress Notes (Signed)
PROGRESS NOTE    Patricia Davies  ZOX:[id number] DOB: [birth year] DOA: 04/24/2023 PCP: [name], MD   Brief Narrative: TRH pickup 04/25/2023 Admitted by PCCM on 04/24/2023 This is a 78 year old  female lives at home with her daughter for the last 3 years due to advanced dementia.  She has a past medical history significant for sarcoidosis COPD hypertension, kidney stones B12 deficiency and dementia.  She has been struggling with dementia for the past 7 to 8 years.  Patient had gradually progressive decline in the past 3 years.  Patient has very poor appetite does not eat or drink and has had significant weight loss.  On arrival to the emergency room she was obtunded hypotensive tachycardic  On admission her sodium was 149 BUN was 59 creatinine 1.72, albumin 2.1, lactic acid 6.5, hemoglobin 7.9, platelets 132 COVID RSV and flu were negative. ABG 7. 4/37/196 Chest x-ray -  Increased left apical opacity and volume loss compared with prior studies from 4 years ago. Findings could be secondary to progressive scarring, superimposed infection or developing mass. Correlate clinically. CT may be helpful for further evaluation.Otherwise grossly stable chronic lung disease attributed to underlying sarcoidosis. CT head showed cerebral atrophy and no evidence of acute intracranial abnormality  Assessment & Plan:   Principal Problem:   Shock (HCC) Active Problems:   Severe dementia without behavioral disturbance, psychotic disturbance, mood disturbance, or anxiety (HCC)   Hypovolemia   Failure to thrive in adult   #1 acute metabolic encephalopathy likely secondary to multifactorial issues in the setting of severe dementia, pneumonia, shock, dehydration. Continue supportive measures as below  #2 acute on chronic macrocytic anemia-hemoglobin 6.5 down from 7.9 Partly due to hemodilution from all the fluids that she has received so far. She is positive over 8 L Discussed with daughter will  transfuse 1 unit of packed RBC Will DC subcu heparin She has a history of B12 deficiency I do not see B12 supplementation and outpatient home medications.  Will add a B12 folate level to today's labs.  #3 AKI and hyponatremia resolved with IV fluids secondary to dehydration poor p.o. intake  #4 shock she presented obtunded initial blood pressure with a EMS was 50/60 responded to IV fluids somewhat still a blood pressure in the soft range  #5 severe sepsis present on admission likely due to pneumonia in the setting of sarcoidosis COPD and severe dementia and possible aspiration. She was hypotensive, tachycardic tachypneic and hypoxic with lactic acidosis in the ER. Continue cefepime MRSA PCR was negative  #6 lactic acidosis  improved with IV fluids and antibiotics Lactic acid 4.3 from 4.1 from 10.8 on admission  #7 failure to thrive in the setting of advanced dementia.  Discussed with daughter who agrees for blood transfusion, DNR DNI.  #8 hypokalemia/hypophosphatemia will replete  #9 multiple pressure injuries present on admission seen by wound care possible Kennedy ulcers Per wound care-Sacrum wound is 100% tightly adhered eschar; Unstageable pressure injury; 5X6cm, Left hip is dark red-purple Deep tissue pressure injury; 6X5cm Right hip is Stage 3 pressure injury; 50% red, 30% yellow, 20% dark red-purple Deep tissue pressure injury to wound edges, 3.8X1.9cm Recommending-Apply Medihoney to sacrum and right hip wounds Q day then cover with foam dressing.  Change foam dressing Q 3 days or PRN soiling Apply Xeroform gauze to left hip wound Q day and cover with foam dressing.  Change foam dressing Q 3 days or PRN soiling.  Pressure Injury 04/24/23 Thigh Left;Posterior;Proximal Unstageable - Full  thickness tissue loss in which the base of the injury is covered by slough (yellow, tan, gray, green or brown) and/or eschar (tan, brown or black) in the wound bed. Black wound (Active)  04/24/23  1806  Location: Thigh  Location Orientation: Left;Posterior;Proximal  Staging: Unstageable - Full thickness tissue loss in which the base of the injury is covered by slough (yellow, tan, gray, green or brown) and/or eschar (tan, brown or black) in the wound bed.  Wound Description (Comments): Black wound  Present on Admission: Yes  Dressing Type Other (Comment);Gauze (Comment) 04/24/23 1715     Pressure Injury 04/24/23 Thigh Posterior;Proximal;Right Deep Tissue Pressure Injury - Purple or maroon localized area of discolored intact skin or blood-filled blister due to damage of underlying soft tissue from pressure and/or shear. (Active)  04/24/23 1813  Location: Thigh  Location Orientation: Posterior;Proximal;Right  Staging: Deep Tissue Pressure Injury - Purple or maroon localized area of discolored intact skin or blood-filled blister due to damage of underlying soft tissue from pressure and/or shear.  Wound Description (Comments):   Present on Admission: Yes  Dressing Type Other (Comment);Gauze (Comment) 04/24/23 1715     Pressure Injury 04/24/23 Sacrum Unstageable - Full thickness tissue loss in which the base of the injury is covered by slough (yellow, tan, gray, green or brown) and/or eschar (tan, brown or black) in the wound bed. (Active)  04/24/23 1821  Location: Sacrum  Location Orientation:   Staging: Unstageable - Full thickness tissue loss in which the base of the injury is covered by slough (yellow, tan, gray, green or brown) and/or eschar (tan, brown or black) in the wound bed.  Wound Description (Comments):   Present on Admission: Yes  Dressing Type Other (Comment);Gauze (Comment) 04/24/23 1715     Estimated body mass index is 14.73 kg/m as calculated from the following:   Height as of this encounter: 5' (1.524 m).   Weight as of this encounter: 34.2 kg.  DVT prophylaxis: scd Code Status: dnr Family Communication:dw daughter Disposition Plan:  Status is:  Inpatient Remains inpatient appropriate because: acute illness   Consultants:  pccm  Procedures: none Antimicrobials:cefepime  Subjective:  Patient resting in bed when called her name she opens her eyes looks at me and then closes back right away does not answer questions or follow commands Objective: Vitals:   04/25/23 0530 04/25/23 0600 04/25/23 0630 04/25/23 0700  BP:  (!) 96/51  (!) 106/54  Pulse: 96 84 90 96  Resp: (!) 30 20 (!) 24 (!) 34  Temp: 99.3 F (37.4 C) 99.7 F (37.6 C) 99.9 F (37.7 C) 100 F (37.8 C)  TempSrc:      SpO2: 100% 100% 100% 100%  Weight:      Height:        Intake/Output Summary (Last 24 hours) at 04/25/2023 0748 Last data filed at 04/25/2023 0700 Gross per 24 hour  Intake 8797.8 ml  Output 250 ml  Net 8547.8 ml   Filed Weights   04/24/23 1046 04/24/23 1800  Weight: 32.7 kg 34.2 kg    Examination:  General exam: Appears in no acute distress Respiratory system: Rhonchi left more than right  cardiovascular system: S1 & S2 heard, RRR. No JVD, murmurs, rubs, gallops or clicks. No pedal edema. Gastrointestinal system: Abdomen is nondistended, soft and nontender. No organomegaly or masses felt. Normal bowel sounds heard. Central nervous system: Does not follow commands or answer questions Extremities: No edema.     Data Reviewed: I have personally reviewed  following labs and imaging studies  CBC: Recent Labs  Lab 04/24/23 1250 04/25/23 0321  WBC 7.5 8.0  NEUTROABS 5.6  --   HGB 7.9* 6.5*  HCT 25.8* 20.7*  MCV 103.6* 100.5*  PLT 132* 119*   Basic Metabolic Panel: Recent Labs  Lab 04/24/23 1052 04/25/23 0321  NA 149* 145  K 4.2 3.1*  CL 115* 106  CO2 24 26  GLUCOSE 151* 100*  BUN 59* 44*  CREATININE 1.72* 0.82  CALCIUM 7.5* 7.8*  MG  --  2.1  PHOS  --  2.0*   GFR: Estimated Creatinine Clearance: 32 mL/min (by C-G formula based on SCr of 0.82 mg/dL). Liver Function Tests: Recent Labs  Lab 04/24/23 1052  AST 57*   ALT 21  ALKPHOS 26*  BILITOT 1.6*  PROT 4.8*  ALBUMIN 2.1*   No results for input(s): "LIPASE", "AMYLASE" in the last 168 hours. Recent Labs  Lab 04/24/23 1600  AMMONIA 13   Coagulation Profile: Recent Labs  Lab 04/24/23 1250  INR 1.4*   Cardiac Enzymes: No results for input(s): "CKTOTAL", "CKMB", "CKMBINDEX", "TROPONINI" in the last 168 hours. BNP (last 3 results) No results for input(s): "PROBNP" in the last 8760 hours. HbA1C: No results for input(s): "HGBA1C" in the last 72 hours. CBG: Recent Labs  Lab 04/24/23 1031  GLUCAP 125*   Lipid Profile: No results for input(s): "CHOL", "HDL", "LDLCALC", "TRIG", "CHOLHDL", "LDLDIRECT" in the last 72 hours. Thyroid Function Tests: Recent Labs    04/24/23 1600  TSH 1.058   Anemia Panel: No results for input(s): "VITAMINB12", "FOLATE", "FERRITIN", "TIBC", "IRON", "RETICCTPCT" in the last 72 hours. Sepsis Labs: Recent Labs  Lab 04/24/23 1058 04/24/23 1524 04/24/23 1600 04/24/23 1827  LATICACIDVEN 6.5* 10.8* 4.1* 4.3*    Recent Results (from the past 240 hours)  Resp panel by RT-PCR (RSV, Flu A&B, Covid) Anterior Nasal Swab     Status: None   Collection Time: 04/24/23 10:41 AM   Specimen: Anterior Nasal Swab  Result Value Ref Range Status   SARS Coronavirus 2 by RT PCR NEGATIVE NEGATIVE Final    Comment: (NOTE) SARS-CoV-2 target nucleic acids are NOT DETECTED.  The SARS-CoV-2 RNA is generally detectable in upper respiratory specimens during the acute phase of infection. The lowest concentration of SARS-CoV-2 viral copies this assay can detect is 138 copies/mL. A negative result does not preclude SARS-Cov-2 infection and should not be used as the sole basis for treatment or other patient management decisions. A negative result may occur with  improper specimen collection/handling, submission of specimen other than nasopharyngeal swab, presence of viral mutation(s) within the areas targeted by this assay, and  inadequate number of viral copies(<138 copies/mL). A negative result must be combined with clinical observations, patient history, and epidemiological information. The expected result is Negative.  Fact Sheet for Patients:  BloggerCourse.com  Fact Sheet for Healthcare Providers:  SeriousBroker.it  This test is no t yet approved or cleared by the [place] FDA and  has been authorized for detection and/or diagnosis of SARS-CoV-2 by FDA under an Emergency Use Authorization (EUA). This EUA will remain  in effect (meaning this test can be used) for the duration of the COVID-19 declaration under Section 564(b)(1) of the Act, 21 U.S.C.section 360bbb-3(b)(1), unless the authorization is terminated  or revoked sooner.       Influenza A by PCR NEGATIVE NEGATIVE Final   Influenza B by PCR NEGATIVE NEGATIVE Final    Comment: (NOTE) The Xpert Xpress SARS-CoV-2/FLU/RSV plus  assay is intended as an aid in the diagnosis of influenza from Nasopharyngeal swab specimens and should not be used as a sole basis for treatment. Nasal washings and aspirates are unacceptable for Xpert Xpress SARS-CoV-2/FLU/RSV testing.  Fact Sheet for Patients: BloggerCourse.com  Fact Sheet for Healthcare Providers: SeriousBroker.it  This test is not yet approved or cleared by the [place] FDA and has been authorized for detection and/or diagnosis of SARS-CoV-2 by FDA under an Emergency Use Authorization (EUA). This EUA will remain in effect (meaning this test can be used) for the duration of the COVID-19 declaration under Section 564(b)(1) of the Act, 21 U.S.C. section 360bbb-3(b)(1), unless the authorization is terminated or revoked.     Resp Syncytial Virus by PCR NEGATIVE NEGATIVE Final    Comment: (NOTE) Fact Sheet for Patients: BloggerCourse.com  Fact Sheet for Healthcare  Providers: SeriousBroker.it  This test is not yet approved or cleared by the [place] FDA and has been authorized for detection and/or diagnosis of SARS-CoV-2 by FDA under an Emergency Use Authorization (EUA). This EUA will remain in effect (meaning this test can be used) for the duration of the COVID-19 declaration under Section 564(b)(1) of the Act, 21 U.S.C. section 360bbb-3(b)(1), unless the authorization is terminated or revoked.  Performed at [facility], [address], [place], [place] [address]          Radiology Studies: US Abdomen Limited RUQ (LIVER/GB) Result Date: 04/24/2023 CLINICAL DATA:  Sepsis. EXAM: ULTRASOUND ABDOMEN LIMITED RIGHT UPPER QUADRANT COMPARISON:  August 28, 2022. FINDINGS: Gallbladder: Status post cholecystectomy. Common bile duct: Diameter: 4 mm which is within normal limits. Liver: No focal lesion identified. Within normal limits in parenchymal echogenicity. Portal vein is patent on color Doppler imaging with normal direction of blood flow towards the liver. Other: None. IMPRESSION: No acute abnormality seen in the right upper quadrant of the abdomen. Electronically Signed   By: Lupita Raider M.D.   On: 04/24/2023 14:04   CT Head Wo Contrast Result Date: 04/24/2023 CLINICAL DATA:  Mental status change, unknown cause EXAM: CT HEAD WITHOUT CONTRAST TECHNIQUE: Contiguous axial images were obtained from the base of the skull through the vertex without intravenous contrast. RADIATION DOSE REDUCTION: This exam was performed according to the departmental dose-optimization program which includes automated exposure control, adjustment of the mA and/or kV according to patient size and/or use of iterative reconstruction technique. COMPARISON:  CT head April 20, 23. FINDINGS: Motion limited study.  Within this limitation: Brain: No evidence of acute infarction, hemorrhage, hydrocephalus, extra-axial collection or mass  lesion/mass effect. Patchy white matter hypodensities, nonspecific but compatible with chronic microvascular disease. Cerebral atrophy. Vascular: No hyperdense vessel identified. Calcific atherosclerosis. Skull: No acute fracture. Sinuses/Orbits: Clear sinuses.  No acute orbital findings. Other: No mastoid effusions. IMPRESSION: 1. No evidence of acute intracranial abnormality. 2.  Cerebral Atrophy (ICD10-G31.9). Electronically Signed   By: Feliberto Harts M.D.   On: 04/24/2023 12:42   DG Chest Portable 1 View Result Date: 04/24/2023 CLINICAL DATA:  Questionable sepsis. EXAM: PORTABLE CHEST 1 VIEW COMPARISON:  Radiographs 07/22/2019 and 05/06/2017.  CT 08/08/2016. FINDINGS: 1145 hours. The heart size and mediastinal contours are grossly stable. There is severe chronic lung disease with biapical scarring and superior hilar retraction bilaterally. Interval increased density at the left lung apex which could reflect progressive scarring, superimposed infection or developing mass. No significant change identified within the right hemithorax. There is no pleural effusion or pneumothorax. The bones appear unchanged. IMPRESSION: 1.  Increased left apical opacity and volume loss compared with prior studies from 4 years ago. Findings could be secondary to progressive scarring, superimposed infection or developing mass. Correlate clinically. CT may be helpful for further evaluation. 2. Otherwise grossly stable chronic lung disease attributed to underlying sarcoidosis. Electronically Signed   By: Carey Bullocks M.D.   On: 04/24/2023 12:19        Scheduled Meds:  sodium chloride   Intravenous Once   heparin  5,000 Units Subcutaneous Q12H   leptospermum manuka honey  1 Application Topical Daily   pantoprazole (PROTONIX) IV  40 mg Intravenous Q24H   Continuous Infusions:  ceFEPime (MAXIPIME) IV     potassium PHOSPHATE IVPB (in mmol)       LOS: 1 day   Alwyn Ren, MD  04/25/2023, 7:48 AM

## 2023-04-25 NOTE — Plan of Care (Signed)
Patient showed little improvement overnight

## 2023-04-25 NOTE — Progress Notes (Signed)
Called patient's daughter, [name], for blood consent and daughter expressed wishes to  "Think about it" and stated she would be here to visit patient around 10 am. Communication shared with day shift RN team.

## 2023-04-26 DIAGNOSIS — A419 Sepsis, unspecified organism: Secondary | ICD-10-CM | POA: Diagnosis not present

## 2023-04-26 DIAGNOSIS — Z7189 Other specified counseling: Secondary | ICD-10-CM

## 2023-04-26 DIAGNOSIS — R579 Shock, unspecified: Secondary | ICD-10-CM | POA: Diagnosis not present

## 2023-04-26 DIAGNOSIS — Z515 Encounter for palliative care: Secondary | ICD-10-CM | POA: Diagnosis not present

## 2023-04-26 LAB — COMPREHENSIVE METABOLIC PANEL
ALT: 53 U/L — ABNORMAL HIGH (ref 0–44)
AST: 71 U/L — ABNORMAL HIGH (ref 15–41)
Albumin: 2.5 g/dL — ABNORMAL LOW (ref 3.5–5.0)
Alkaline Phosphatase: 49 U/L (ref 38–126)
Anion gap: 11 (ref 5–15)
BUN: 33 mg/dL — ABNORMAL HIGH (ref 8–23)
CO2: 24 mmol/L (ref 22–32)
Calcium: 8.3 mg/dL — ABNORMAL LOW (ref 8.9–10.3)
Chloride: 108 mmol/L (ref 98–111)
Creatinine, Ser: 0.53 mg/dL (ref 0.44–1.00)
GFR, Estimated: >60 mL/min (ref >60)
Glucose, Bld: 89 mg/dL (ref 70–99)
Potassium: 4.1 mmol/L (ref 3.5–5.1)
Sodium: 143 mmol/L (ref 135–145)
Total Bilirubin: 4 mg/dL — ABNORMAL HIGH (ref 0.0–1.2)
Total Protein: 5.4 g/dL — ABNORMAL LOW (ref 6.5–8.1)

## 2023-04-26 LAB — CBC
HCT: 31.8 % — ABNORMAL LOW (ref 36.0–46.0)
Hemoglobin: 9.9 g/dL — ABNORMAL LOW (ref 12.0–15.0)
MCH: 30.9 pg (ref 26.0–34.0)
MCHC: 31.1 g/dL (ref 30.0–36.0)
MCV: 99.4 fL (ref 80.0–100.0)
Platelets: 135 10*3/uL — ABNORMAL LOW (ref 150–400)
RBC: 3.2 MIL/uL — ABNORMAL LOW (ref 3.87–5.11)
RDW: 20.1 % — ABNORMAL HIGH (ref 11.5–15.5)
WBC: 11.6 10*3/uL — ABNORMAL HIGH (ref 4.0–10.5)
nRBC: 0.6 % — ABNORMAL HIGH (ref 0.0–0.2)

## 2023-04-26 LAB — BASIC METABOLIC PANEL
Anion gap: 7 (ref 5–15)
BUN: 26 mg/dL — ABNORMAL HIGH (ref 8–23)
CO2: 26 mmol/L (ref 22–32)
Calcium: 8 mg/dL — ABNORMAL LOW (ref 8.9–10.3)
Chloride: 108 mmol/L (ref 98–111)
Creatinine, Ser: 0.46 mg/dL (ref 0.44–1.00)
GFR, Estimated: >60 mL/min (ref >60)
Glucose, Bld: 292 mg/dL — ABNORMAL HIGH (ref 70–99)
Potassium: 3.8 mmol/L (ref 3.5–5.1)
Sodium: 141 mmol/L (ref 135–145)

## 2023-04-26 LAB — GLUCOSE, CAPILLARY
Glucose-Capillary: 109 mg/dL — ABNORMAL HIGH (ref 70–99)
Glucose-Capillary: 235 mg/dL — ABNORMAL HIGH (ref 70–99)
Glucose-Capillary: <10 mg/dL — CL (ref 70–99)
Glucose-Capillary: <10 mg/dL — CL (ref 70–99)
Glucose-Capillary: <10 mg/dL — CL (ref 70–99)

## 2023-04-26 LAB — PHOSPHORUS: Phosphorus: 2.6 mg/dL (ref 2.5–4.6)

## 2023-04-26 MED ORDER — DEXTROSE 50 % IV SOLN
INTRAVENOUS | Status: AC
  Administered 2023-04-26: 25 g via INTRAVENOUS
  Filled 2023-04-26: qty 50

## 2023-04-26 MED ORDER — DEXTROSE 50 % IV SOLN: 25.0000 g | INTRAVENOUS | Status: AC

## 2023-04-26 MED ORDER — DEXTROSE 10 % IV SOLN: INTRAVENOUS | Status: AC

## 2023-04-26 MED ORDER — DEXTROSE 50 % IV SOLN
1.0000 | INTRAVENOUS | Status: AC | PRN
  Administered 2023-04-27: 50 mL via INTRAVENOUS

## 2023-04-26 MED ORDER — DEXTROSE 50 % IV SOLN
INTRAVENOUS | Status: AC
  Administered 2023-04-26: 50 mL via INTRAVENOUS
  Filled 2023-04-26: qty 100

## 2023-04-26 NOTE — Plan of Care (Signed)

## 2023-04-26 NOTE — Progress Notes (Incomplete)
     Patient Name: Patricia Davies           DOB: 31-May-1945  MRN: [id number]      Admission Date: 04/24/2023  Attending Provider: Alwyn Ren, MD  Primary Diagnosis: Shock [facility])   Level of care: Stepdown   OVERNIGHT PROGRESS REPORT   Hypoglycemic, CBG < 10.  Verified with different blood samples.  Patient is awake and encephalopathic (since admission). Nursing staff have given D50.  Patient is apparently not eating or drinking.   STAT serum glucose ordered to verify levels.  If still severely hypoglycemic, will start D10 gtt.    Anthoney Harada, DNP, ACNPC- AG [facility]

## 2023-04-26 NOTE — TOC Initial Note (Signed)
 Transition of Care [facility]) - Initial/Assessment Note    Patient Details  Name: Patricia Davies MRN: [id number] Date of Birth: 09-11-1945  [facility] CM/SW Contact:    Adrian Prows, RN Phone Number: 04/26/2023, 12:45 PM  Clinical Narrative:                 TOC for d/c planning; pt has dementia; called her dtr Patricia Davies [phone]); she says pt lives at home w/her; she plans for pt to return at d/c; pt's dtr says she will provide transportation; she verified PCP/insurance; she denies pt experiencing SDOH risks; pt has cane, walker, wheelchair, and shower chair; pt does not have HH services or home oxygen; TOC will follow.  Expected Discharge Plan: Home/Self Care Barriers to Discharge: Continued Medical Work up   Patient Goals and CMS Choice Patient states their goals for this hospitalization and ongoing recovery are:: pt's daughter Patricia Davies says she will return home (Patricia Davies (dtr)) CMS Medicare.gov Compare Post Acute Care list provided to:: Patient Represenative (must comment)        Expected Discharge Plan and Services   Discharge Planning Services: CM Consult   Living arrangements for the past 2 months: Single Family Home                                      Prior Living Arrangements/Services Living arrangements for the past 2 months: Single Family Home Lives with:: Adult Children Patient language and need for interpreter reviewed:: Yes Do you feel safe going back to the place where you live?: Yes      Need for Family Participation in Patient Care: Yes (Comment) Care giver support system in place?: Yes (comment) Current home services: DME (cane, walker, wheelchair, shower chair) Criminal Activity/Legal Involvement Pertinent to Current Situation/Hospitalization: No - Comment as needed  Activities of Daily Living      Permission Sought/Granted Permission sought to share information with : Case Manager Permission  granted to share information with : Yes, Verbal Permission Granted  Share Information with NAME: Case Manager     Permission granted to share info w Relationship: Patricia Davies (dtr) [phone]     Emotional Assessment Appearance:: Appears stated age Attitude/Demeanor/Rapport: Unable to Assess Affect (typically observed): Unable to Assess Orientation: :  (unable to assess) Alcohol / Substance Use: Not Applicable Psych Involvement: No (comment)  Admission diagnosis:  Shock (HCC) [R57.9] Sepsis, due to unspecified organism, unspecified whether acute organ dysfunction present [facility]) [A41.9] Patient Active Problem List   Diagnosis Date Noted   Shock (HCC) 04/24/2023   Hypovolemia 04/24/2023   Failure to thrive in adult 04/24/2023   Chronic hypokalemia 08/22/2021   Dementia with behavioral disturbance (HCC) 08/22/2021   Protein-calorie malnutrition, moderate (HCC) 11/11/2017   Routine general medical examination at a health care facility 11/11/2017   Severe dementia without behavioral disturbance, psychotic disturbance, mood disturbance, or anxiety (HCC) 12/02/2016   Mycetoma, unspecified 08/08/2016   Hyperlipidemia LDL goal <130 08/07/2016   Dietary folate deficiency anemia 06/01/2015   Vitamin B12 deficiency neuropathy (HCC) 09/15/2014   COPD mixed type (HCC) 04/05/2011   Sarcoidosis 03/16/2010   Essential hypertension 03/16/2010   PCP:  Patricia Grandchild, MD Pharmacy:   [facility] [id number] Patricia Davies, [place] - [address] [place] [address] Phone: [phone] Fax: [phone]  [id number] Patricia Davies, [place] - [address] AT [facility] [address] [place] [address] Phone: [phone] Fax: [phone]     Social Drivers of Health (SDOH) Social History: SDOH Screenings   Food Insecurity: No Food Insecurity (04/26/2023)  Housing: Low Risk  (04/26/2023)  Transportation Needs: No Transportation Needs  (04/26/2023)  Utilities: Not At Risk (04/26/2023)  Alcohol Screen: Low Risk  (12/06/2021)  Depression (PHQ2-9): Low Risk  (12/06/2021)  Financial Resource Strain: Low Risk  (12/06/2021)  Physical Activity: Inactive (12/06/2021)  Social Connections: Unknown (12/06/2021)  Stress: No Stress Concern Present (12/06/2021)  Tobacco Use: Medium Risk (04/24/2023)   SDOH Interventions: Food Insecurity Interventions: Intervention Not Indicated, Inpatient TOC Housing Interventions: Intervention Not Indicated, Inpatient TOC Transportation Interventions: Intervention Not Indicated, Inpatient TOC Utilities Interventions: Intervention Not Indicated, Inpatient TOC   Readmission Risk Interventions     No data to display

## 2023-04-26 NOTE — Progress Notes (Signed)
 PROGRESS NOTE    DEL WISEMAN  ZOX:[id number] DOB: Apr 25, 1945 DOA: 04/24/2023 PCP: [name], MD   Brief Narrative: TRH pickup 04/25/2023 Admitted by PCCM on 04/24/2023 This is a 78 year old  female lives at home with her daughter for the last 3 years due to advanced dementia.  She has a past medical history significant for sarcoidosis COPD hypertension, kidney stones B12 deficiency and dementia.  She has been struggling with dementia for the past 7 to 8 years.  Patient had gradually progressive decline in the past 3 years.  Patient has very poor appetite does not eat or drink and has had significant weight loss.  On arrival to the emergency room she was obtunded hypotensive tachycardic  On admission her sodium was 149 BUN was 59 creatinine 1.72, albumin 2.1, lactic acid 6.5, hemoglobin 7.9, platelets 132 COVID RSV and flu were negative. ABG 7. 4/37/196 Chest x-ray -  Increased left apical opacity and volume loss compared with prior studies from 4 years ago. Findings could be secondary to progressive scarring, superimposed infection or developing mass. Correlate clinically. CT may be helpful for further evaluation.Otherwise grossly stable chronic lung disease attributed to underlying sarcoidosis. CT head showed cerebral atrophy and no evidence of acute intracranial abnormality  Assessment & Plan:   Principal Problem:   Shock (HCC) Active Problems:   Severe dementia without behavioral disturbance, psychotic disturbance, mood disturbance, or anxiety (HCC)   Hypovolemia   Failure to thrive in adult   #1 Acute metabolic encephalopathy likely secondary to multifactorial issues in the setting of severe dementia, pneumonia, shock, dehydration. Continue supportive measures as below  #2 acute on chronic macrocytic anemia-hemoglobin 6.5 down from 7.9 Partly due to hemodilution from all the fluids that she has received so far. She is positive over 8 L Transfused 1 unit of packed RBC on  04/25/2023, hemoglobin 9.9 from 10 from 6.5 after transfusion. Folate level low B12 level normal Will start oral folate supplementation And continue B12  #3 AKI and hypernatremia resolved with IV fluids secondary to dehydration poor p.o. intake  #4 shock she presented obtunded initial blood pressure with a EMS was 50/60 responded to IV fluids improved  #5 severe sepsis present on admission likely due to pneumonia in the setting of sarcoidosis COPD and severe dementia and possible aspiration. She was hypotensive, tachycardic tachypneic and hypoxic with lactic acidosis in the ER. Dc cefepime 2/21 Started unasyn 2/21 MRSA PCR was negative Chest xray 2/21-Continued left upper lobe opacity is noted concerning for pneumonia, scarring or possibly underlying malignancy. CT scan of the chest is recommended for further evaluation as noted on prior exam. Stable probable right lung scarring.  #6 lactic acidosis  improved with IV fluids and antibiotics Lactic acid 4.3 from 4.1 from 10.8 on admission  #7 failure to thrive in the setting of advanced dementia.  Discussed with daughter who agrees for blood transfusion, DNR DNI.  #8 hypokalemia/hypophosphatemia repleted  #9 multiple pressure injuries present on admission seen by wound care possible Kennedy ulcers Per wound care-Sacrum wound is 100% tightly adhered eschar; Unstageable pressure injury; 5X6cm, Left hip is dark red-purple Deep tissue pressure injury; 6X5cm Right hip is Stage 3 pressure injury; 50% red, 30% yellow, 20% dark red-purple Deep tissue pressure injury to wound edges, 3.8X1.9cm Recommending-Apply Medihoney to sacrum and right hip wounds Q day then cover with foam dressing.  Change foam dressing Q 3 days or PRN soiling Apply Xeroform gauze to left hip wound Q day and cover with  foam dressing.  Change foam dressing Q 3 days or PRN soiling.  Pressure Injury 04/24/23 Hip Left Deep Tissue Pressure Injury - Purple or maroon localized  area of discolored intact skin or blood-filled blister due to damage of underlying soft tissue from pressure and/or shear. (Active)  04/24/23 1806  Location: Hip  Location Orientation: Left  Staging: Deep Tissue Pressure Injury - Purple or maroon localized area of discolored intact skin or blood-filled blister due to damage of underlying soft tissue from pressure and/or shear.  Wound Description (Comments):   Present on Admission: Yes  Dressing Type Gauze (Comment) 04/26/23 0800     Pressure Injury 04/24/23 Hip Right Stage 3 -  Full thickness tissue loss. Subcutaneous fat may be visible but bone, tendon or muscle are NOT exposed. (Active)  04/24/23 1813  Location: Hip  Location Orientation: Right  Staging: Stage 3 -  Full thickness tissue loss. Subcutaneous fat may be visible but bone, tendon or muscle are NOT exposed.  Wound Description (Comments):   Present on Admission: Yes  Dressing Type Gauze (Comment) 04/26/23 0800     Pressure Injury 04/24/23 Sacrum Unstageable - Full thickness tissue loss in which the base of the injury is covered by slough (yellow, tan, gray, green or brown) and/or eschar (tan, brown or black) in the wound bed. (Active)  04/24/23 1821  Location: Sacrum  Location Orientation:   Staging: Unstageable - Full thickness tissue loss in which the base of the injury is covered by slough (yellow, tan, gray, green or brown) and/or eschar (tan, brown or black) in the wound bed.  Wound Description (Comments):   Present on Admission: Yes  Dressing Type Gauze (Comment) 04/26/23 0800     Estimated body mass index is 16.58 kg/m as calculated from the following:   Height as of this encounter: 5' (1.524 m).   Weight as of this encounter: 38.5 kg.  DVT prophylaxis: scd Code Status: dnr Family Communication:dw daughter Disposition Plan:  Status is: Inpatient Remains inpatient appropriate because: acute illness   Consultants:  pccm  Procedures:  none Antimicrobials:cefepime  Subjective:  No events overnight Got prbc yest Hb improved She is resting smiling not much conversive or following commands  Objective: Vitals:   04/26/23 0600 04/26/23 0700 04/26/23 0710 04/26/23 0800  BP: (!) 124/57 113/65  (!) 116/59  Pulse:  64 61   Resp: 11 11 10 10   Temp: 97.7 F (36.5 C) 97.7 F (36.5 C) (!) 97.5 F (36.4 C) 97.7 F (36.5 C)  TempSrc:    Bladder  SpO2:  100% 100% 100%  Weight:      Height:        Intake/Output Summary (Last 24 hours) at 04/26/2023 1242 Last data filed at 04/26/2023 1610 Gross per 24 hour  Intake 1439.44 ml  Output 525 ml  Net 914.44 ml   Filed Weights   04/24/23 1046 04/24/23 1800 04/26/23 0500  Weight: 32.7 kg 34.2 kg 38.5 kg    Examination:  General exam: Appears in no acute distress Respiratory system: Rhonchi left more than right  cardiovascular system: S1 & S2 heard, RRR. No JVD, murmurs, rubs, gallops or clicks. No pedal edema. Gastrointestinal system: Abdomen is nondistended, soft and nontender. No organomegaly or masses felt. Normal bowel sounds heard. Central nervous system: Does not follow commands or answer questions Extremities: No edema.     Data Reviewed: I have personally reviewed following labs and imaging studies  CBC: Recent Labs  Lab 04/24/23 1250 04/25/23 0321 04/25/23  1831 04/26/23 0320  WBC 7.5 8.0  --  11.6*  NEUTROABS 5.6  --   --   --   HGB 7.9* 6.5* 10.0* 9.9*  HCT 25.8* 20.7* 32.1* 31.8*  MCV 103.6* 100.5*  --  99.4  PLT 132* 119*  --  135*   Basic Metabolic Panel: Recent Labs  Lab 04/24/23 1052 04/25/23 0321 04/26/23 0320  NA 149* 145 143  K 4.2 3.1* 4.1  CL 115* 106 108  CO2 24 26 24   GLUCOSE 151* 100* 89  BUN 59* 44* 33*  CREATININE 1.72* 0.82 0.53  CALCIUM 7.5* 7.8* 8.3*  MG  --  2.1  --   PHOS  --  2.0*  --    GFR: Estimated Creatinine Clearance: 36.9 mL/min (by C-G formula based on SCr of 0.53 mg/dL). Liver Function Tests: Recent  Labs  Lab 04/24/23 1052 04/26/23 0320  AST 57* 71*  ALT 21 53*  ALKPHOS 26* 49  BILITOT 1.6* 4.0*  PROT 4.8* 5.4*  ALBUMIN 2.1* 2.5*   No results for input(s): "LIPASE", "AMYLASE" in the last 168 hours. Recent Labs  Lab 04/24/23 1600  AMMONIA 13   Coagulation Profile: Recent Labs  Lab 04/24/23 1250  INR 1.4*   Cardiac Enzymes: No results for input(s): "CKTOTAL", "CKMB", "CKMBINDEX", "TROPONINI" in the last 168 hours. BNP (last 3 results) No results for input(s): "PROBNP" in the last 8760 hours. HbA1C: No results for input(s): "HGBA1C" in the last 72 hours. CBG: Recent Labs  Lab 04/24/23 1031  GLUCAP 125*   Lipid Profile: No results for input(s): "CHOL", "HDL", "LDLCALC", "TRIG", "CHOLHDL", "LDLDIRECT" in the last 72 hours. Thyroid Function Tests: Recent Labs    04/24/23 1600  TSH 1.058   Anemia Panel: Recent Labs    04/25/23 1831  VITAMINB12 596  FOLATE 3.2*   Sepsis Labs: Recent Labs  Lab 04/24/23 1058 04/24/23 1524 04/24/23 1600 04/24/23 1827  LATICACIDVEN 6.5* 10.8* 4.1* 4.3*    Recent Results (from the past 240 hours)  Resp panel by RT-PCR (RSV, Flu A&B, Covid) Anterior Nasal Swab     Status: None   Collection Time: 04/24/23 10:41 AM   Specimen: Anterior Nasal Swab  Result Value Ref Range Status   SARS Coronavirus 2 by RT PCR NEGATIVE NEGATIVE Final    Comment: (NOTE) SARS-CoV-2 target nucleic acids are NOT DETECTED.  The SARS-CoV-2 RNA is generally detectable in upper respiratory specimens during the acute phase of infection. The lowest concentration of SARS-CoV-2 viral copies this assay can detect is 138 copies/mL. A negative result does not preclude SARS-Cov-2 infection and should not be used as the sole basis for treatment or other patient management decisions. A negative result may occur with  improper specimen collection/handling, submission of specimen other than nasopharyngeal swab, presence of viral mutation(s) within the areas  targeted by this assay, and inadequate number of viral copies(<138 copies/mL). A negative result must be combined with clinical observations, patient history, and epidemiological information. The expected result is Negative.  Fact Sheet for Patients:  BloggerCourse.com  Fact Sheet for Healthcare Providers:  SeriousBroker.it  This test is no t yet approved or cleared by the [place] FDA and  has been authorized for detection and/or diagnosis of SARS-CoV-2 by FDA under an Emergency Use Authorization (EUA). This EUA will remain  in effect (meaning this test can be used) for the duration of the COVID-19 declaration under Section 564(b)(1) of the Act, 21 U.S.C.section 360bbb-3(b)(1), unless the authorization is terminated  or  revoked sooner.       Influenza A by PCR NEGATIVE NEGATIVE Final   Influenza B by PCR NEGATIVE NEGATIVE Final    Comment: (NOTE) The Xpert Xpress SARS-CoV-2/FLU/RSV plus assay is intended as an aid in the diagnosis of influenza from Nasopharyngeal swab specimens and should not be used as a sole basis for treatment. Nasal washings and aspirates are unacceptable for Xpert Xpress SARS-CoV-2/FLU/RSV testing.  Fact Sheet for Patients: BloggerCourse.com  Fact Sheet for Healthcare Providers: SeriousBroker.it  This test is not yet approved or cleared by the [place] FDA and has been authorized for detection and/or diagnosis of SARS-CoV-2 by FDA under an Emergency Use Authorization (EUA). This EUA will remain in effect (meaning this test can be used) for the duration of the COVID-19 declaration under Section 564(b)(1) of the Act, 21 U.S.C. section 360bbb-3(b)(1), unless the authorization is terminated or revoked.     Resp Syncytial Virus by PCR NEGATIVE NEGATIVE Final    Comment: (NOTE) Fact Sheet for  Patients: BloggerCourse.com  Fact Sheet for Healthcare Providers: SeriousBroker.it  This test is not yet approved or cleared by the [place] FDA and has been authorized for detection and/or diagnosis of SARS-CoV-2 by FDA under an Emergency Use Authorization (EUA). This EUA will remain in effect (meaning this test can be used) for the duration of the COVID-19 declaration under Section 564(b)(1) of the Act, 21 U.S.C. section 360bbb-3(b)(1), unless the authorization is terminated or revoked.  Performed at [facility], [address] [place], [place] [address]   Blood Culture (routine x 2)     Status: None (Preliminary result)   Collection Time: 04/24/23 10:50 AM   Specimen: BLOOD  Result Value Ref Range Status   Specimen Description   Final    BLOOD RIGHT ANTECUBITAL Performed at [facility], [address], [place], [place] [address]    Special Requests   Final    BOTTLES DRAWN AEROBIC ONLY Blood Culture results may not be optimal due to an inadequate volume of blood received in culture bottles Performed at [facility], [address] [place], [place] [address]    Culture   Final    NO GROWTH 2 DAYS Performed at [facility], [address] [place], [place] [address]    Report Status PENDING  Incomplete  Blood Culture (routine x 2)     Status: None (Preliminary result)   Collection Time: 04/24/23  4:00 PM   Specimen: BLOOD RIGHT ARM  Result Value Ref Range Status   Specimen Description   Final    BLOOD RIGHT ARM Performed at [facility], [address] [place], [place] [address]    Special Requests   Final    BOTTLES DRAWN AEROBIC ONLY Blood Culture results may not be optimal due to an inadequate volume of blood received in culture bottles Performed at [facility], [address] [place], [place] [address]     Culture   Final    NO GROWTH 2 DAYS Performed at [facility], [address], [place], [place] [address]    Report Status PENDING  Incomplete  MRSA Next Gen by PCR, Nasal     Status: None   Collection Time: 04/25/23  4:32 PM   Specimen: Nasal Mucosa; Nasal Swab  Result Value Ref Range Status   MRSA by PCR Next Gen NOT DETECTED NOT DETECTED Final    Comment: (NOTE) The GeneXpert MRSA Assay (  FDA approved for NASAL specimens only), is one component of a comprehensive MRSA colonization surveillance program. It is not intended to diagnose MRSA infection nor to guide or monitor treatment for MRSA infections. Test performance is not FDA approved in patients less than 28 years old. Performed at [facility], [address] [place], [place] [address]          Radiology Studies: DG Chest 1 View Result Date: 04/25/2023 CLINICAL DATA:  Hypoxia. EXAM: CHEST  1 VIEW COMPARISON:  April 24, 2023. FINDINGS: Stable cardiomediastinal silhouette. Continued left upper lobe opacity is noted concerning for pneumonia, scarring or possibly underlying malignancy. Stable right lung opacities are noted concerning for scarring or inflammation. IMPRESSION: Continued left upper lobe opacity is noted concerning for pneumonia, scarring or possibly underlying malignancy. CT scan of the chest is recommended for further evaluation as noted on prior exam. Stable probable right lung scarring. Electronically Signed   By: Lupita Raider M.D.   On: 04/25/2023 13:21   US Abdomen Limited RUQ (LIVER/GB) Result Date: 04/24/2023 CLINICAL DATA:  Sepsis. EXAM: ULTRASOUND ABDOMEN LIMITED RIGHT UPPER QUADRANT COMPARISON:  August 28, 2022. FINDINGS: Gallbladder: Status post cholecystectomy. Common bile duct: Diameter: 4 mm which is within normal limits. Liver: No focal lesion identified. Within normal limits in parenchymal echogenicity. Portal vein is patent on color Doppler imaging with normal direction of  blood flow towards the liver. Other: None. IMPRESSION: No acute abnormality seen in the right upper quadrant of the abdomen. Electronically Signed   By: Lupita Raider M.D.   On: 04/24/2023 14:04        Scheduled Meds:  Chlorhexidine Gluconate Cloth  6 each Topical QHS   leptospermum manuka honey  1 Application Topical QHS   pantoprazole (PROTONIX) IV  40 mg Intravenous Q24H   Continuous Infusions:  ampicillin-sulbactam (UNASYN) IV Stopped (04/26/23 0630)     LOS: 2 days   Alwyn Ren, MD  04/26/2023, 12:42 PM

## 2023-04-26 NOTE — Consult Note (Signed)
 Consultation Note Date: 04/26/2023   Patient Name: Patricia Davies  DOB: November 20, 1945  MRN: [id number]  Age / Sex: 78 y.o., female  PCP: [name], MD Referring Physician: Alwyn Ren, MD  Reason for Consultation: Establishing goals of care  HPI/Patient Profile: 78 y.o. female  admitted on 04/24/2023    Clinical Assessment and Goals of Care:  78 year old lady with dementia, lives at home with daughter, has advanced dementia, sarcoidosis HTN kidney stones B12 deficiency. Patient with progressive decline for the past 3 years, poor appetite and weight loss.  Palliative medicine is specialized medical care for people living with serious illness. It focuses on providing relief from the symptoms and stress of a serious illness. The goal is to improve quality of life for both the patient and the family. Goals of care: Broad aims of medical therapy in relation to the patient's values and preferences. Our aim is to provide medical care aimed at enabling patients to achieve the goals that matter most to them, given the circumstances of their particular medical situation and their constraints.   I met with the patient's daughter at bedside, introduced palliative care. We discussed about decline trajectory from dementia standpoint. We talked about SLP eval, comfort feeds, no feeding tube. Answered all of her questions, addressed her concerns to the best of my ability.    NEXT OF KIN  Lives at home with daughter Patricia Davies, also has a son.   SUMMARY OF RECOMMENDATIONS    DNR SLP eval, no artificial feeding, safest possible PO diet to be started please Continue current mode of care Home with home health+palliative versus home with hospice based on how the patient does in this hospitalization.  Thank you for the consult.   Code Status/Advance Care Planning: DNR   Symptom Management:     Palliative  Prophylaxis:  Delirium Protocol  Additional Recommendations (Limitations, Scope, Preferences): No Artificial Feeding  Psycho-social/Spiritual:  Desire for further Chaplaincy support:yes Additional Recommendations: Education on Hospice  Prognosis:  Unable to determine  Discharge Planning: To Be Determined      Primary Diagnoses: Present on Admission:  Shock (HCC)  Severe dementia without behavioral disturbance, psychotic disturbance, mood disturbance, or anxiety (HCC)   I have reviewed the medical record, interviewed the patient and family, and examined the patient. The following aspects are pertinent.  Past Medical History:  Diagnosis Date   COPD (chronic obstructive pulmonary disease) (HCC)    History of nephrolithiasis    Hypertension    Sarcoidosis of skin    Vitamin B12 deficiency    Social History   Socioeconomic History   Marital status: Single    Spouse name: Not on file   Number of children: 2   Years of education: 14   Highest education level: Not on file  Occupational History   Occupation: retired    Comment: Runner, broadcasting/film/video  Tobacco Use   Smoking status: Former    Current packs/day: 0.00    Average packs/day: 0.5 packs/day for 20.0 years (10.0  ttl pk-yrs)    Types: Cigarettes    Start date: 03/04/1970    Quit date: 03/04/1990    Years since quitting: 33.1   Smokeless tobacco: Never   Tobacco comments:    Regukar exercise - Yes  Vaping Use   Vaping status: Never Used  Substance and Sexual Activity   Alcohol use: Not Currently    Alcohol/week: 1.0 standard drink of alcohol    Types: 1 Cans of beer per week    Comment: social   Drug use: No   Sexual activity: Not Currently  Other Topics Concern   Not on file  Social History Narrative   Pt lives in 1 story home with friend   Has 2 adult children   Highest level of education: BS   Retired Engineer, site   Drinks sodas   One story home   Social Drivers of Health   Financial Resource Strain: Low  Risk  (12/06/2021)   Overall Financial Resource Strain (CARDIA)    Difficulty of Paying Living Expenses: Not very hard  Food Insecurity: No Food Insecurity (04/26/2023)   Hunger Vital Sign    Worried About Running Out of Food in the Last Year: Never true    Ran Out of Food in the Last Year: Never true  Transportation Needs: No Transportation Needs (04/26/2023)   PRAPARE - Administrator, Civil Service (Medical): No    Lack of Transportation (Non-Medical): No  Physical Activity: Inactive (12/06/2021)   Exercise Vital Sign    Days of Exercise per Week: 0 days    Minutes of Exercise per Session: 0 min  Stress: No Stress Concern Present (12/06/2021)   [employer] of Occupational Health - Occupational Stress Questionnaire    Feeling of Stress : Only a little  Social Connections: Unknown (12/06/2021)   Social Connection and Isolation Panel [NHANES]    Frequency of Communication with Friends and Family: More than three times a week    Frequency of Social Gatherings with Friends and Family: Twice a week    Attends Religious Services: Never    Database administrator or Organizations: No    Attends Engineer, structural: Never    Marital Status: Not on file   Family History  Problem Relation Age of Onset   Hypertension Maternal Aunt    Diabetes Maternal Aunt    Scheduled Meds:  Chlorhexidine Gluconate Cloth  6 each Topical QHS   leptospermum manuka honey  1 Application Topical QHS   pantoprazole (PROTONIX) IV  40 mg Intravenous Q24H   Continuous Infusions:  ampicillin-sulbactam (UNASYN) IV Stopped (04/26/23 0630)   PRN Meds:.docusate sodium, mouth rinse, polyethylene glycol Medications Prior to Admission:  Prior to Admission medications   Medication Sig Start Date End Date Taking? Authorizing Provider  acetaminophen (TYLENOL) 325 MG tablet Take 650 mg by mouth every 6 (six) hours as needed.   Yes [provider]  ibuprofen (ADVIL) 200 MG tablet Take 400  mg by mouth every 6 (six) hours as needed for headache or mild pain.   Yes [provider]  memantine (NAMENDA) 10 MG tablet Take 1 tablet (10 mg at night) for 2 weeks, then increase to 1 tablet (10 mg) twice a day Patient taking differently: Take 10 mg by mouth See admin instructions. Take 1 tablet (10 mg at night) for 2 weeks, then increase to 1 tablet (10 mg) twice a day 09/07/21  Yes Wertman, Sung Amabile, PA-C   No Known Allergies  Review of Systems +weakness  Physical Exam Elderly lady awake alert Wearing mittens Answers questions smiles verbalizes some Monitor noted  Vital Signs: BP (!) 116/59 (BP Location: Left Arm)   Pulse 61   Temp 97.7 F (36.5 C) (Bladder)   Resp 10   Ht 5' (1.524 m)   Wt 38.5 kg   SpO2 100%   BMI 16.58 kg/m  Pain Scale: CPOT   Pain Score: Asleep   SpO2: SpO2: 100 % O2 Device:SpO2: 100 % O2 Flow Rate: .O2 Flow Rate (L/min): 3 L/min  IO: Intake/output summary:  Intake/Output Summary (Last 24 hours) at 04/26/2023 1406 Last data filed at 04/26/2023 0646 Gross per 24 hour  Intake 1439.44 ml  Output 525 ml  Net 914.44 ml    LBM: Last BM Date :  (PTA) Baseline Weight: Weight: 32.7 kg Most recent weight: Weight: 38.5 kg     Palliative Assessment/Data:   PPS 50%  Time In:  1300 Time Out:  1400 Time Total:  60  Greater than 50%  of this time was spent counseling and coordinating care related to the above assessment and plan.  Signed by: Rosalin Hawking, MD   Please contact Palliative Medicine Team phone at [phone] for questions and concerns.  For individual provider: See Loretha Stapler

## 2023-04-27 DIAGNOSIS — R579 Shock, unspecified: Secondary | ICD-10-CM | POA: Diagnosis not present

## 2023-04-27 DIAGNOSIS — R531 Weakness: Secondary | ICD-10-CM

## 2023-04-27 DIAGNOSIS — Z7189 Other specified counseling: Secondary | ICD-10-CM | POA: Diagnosis not present

## 2023-04-27 DIAGNOSIS — Z515 Encounter for palliative care: Secondary | ICD-10-CM | POA: Diagnosis not present

## 2023-04-27 LAB — CBC
HCT: 32.5 % — ABNORMAL LOW (ref 36.0–46.0)
Hemoglobin: 10 g/dL — ABNORMAL LOW (ref 12.0–15.0)
MCH: 30.7 pg (ref 26.0–34.0)
MCHC: 30.8 g/dL (ref 30.0–36.0)
MCV: 99.7 fL (ref 80.0–100.0)
Platelets: 147 K/uL — ABNORMAL LOW (ref 150–400)
RBC: 3.26 MIL/uL — ABNORMAL LOW (ref 3.87–5.11)
RDW: 20 % — ABNORMAL HIGH (ref 11.5–15.5)
WBC: 13.8 K/uL — ABNORMAL HIGH (ref 4.0–10.5)
nRBC: 0.1 % (ref 0.0–0.2)

## 2023-04-27 LAB — BASIC METABOLIC PANEL WITH GFR
Anion gap: 8 (ref 5–15)
BUN: 22 mg/dL (ref 8–23)
CO2: 25 mmol/L (ref 22–32)
Calcium: 8.2 mg/dL — ABNORMAL LOW (ref 8.9–10.3)
Chloride: 110 mmol/L (ref 98–111)
Creatinine, Ser: 0.36 mg/dL — ABNORMAL LOW (ref 0.44–1.00)
GFR, Estimated: >60 mL/min (ref >60)
Glucose, Bld: 233 mg/dL — ABNORMAL HIGH (ref 70–99)
Potassium: 3.3 mmol/L — ABNORMAL LOW (ref 3.5–5.1)
Sodium: 143 mmol/L (ref 135–145)

## 2023-04-27 LAB — GLUCOSE, RANDOM
Glucose, Bld: 248 mg/dL — ABNORMAL HIGH (ref 70–99)
Glucose, Bld: 203 mg/dL — ABNORMAL HIGH (ref 70–99)

## 2023-04-27 LAB — GLUCOSE, CAPILLARY
Glucose-Capillary: 46 mg/dL — ABNORMAL LOW (ref 70–99)
Glucose-Capillary: 54 mg/dL — ABNORMAL LOW (ref 70–99)
Glucose-Capillary: 135 mg/dL — ABNORMAL HIGH (ref 70–99)
Glucose-Capillary: 45 mg/dL — ABNORMAL LOW (ref 70–99)

## 2023-04-27 LAB — CORTISOL: Cortisol, Plasma: 20.8 ug/dL

## 2023-04-27 LAB — MAGNESIUM: Magnesium: 1.9 mg/dL (ref 1.7–2.4)

## 2023-04-27 MED ORDER — DEXTROSE 50 % IV SOLN
1.0000 | INTRAVENOUS | Status: DC | PRN
  Administered 2023-04-27: 50 mL via INTRAVENOUS

## 2023-04-27 MED ORDER — ENSURE ENLIVE PO LIQD
237.0000 mL | Freq: Two times a day (BID) | ORAL | Status: DC
Start: 2023-04-27 — End: 2023-05-12
  Administered 2023-04-27 – 2023-05-12 (×26): 237 mL via ORAL

## 2023-04-27 MED ORDER — POTASSIUM CHLORIDE CRYS ER 20 MEQ PO TBCR
40.0000 meq | EXTENDED_RELEASE_TABLET | Freq: Once | ORAL | Status: AC
  Administered 2023-04-27: 40 meq via ORAL
  Filled 2023-04-27: qty 2

## 2023-04-27 MED ORDER — FOLIC ACID 1 MG PO TABS
1.0000 mg | ORAL_TABLET | Freq: Every day | ORAL | Status: DC
  Administered 2023-04-27 – 2023-05-12 (×16): 1 mg via ORAL
  Filled 2023-04-27 (×16): qty 1

## 2023-04-27 MED ORDER — ORAL CARE MOUTH RINSE
15.0000 mL | OROMUCOSAL | Status: DC
  Administered 2023-04-27 – 2023-04-30 (×15): 15 mL via OROMUCOSAL

## 2023-04-27 MED ORDER — DEXTROSE 50 % IV SOLN
INTRAVENOUS | Status: AC
Start: 2023-04-27 — End: 2023-04-27
  Filled 2023-04-27: qty 50

## 2023-04-27 NOTE — Progress Notes (Signed)
 PROGRESS NOTE    Patricia Davies  ZOX:[id number] DOB: 20-Jan-1946 DOA: 04/24/2023 PCP: [name], MD   Brief Narrative: TRH pickup 04/25/2023 Admitted by PCCM on 04/24/2023 This is a 78 year old  female lives at home with her daughter for the last 3 years due to advanced dementia.  She has a past medical history significant for sarcoidosis COPD hypertension, kidney stones B12 deficiency and dementia.  She has been struggling with dementia for the past 7 to 8 years.  Patient had gradually progressive decline in the past 3 years.  Patient has very poor appetite does not eat or drink and has had significant weight loss.  On arrival to the emergency room she was obtunded hypotensive tachycardic  On admission her sodium was 149 BUN was 59 creatinine 1.72, albumin 2.1, lactic acid 6.5, hemoglobin 7.9, platelets 132 COVID RSV and flu were negative. ABG 7. 4/37/196 Chest x-ray -  Increased left apical opacity and volume loss compared with prior studies from 4 years ago. Findings could be secondary to progressive scarring, superimposed infection or developing mass. Correlate clinically. CT may be helpful for further evaluation.Otherwise grossly stable chronic lung disease attributed to underlying sarcoidosis. CT head showed cerebral atrophy and no evidence of acute intracranial abnormality  Assessment & Plan:   Principal Problem:   Shock (HCC) Active Problems:   Severe dementia without behavioral disturbance, psychotic disturbance, mood disturbance, or anxiety (HCC)   Hypovolemia   Failure to thrive in adult   #1 Acute metabolic encephalopathy likely secondary to multifactorial issues in the setting of severe dementia, pneumonia, shock, dehydration. Mental status improving.  #2 acute on chronic macrocytic anemia-hemoglobin 6.5 down from 7.9 Partly due to hemodilution from all the fluids that she has received so far. She was positive over 8 L Transfused 1 unit of packed RBC on 04/25/2023,  hemoglobin 9.9 from 10 from 6.5 after transfusion. Folate level low B12 level normal Will start oral folate supplementation And continue B12  #3 AKI and hypernatremia resolved with IV fluids secondary to dehydration poor p.o. intake  #4 shock she presented obtunded initial blood pressure with a EMS was 50/60 responded to IV fluids improved  #5 severe sepsis present on admission likely due to pneumonia in the setting of sarcoidosis COPD and severe dementia and possible aspiration. She was hypotensive, tachycardic tachypneic and hypoxic with lactic acidosis in the ER. Dc cefepime 2/21 Started unasyn 2/21 MRSA PCR was negative Chest xray 2/21-Continued left upper lobe opacity is noted concerning for pneumonia, scarring or possibly underlying malignancy. CT scan of the chest is recommended for further evaluation as noted on prior exam. Stable probable right lung scarring.  #6 lactic acidosis  improved with IV fluids and antibiotics Lactic acid 4.3 from 4.1 from 10.8 on admission  #7 failure to thrive in the setting of advanced dementia.  Discussed with daughter who agrees for blood transfusion, DNR DNI.  #8 hypokalemia/hypophosphatemia repleted  #9 multiple pressure injuries present on admission seen by wound care possible Kennedy ulcers Per wound care-Sacrum wound is 100% tightly adhered eschar; Unstageable pressure injury; 5X6cm, Left hip is dark red-purple Deep tissue pressure injury; 6X5cm Right hip is Stage 3 pressure injury; 50% red, 30% yellow, 20% dark red-purple Deep tissue pressure injury to wound edges, 3.8X1.9cm Recommending-Apply Medihoney to sacrum and right hip wounds Q day then cover with foam dressing.  Change foam dressing Q 3 days or PRN soiling Apply Xeroform gauze to left hip wound Q day and cover with foam dressing.  Change foam dressing Q 3 days or PRN soiling.  Pressure Injury 04/24/23 Hip Left Deep Tissue Pressure Injury - Purple or maroon localized area of  discolored intact skin or blood-filled blister due to damage of underlying soft tissue from pressure and/or shear. (Active)  04/24/23 1806  Location: Hip  Location Orientation: Left  Staging: Deep Tissue Pressure Injury - Purple or maroon localized area of discolored intact skin or blood-filled blister due to damage of underlying soft tissue from pressure and/or shear.  Wound Description (Comments):   Present on Admission: Yes  Dressing Type Gauze (Comment) 04/27/23 0730     Pressure Injury 04/24/23 Hip Right Stage 3 -  Full thickness tissue loss. Subcutaneous fat may be visible but bone, tendon or muscle are NOT exposed. (Active)  04/24/23 1813  Location: Hip  Location Orientation: Right  Staging: Stage 3 -  Full thickness tissue loss. Subcutaneous fat may be visible but bone, tendon or muscle are NOT exposed.  Wound Description (Comments):   Present on Admission: Yes  Dressing Type Gauze (Comment) 04/27/23 0730     Pressure Injury 04/24/23 Sacrum Unstageable - Full thickness tissue loss in which the base of the injury is covered by slough (yellow, tan, gray, green or brown) and/or eschar (tan, brown or black) in the wound bed. (Active)  04/24/23 1821  Location: Sacrum  Location Orientation:   Staging: Unstageable - Full thickness tissue loss in which the base of the injury is covered by slough (yellow, tan, gray, green or brown) and/or eschar (tan, brown or black) in the wound bed.  Wound Description (Comments):   Present on Admission: Yes  Dressing Type Foam - Lift dressing to assess site every shift 04/27/23 0730     Estimated body mass index is 16.49 kg/m as calculated from the following:   Height as of this encounter: 5' (1.524 m).   Weight as of this encounter: 38.3 kg.  DVT prophylaxis: scd Code Status: dnr Family Communication:dw daughter Disposition Plan:  Status is: Inpatient Remains inpatient appropriate because: acute illness   Consultants:  pccm  Procedures:  none Antimicrobials:cefepime  Subjective:  Overnight events noted with blood sugar less than 10 however patient was awake unclear if that is truly true or not likely not she was given D10 drip intermittently this morning her blood sugar is back to normal she started on a mechanical soft diet She was awake smiling answering questions appropriately and she was oriented to place and was following commands and moving all extremities Objective: Vitals:   04/27/23 0800 04/27/23 0900 04/27/23 1000 04/27/23 1100  BP: 127/77 (!) 118/52 130/62 (!) 110/58  Pulse:      Resp: 13 (!) 9 13 12   Temp: (!) 96.4 F (35.8 C) (!) 96.4 F (35.8 C) (!) 96.4 F (35.8 C) (!) 96.3 F (35.7 C)  TempSrc:      SpO2:      Weight:      Height:        Intake/Output Summary (Last 24 hours) at 04/27/2023 1119 Last data filed at 04/27/2023 0900 Gross per 24 hour  Intake 990.32 ml  Output 500 ml  Net 490.32 ml   Filed Weights   04/24/23 1800 04/26/23 0500 04/27/23 0500  Weight: 34.2 kg 38.5 kg 38.3 kg    Examination:  General exam: Appears in no acute distress Respiratory system: Rhonchi left more than right  cardiovascular system: S1 & S2 heard, RRR. No JVD, murmurs, rubs, gallops or clicks. No pedal edema. Gastrointestinal  system: Abdomen is nondistended, soft and nontender. No organomegaly or masses felt. Normal bowel sounds heard. Central nervous system: Does not follow commands or answer questions Extremities: Moves all extremities   Data Reviewed: I have personally reviewed following labs and imaging studies  CBC: Recent Labs  Lab 04/24/23 1250 04/25/23 0321 04/25/23 1831 04/26/23 0320 04/27/23 0301  WBC 7.5 8.0  --  11.6* 13.8*  NEUTROABS 5.6  --   --   --   --   HGB 7.9* 6.5* 10.0* 9.9* 10.0*  HCT 25.8* 20.7* 32.1* 31.8* 32.5*  MCV 103.6* 100.5*  --  99.4 99.7  PLT 132* 119*  --  135* 147*   Basic Metabolic Panel: Recent Labs  Lab 04/24/23 1052 04/25/23 0321 04/26/23 0320  04/26/23 2048 04/27/23 0301 04/27/23 0437 04/27/23 0741  NA 149* 145 143 141 143  --   --   K 4.2 3.1* 4.1 3.8 3.3*  --   --   CL 115* 106 108 108 110  --   --   CO2 24 26 24 26 25   --   --   GLUCOSE 151* 100* 89 292* 233* 248* 203*  BUN 59* 44* 33* 26* 22  --   --   CREATININE 1.72* 0.82 0.53 0.46 0.36*  --   --   CALCIUM 7.5* 7.8* 8.3* 8.0* 8.2*  --   --   MG  --  2.1  --   --   --   --   --   PHOS  --  2.0* 2.6  --   --   --   --    GFR: Estimated Creatinine Clearance: 36.7 mL/min (A) (by C-G formula based on SCr of 0.36 mg/dL (L)). Liver Function Tests: Recent Labs  Lab 04/24/23 1052 04/26/23 0320  AST 57* 71*  ALT 21 53*  ALKPHOS 26* 49  BILITOT 1.6* 4.0*  PROT 4.8* 5.4*  ALBUMIN 2.1* 2.5*   No results for input(s): "LIPASE", "AMYLASE" in the last 168 hours. Recent Labs  Lab 04/24/23 1600  AMMONIA 13   Coagulation Profile: Recent Labs  Lab 04/24/23 1250  INR 1.4*   Cardiac Enzymes: No results for input(s): "CKTOTAL", "CKMB", "CKMBINDEX", "TROPONINI" in the last 168 hours. BNP (last 3 results) No results for input(s): "PROBNP" in the last 8760 hours. HbA1C: No results for input(s): "HGBA1C" in the last 72 hours. CBG: Recent Labs  Lab 04/26/23 2333 04/27/23 0158 04/27/23 0200 04/27/23 0231 04/27/23 0334  GLUCAP 109* 54* 46* 135* 45*   Lipid Profile: No results for input(s): "CHOL", "HDL", "LDLCALC", "TRIG", "CHOLHDL", "LDLDIRECT" in the last 72 hours. Thyroid Function Tests: Recent Labs    04/24/23 1600  TSH 1.058   Anemia Panel: Recent Labs    04/25/23 1831  VITAMINB12 596  FOLATE 3.2*   Sepsis Labs: Recent Labs  Lab 04/24/23 1058 04/24/23 1524 04/24/23 1600 04/24/23 1827  LATICACIDVEN 6.5* 10.8* 4.1* 4.3*    Recent Results (from the past 240 hours)  Resp panel by RT-PCR (RSV, Flu A&B, Covid) Anterior Nasal Swab     Status: None   Collection Time: 04/24/23 10:41 AM   Specimen: Anterior Nasal Swab  Result Value Ref Range Status    SARS Coronavirus 2 by RT PCR NEGATIVE NEGATIVE Final    Comment: (NOTE) SARS-CoV-2 target nucleic acids are NOT DETECTED.  The SARS-CoV-2 RNA is generally detectable in upper respiratory specimens during the acute phase of infection. The lowest concentration of SARS-CoV-2 viral copies this assay  can detect is 138 copies/mL. A negative result does not preclude SARS-Cov-2 infection and should not be used as the sole basis for treatment or other patient management decisions. A negative result may occur with  improper specimen collection/handling, submission of specimen other than nasopharyngeal swab, presence of viral mutation(s) within the areas targeted by this assay, and inadequate number of viral copies(<138 copies/mL). A negative result must be combined with clinical observations, patient history, and epidemiological information. The expected result is Negative.  Fact Sheet for Patients:  BloggerCourse.com  Fact Sheet for Healthcare Providers:  SeriousBroker.it  This test is no t yet approved or cleared by the [place] FDA and  has been authorized for detection and/or diagnosis of SARS-CoV-2 by FDA under an Emergency Use Authorization (EUA). This EUA will remain  in effect (meaning this test can be used) for the duration of the COVID-19 declaration under Section 564(b)(1) of the Act, 21 U.S.C.section 360bbb-3(b)(1), unless the authorization is terminated  or revoked sooner.       Influenza A by PCR NEGATIVE NEGATIVE Final   Influenza B by PCR NEGATIVE NEGATIVE Final    Comment: (NOTE) The Xpert Xpress SARS-CoV-2/FLU/RSV plus assay is intended as an aid in the diagnosis of influenza from Nasopharyngeal swab specimens and should not be used as a sole basis for treatment. Nasal washings and aspirates are unacceptable for Xpert Xpress SARS-CoV-2/FLU/RSV testing.  Fact Sheet for  Patients: BloggerCourse.com  Fact Sheet for Healthcare Providers: SeriousBroker.it  This test is not yet approved or cleared by the [place] FDA and has been authorized for detection and/or diagnosis of SARS-CoV-2 by FDA under an Emergency Use Authorization (EUA). This EUA will remain in effect (meaning this test can be used) for the duration of the COVID-19 declaration under Section 564(b)(1) of the Act, 21 U.S.C. section 360bbb-3(b)(1), unless the authorization is terminated or revoked.     Resp Syncytial Virus by PCR NEGATIVE NEGATIVE Final    Comment: (NOTE) Fact Sheet for Patients: BloggerCourse.com  Fact Sheet for Healthcare Providers: SeriousBroker.it  This test is not yet approved or cleared by the [place] FDA and has been authorized for detection and/or diagnosis of SARS-CoV-2 by FDA under an Emergency Use Authorization (EUA). This EUA will remain in effect (meaning this test can be used) for the duration of the COVID-19 declaration under Section 564(b)(1) of the Act, 21 U.S.C. section 360bbb-3(b)(1), unless the authorization is terminated or revoked.  Performed at [facility], [address], [place], [place] [address]   Blood Culture (routine x 2)     Status: None (Preliminary result)   Collection Time: 04/24/23 10:50 AM   Specimen: BLOOD  Result Value Ref Range Status   Specimen Description   Final    BLOOD RIGHT ANTECUBITAL Performed at [facility], [address] [place], [place] [address]    Special Requests   Final    BOTTLES DRAWN AEROBIC ONLY Blood Culture results may not be optimal due to an inadequate volume of blood received in culture bottles Performed at [facility], [address] [place], [place] [address]    Culture   Final    NO GROWTH 3 DAYS Performed at [facility]  Lab, [address], [place], [place] [address]    Report Status PENDING  Incomplete  Blood Culture (routine x 2)     Status: None (Preliminary result)   Collection Time: 04/24/23  4:00 PM   Specimen: BLOOD RIGHT ARM  Result Value Ref Range Status   Specimen Description   Final    BLOOD RIGHT ARM Performed at [facility], [address] [place], [place] [address]    Special Requests   Final    BOTTLES DRAWN AEROBIC ONLY Blood Culture results may not be optimal due to an inadequate volume of blood received in culture bottles Performed at [facility], [address] [place], [place] [address]    Culture   Final    NO GROWTH 3 DAYS Performed at [facility], [address], [place], [place] [address]    Report Status PENDING  Incomplete  MRSA Next Gen by PCR, Nasal     Status: None   Collection Time: 04/25/23  4:32 PM   Specimen: Nasal Mucosa; Nasal Swab  Result Value Ref Range Status   MRSA by PCR Next Gen NOT DETECTED NOT DETECTED Final    Comment: (NOTE) The GeneXpert MRSA Assay (FDA approved for NASAL specimens only), is one component of a comprehensive MRSA colonization surveillance program. It is not intended to diagnose MRSA infection nor to guide or monitor treatment for MRSA infections. Test performance is not FDA approved in patients less than 25 years old. Performed at [facility], [address] [place], [place] [address]          Radiology Studies: No results found.       Scheduled Meds:  Chlorhexidine Gluconate Cloth  6 each Topical QHS   feeding supplement  237 mL Oral BID BM   leptospermum manuka honey  1 Application Topical QHS   mouth rinse  15 mL Mouth Rinse 4 times per day   pantoprazole (PROTONIX) IV  40 mg Intravenous Q24H   Continuous Infusions:  ampicillin-sulbactam (UNASYN) IV Stopped (04/27/23 0627)   dextrose 100 mL/hr at 04/27/23 0900     LOS: 3 days   Alwyn Ren, MD  04/27/2023, 11:19 AM

## 2023-04-27 NOTE — Evaluation (Signed)
 Clinical/Bedside Swallow Evaluation Patient Details  Name: Patricia Davies MRN: [id number] Date of Birth: [birth year]  Today's Date: 04/27/2023 Time: SLP Start Time (ACUTE ONLY): 1445 SLP Stop Time (ACUTE ONLY): 1500 SLP Time Calculation (min) (ACUTE ONLY): 15 min  Past Medical History:  Past Medical History:  Diagnosis Date   COPD (chronic obstructive pulmonary disease) (HCC)    History of nephrolithiasis    Hypertension    Sarcoidosis of skin    Vitamin B12 deficiency    Past Surgical History:  Past Surgical History:  Procedure Laterality Date   ABDOMINAL HYSTERECTOMY     CHOLECYSTECTOMY     HPI:  Patient is a 78 y.o. female with PMH: advanced dementia (lives at home with daughter), sarcoidosis, COPD, HTN, kidney stones, B12 deficiency. She has a very poor appetiteand prior to admission, daughter reported that she was not eating or drinking anything. She presented to the hospital on 04/24/23 and was obtunded, hypotensive and tachycardic. CXR showed left apical opacity and volume loss compared to prior studies from four years ago. CT head showed cerebral atrophy and no evidence of acute intracranial abnormality. SLP swallow evaluation ordered on 04/26/23 to determine safest possible PO diet.    Assessment / Plan / Recommendation  Clinical Impression  Patient is not presenting with clinical s/s of dysphagia as per this bedside swallow evaluation. Her daughter who was in the room, reported that NT was able to get patient to eat some lunch today (green beans, mashed potatoes) but she couldnt chew the meat. (dys 3-mechanical soft) Patient was receptive to straw sips of thin liquids (gingerale), and every time SLP brought straw to lips, she actively drank and took multiple sips each time. She exhibited one instance of delayed, dry sounding cough but otherwise appears to tolerate thin liquids without difficulty. SLP briefly discussed PO consistencies of dys 2 (minced) and dys 3 (mechanical soft) and  all in agreement to continue with dys 3 solids and family and staff can help order foods that she is able to tolerate. SLP recommends to continue to offer patient PO's that she enjoys and to encourage PO intake. She does not seem to request PO's but when they are presented to her she does exhibit interest. No further skilled SLP services warranted at this time. SLP Visit Diagnosis: Dysphagia, unspecified (R13.10)    Aspiration Risk  Mild aspiration risk    Diet Recommendation Dysphagia 3 (Mech soft);Thin liquid    Liquid Administration via: Cup;Straw Medication Administration: Other (Comment) (as tolerated) Supervision: Full supervision/cueing for compensatory strategies;Staff to assist with self feeding Compensations: Slow rate;Small sips/bites Postural Changes: Seated upright at 90 degrees    Other  Recommendations Oral Care Recommendations: Oral care BID;Staff/trained caregiver to provide oral care    Recommendations for follow up therapy are one component of a multi-disciplinary discharge planning process, led by the attending physician.  Recommendations may be updated based on patient status, additional functional criteria and insurance authorization.  Follow up Recommendations No SLP follow up      Assistance Recommended at Discharge    Functional Status Assessment Patient has not had a recent decline in their functional status  Frequency and Duration   N/A         Prognosis   N/A     Swallow Study   General Date of Onset: 04/26/23 HPI: Patient is a 78 y.o. female with PMH: advanced dementia (lives at home with daughter), sarcoidosis, COPD, HTN, kidney stones, B12 deficiency. She has a very poor  appetiteand prior to admission, daughter reported that she was not eating or drinking anything. She presented to the hospital on 04/24/23 and was obtunded, hypotensive and tachycardic. CXR showed left apical opacity and volume loss compared to prior studies from four years ago. CT head  showed cerebral atrophy and no evidence of acute intracranial abnormality. SLP swallow evaluation ordered on 04/26/23 to determine safest possible PO diet. Type of Study: Bedside Swallow Evaluation Previous Swallow Assessment: none found Diet Prior to this Study: Dysphagia 3 (mechanical soft);Thin liquids (Level 0) Temperature Spikes Noted: No Respiratory Status: Room air History of Recent Intubation: No Behavior/Cognition: Alert;Cooperative;Pleasant mood;Requires cueing Oral Cavity Assessment: Within Functional Limits Oral Care Completed by SLP: No Oral Cavity - Dentition: Edentulous Self-Feeding Abilities: Total assist Patient Positioning: Upright in bed Baseline Vocal Quality: Low vocal intensity Volitional Cough: Cognitively unable to elicit Volitional Swallow: Unable to elicit    Oral/Motor/Sensory Function Overall Oral Motor/Sensory Function: Other (comment) (not able to follow commands for OME but no focal weakness or asymmetry)   Ice Chips     Thin Liquid Thin Liquid: Within functional limits Presentation: Straw    Nectar Thick     Honey Thick     Puree Puree: Not tested   Solid     Solid: Not tested      Angela Nevin, MA, CCC-SLP Speech Therapy

## 2023-04-27 NOTE — Plan of Care (Signed)

## 2023-04-27 NOTE — Progress Notes (Signed)
 Daily Progress Note   Patient Name: Patricia Davies       Date: 04/27/2023 DOB: 1945/08/19  Age: 78 y.o. MRN#: [id number] Attending Physician: Alwyn Ren, MD Primary Care Physician: [name], MD Admit Date: 04/24/2023  Reason for Consultation/Follow-up: Establishing goals of care  Subjective: Resting in bed, monitor noted.   Length of Stay: 3  Current Medications: Scheduled Meds:   Chlorhexidine Gluconate Cloth  6 each Topical QHS   feeding supplement  237 mL Oral BID BM   leptospermum manuka honey  1 Application Topical QHS   mouth rinse  15 mL Mouth Rinse 4 times per day   pantoprazole (PROTONIX) IV  40 mg Intravenous Q24H    Continuous Infusions:  ampicillin-sulbactam (UNASYN) IV Stopped (04/27/23 0627)   dextrose 100 mL/hr at 04/27/23 0900    PRN Meds: dextrose, docusate sodium, mouth rinse, polyethylene glycol  Physical Exam         Resting in bed Monitor noted Has mittens on  Vital Signs: BP (!) 110/58   Pulse 60   Temp (!) 96.3 F (35.7 C)   Resp 12   Ht 5' (1.524 m)   Wt 38.3 kg   SpO2 100%   BMI 16.49 kg/m  SpO2: SpO2: 100 % O2 Device: O2 Device: Nasal Cannula O2 Flow Rate: O2 Flow Rate (L/min): 3 L/min  Intake/output summary:  Intake/Output Summary (Last 24 hours) at 04/27/2023 1113 Last data filed at 04/27/2023 0900 Gross per 24 hour  Intake 990.32 ml  Output 500 ml  Net 490.32 ml   LBM: Last BM Date :  (PTA) Baseline Weight: Weight: 32.7 kg Most recent weight: Weight: 38.3 kg       Palliative Assessment/Data:      Patient Active Problem List   Diagnosis Date Noted   Shock (HCC) 04/24/2023   Hypovolemia 04/24/2023   Failure to thrive in adult 04/24/2023   Chronic hypokalemia 08/22/2021   Dementia with behavioral  disturbance (HCC) 08/22/2021   Protein-calorie malnutrition, moderate (HCC) 11/11/2017   Routine general medical examination at a health care facility 11/11/2017   Severe dementia without behavioral disturbance, psychotic disturbance, mood disturbance, or anxiety (HCC) 12/02/2016   Mycetoma, unspecified 08/08/2016   Hyperlipidemia LDL goal <130 08/07/2016   Dietary folate deficiency anemia 06/01/2015   Vitamin B12 deficiency  neuropathy (HCC) 09/15/2014   COPD mixed type [facility]) 04/05/2011   Sarcoidosis 03/16/2010   Essential hypertension 03/16/2010    Palliative Care Assessment & Plan   Patient Profile:    Assessment:  78 year old lady with dementia, lives at home with daughter, has advanced dementia, sarcoidosis HTN kidney stones B12 deficiency. Patient with progressive decline for the past 3 years, poor appetite and weight loss.   Recommendations/Plan:  SLP recommendations are for dysphagia 3 noted, monitor PO intake and hospital course.  Continue current mode of care.     Code Status:    Code Status Orders  (From admission, onward)           Start     Ordered   04/24/23 1426  Do not attempt resuscitation (DNR)- Limited -Do Not Intubate (DNI)  Continuous       Question Answer Comment  If pulseless and not breathing No CPR or chest compressions.   In Pre-Arrest Conditions (Patient Is Breathing and Has A Pulse) Do not intubate. Provide all appropriate non-invasive medical interventions. Avoid ICU transfer unless indicated or required.   Consent: Discussion documented in EHR or advanced directives reviewed      04/24/23 1428           Code Status History     Date Active Date Inactive Code Status Order ID Comments User Context   04/24/2023 1117 04/24/2023 1428 Do not attempt resuscitation (DNR) PRE-ARREST INTERVENTIONS DESIRED [id number]  Coral Spikes, DO ED       Prognosis:  guarded  Discharge Planning: To Be Determined  Care plan was discussed with   IDT  Thank you for allowing the Palliative Medicine Team to assist in the care of this patient. Low MDM     Greater than 50%  of this time was spent counseling and coordinating care related to the above assessment and plan.  Rosalin Hawking, MD  Please contact Palliative Medicine Team phone at [phone] for questions and concerns.

## 2023-04-27 NOTE — Plan of Care (Signed)
  Problem: Clinical Measurements: Goal: Ability to maintain clinical measurements within normal limits will improve Outcome: Progressing Goal: Will remain free from infection Outcome: Progressing Goal: Diagnostic test results will improve Outcome: Progressing Goal: Respiratory complications will improve Outcome: Progressing Goal: Cardiovascular complication will be avoided Outcome: Progressing   Problem: Education: Goal: Knowledge of General Education information will improve Description: Including pain rating scale, medication(s)/side effects and non-pharmacologic comfort measures Outcome: Not Progressing   Problem: Health Behavior/Discharge Planning: Goal: Ability to manage health-related needs will improve Outcome: Not Progressing   Problem: Nutrition: Goal: Adequate nutrition will be maintained Outcome: Not Progressing   Problem: Coping: Goal: Level of anxiety will decrease Outcome: Not Progressing

## 2023-04-28 DIAGNOSIS — R579 Shock, unspecified: Secondary | ICD-10-CM | POA: Diagnosis not present

## 2023-04-28 LAB — CBC
HCT: 31.4 % — ABNORMAL LOW (ref 36.0–46.0)
Hemoglobin: 9.8 g/dL — ABNORMAL LOW (ref 12.0–15.0)
MCH: 31.4 pg (ref 26.0–34.0)
MCHC: 31.2 g/dL (ref 30.0–36.0)
MCV: 100.6 fL — ABNORMAL HIGH (ref 80.0–100.0)
Platelets: 174 10*3/uL (ref 150–400)
RBC: 3.12 MIL/uL — ABNORMAL LOW (ref 3.87–5.11)
RDW: 18.9 % — ABNORMAL HIGH (ref 11.5–15.5)
WBC: 13.6 10*3/uL — ABNORMAL HIGH (ref 4.0–10.5)
nRBC: 0 % (ref 0.0–0.2)

## 2023-04-28 LAB — TYPE AND SCREEN
ABO/RH(D): B NEG
Antibody Screen: NEGATIVE
Unit division: 0

## 2023-04-28 LAB — BPAM RBC
Blood Product Expiration Date: 202502222359
ISSUE DATE / TIME: 202502211126
Unit Type and Rh: 1700

## 2023-04-28 LAB — COMPREHENSIVE METABOLIC PANEL
ALT: 39 U/L (ref 0–44)
AST: 39 U/L (ref 15–41)
Albumin: 2.3 g/dL — ABNORMAL LOW (ref 3.5–5.0)
Alkaline Phosphatase: 40 U/L (ref 38–126)
Anion gap: 8 (ref 5–15)
BUN: 14 mg/dL (ref 8–23)
CO2: 25 mmol/L (ref 22–32)
Calcium: 8.6 mg/dL — ABNORMAL LOW (ref 8.9–10.3)
Chloride: 109 mmol/L (ref 98–111)
Creatinine, Ser: 0.37 mg/dL — ABNORMAL LOW (ref 0.44–1.00)
GFR, Estimated: >60 mL/min (ref >60)
Glucose, Bld: 90 mg/dL (ref 70–99)
Potassium: 3.8 mmol/L (ref 3.5–5.1)
Sodium: 142 mmol/L (ref 135–145)
Total Bilirubin: 1.5 mg/dL — ABNORMAL HIGH (ref 0.0–1.2)
Total Protein: 5.2 g/dL — ABNORMAL LOW (ref 6.5–8.1)

## 2023-04-28 MED ORDER — SODIUM CHLORIDE 0.9 % IV SOLN: INTRAVENOUS | Status: DC

## 2023-04-28 MED ORDER — ACETAMINOPHEN 325 MG PO TABS
650.0000 mg | ORAL_TABLET | Freq: Once | ORAL | Status: AC | PRN
  Administered 2023-04-28: 650 mg via ORAL
  Filled 2023-04-28: qty 2

## 2023-04-28 MED ORDER — MIDODRINE HCL 5 MG PO TABS
5.0000 mg | ORAL_TABLET | Freq: Three times a day (TID) | ORAL | Status: DC
  Administered 2023-04-28 – 2023-05-02 (×13): 5 mg via ORAL
  Filled 2023-04-28 (×13): qty 1

## 2023-04-28 NOTE — Progress Notes (Signed)
 PROGRESS NOTE    Patricia Davies  UUV:[id number] DOB: [birth year] DOA: 04/24/2023 PCP: [name], MD   Brief Narrative: TRH pickup 04/25/2023 Admitted by PCCM on 04/24/2023 This is a 78 year old  female lives at home with her daughter for the last 3 years due to advanced dementia.  She has a past medical history significant for sarcoidosis COPD hypertension, kidney stones B12 deficiency and dementia.  She has been struggling with dementia for the past 7 to 8 years.  Patient had gradually progressive decline in the past 3 years.  Patient has very poor appetite does not eat or drink and has had significant weight loss.  On arrival to the emergency room she was obtunded hypotensive tachycardic  On admission her sodium was 149 BUN was 59 creatinine 1.72, albumin 2.1, lactic acid 6.5, hemoglobin 7.9, platelets 132 COVID RSV and flu were negative. ABG 7. 4/37/196 Chest x-ray -  Increased left apical opacity and volume loss compared with prior studies from 4 years ago. Findings could be secondary to progressive scarring, superimposed infection or developing mass. Correlate clinically. CT may be helpful for further evaluation.Otherwise grossly stable chronic lung disease attributed to underlying sarcoidosis. CT head showed cerebral atrophy and no evidence of acute intracranial abnormality  Assessment & Plan:   Principal Problem:   Shock (HCC) Active Problems:   Severe dementia without behavioral disturbance, psychotic disturbance, mood disturbance, or anxiety (HCC)   Hypovolemia   Failure to thrive in adult   #1 Acute metabolic encephalopathy likely secondary to multifactorial issues in the setting of severe dementia, pneumonia, shock, dehydration. Mental status improving.  #2 acute on chronic macrocytic anemia-hemoglobin 6.5 down from 7.9 Partly due to hemodilution from all the fluids that she has received so far. She was positive over 8 L Transfused 1 unit of packed RBC on 04/25/2023,  hemoglobin 9.9 from 10 from 6.5 after transfusion. Folate level low B12 level normal Continue oral folate supplementation And continue B12  #3 AKI and hypernatremia resolved with IV fluids secondary to dehydration poor p.o. intake  #4 shock she presented obtunded initial blood pressure with a EMS was 50/60 responded to IV fluids improved  #5 severe sepsis present on admission likely due to pneumonia in the setting of sarcoidosis COPD and severe dementia and possible aspiration. She was hypotensive, tachycardic tachypneic and hypoxic with lactic acidosis in the ER. Dc cefepime 2/21 Started unasyn 2/21 MRSA PCR was negative Chest xray 2/21-Continued left upper lobe opacity is noted concerning for pneumonia, scarring or possibly underlying malignancy. CT scan of the chest is recommended for further evaluation as noted on prior exam. Stable probable right lung scarring.  #6 lactic acidosis  improved with IV fluids and antibiotics Lactic acid 4.3 from 4.1 from 10.8 on admission  #7 failure to thrive in the setting of advanced dementia.  Discussed with daughter DNR DNI.  #8 hypokalemia/hypophosphatemia repleted  #9 multiple pressure injuries present on admission seen by wound care possible Kennedy ulcers Per wound care-Sacrum wound is 100% tightly adhered eschar; Unstageable pressure injury; 5X6cm, Left hip is dark red-purple Deep tissue pressure injury; 6X5cm Right hip is Stage 3 pressure injury; 50% red, 30% yellow, 20% dark red-purple Deep tissue pressure injury to wound edges, 3.8X1.9cm Recommending-Apply Medihoney to sacrum and right hip wounds Q day then cover with foam dressing.  Change foam dressing Q 3 days or PRN soiling Apply Xeroform gauze to left hip wound Q day and cover with foam dressing.  Change foam dressing Q 3  days or PRN soiling.  Pressure Injury 04/24/23 Hip Left Deep Tissue Pressure Injury - Purple or maroon localized area of discolored intact skin or blood-filled  blister due to damage of underlying soft tissue from pressure and/or shear. (Active)  04/24/23 1806  Location: Hip  Location Orientation: Left  Staging: Deep Tissue Pressure Injury - Purple or maroon localized area of discolored intact skin or blood-filled blister due to damage of underlying soft tissue from pressure and/or shear.  Wound Description (Comments):   Present on Admission: Yes  Dressing Type Gauze (Comment) 04/27/23 2000     Pressure Injury 04/24/23 Hip Right Stage 3 -  Full thickness tissue loss. Subcutaneous fat may be visible but bone, tendon or muscle are NOT exposed. (Active)  04/24/23 1813  Location: Hip  Location Orientation: Right  Staging: Stage 3 -  Full thickness tissue loss. Subcutaneous fat may be visible but bone, tendon or muscle are NOT exposed.  Wound Description (Comments):   Present on Admission: Yes  Dressing Type Gauze (Comment) 04/27/23 2000     Pressure Injury 04/24/23 Sacrum Unstageable - Full thickness tissue loss in which the base of the injury is covered by slough (yellow, tan, gray, green or brown) and/or eschar (tan, brown or black) in the wound bed. (Active)  04/24/23 1821  Location: Sacrum  Location Orientation:   Staging: Unstageable - Full thickness tissue loss in which the base of the injury is covered by slough (yellow, tan, gray, green or brown) and/or eschar (tan, brown or black) in the wound bed.  Wound Description (Comments):   Present on Admission: Yes  Dressing Type Foam - Lift dressing to assess site every shift 04/27/23 2000     Estimated body mass index is 16.49 kg/m as calculated from the following:   Height as of this encounter: 5' (1.524 m).   Weight as of this encounter: 38.3 kg.  DVT prophylaxis: scd Code Status: dnr Family Communication:dw daughter Disposition Plan:  Status is: Inpatient Remains inpatient appropriate because: acute illness   Consultants:  pccm  Procedures:  none Antimicrobials:cefepime  Subjective:  Right upper ext swollen due to iv infiltration Objective: Vitals:   04/28/23 0400 04/28/23 0500 04/28/23 0600 04/28/23 0643  BP: (!) 92/55 (!) 94/49 (!) 104/57   Pulse:      Resp: 14 11 11  (!) 26  Temp: 99.1 F (37.3 C) 99.3 F (37.4 C) 98.4 F (36.9 C) 98.2 F (36.8 C)  TempSrc:      SpO2:      Weight:      Height:        Intake/Output Summary (Last 24 hours) at 04/28/2023 1003 Last data filed at 04/28/2023 0623 Gross per 24 hour  Intake 685.92 ml  Output 325 ml  Net 360.92 ml   Filed Weights   04/24/23 1800 04/26/23 0500 04/27/23 0500  Weight: 34.2 kg 38.5 kg 38.3 kg    Examination:  General exam: Appears in no acute distress Respiratory system: Rhonchi left more than right  cardiovascular system: S1 & S2 heard, RRR. No JVD, murmurs, rubs, gallops or clicks. No pedal edema. Gastrointestinal system: Abdomen is nondistended, soft and nontender. No organomegaly or masses felt. Normal bowel sounds heard. Central nervous system: Does not follow commands or answer questions Extremities: Moves all extremities   Data Reviewed: I have personally reviewed following labs and imaging studies  CBC: Recent Labs  Lab 04/24/23 1250 04/25/23 0321 04/25/23 1831 04/26/23 0320 04/27/23 0301  WBC 7.5 8.0  --  11.6* 13.8*  NEUTROABS 5.6  --   --   --   --   HGB 7.9* 6.5* 10.0* 9.9* 10.0*  HCT 25.8* 20.7* 32.1* 31.8* 32.5*  MCV 103.6* 100.5*  --  99.4 99.7  PLT 132* 119*  --  135* 147*   Basic Metabolic Panel: Recent Labs  Lab 04/24/23 1052 04/25/23 0321 04/26/23 0320 04/26/23 2048 04/27/23 0301 04/27/23 0437 04/27/23 0741  NA 149* 145 143 141 143  --   --   K 4.2 3.1* 4.1 3.8 3.3*  --   --   CL 115* 106 108 108 110  --   --   CO2 24 26 24 26 25   --   --   GLUCOSE 151* 100* 89 292* 233* 248* 203*  BUN 59* 44* 33* 26* 22  --   --   CREATININE 1.72* 0.82 0.53 0.46 0.36*  --   --   CALCIUM 7.5* 7.8* 8.3* 8.0* 8.2*  --    --   MG  --  2.1  --   --   --   --  1.9  PHOS  --  2.0* 2.6  --   --   --   --    GFR: Estimated Creatinine Clearance: 36.7 mL/min (A) (by C-G formula based on SCr of 0.36 mg/dL (L)). Liver Function Tests: Recent Labs  Lab 04/24/23 1052 04/26/23 0320  AST 57* 71*  ALT 21 53*  ALKPHOS 26* 49  BILITOT 1.6* 4.0*  PROT 4.8* 5.4*  ALBUMIN 2.1* 2.5*   No results for input(s): "LIPASE", "AMYLASE" in the last 168 hours. Recent Labs  Lab 04/24/23 1600  AMMONIA 13   Coagulation Profile: Recent Labs  Lab 04/24/23 1250  INR 1.4*   Cardiac Enzymes: No results for input(s): "CKTOTAL", "CKMB", "CKMBINDEX", "TROPONINI" in the last 168 hours. BNP (last 3 results) No results for input(s): "PROBNP" in the last 8760 hours. HbA1C: No results for input(s): "HGBA1C" in the last 72 hours. CBG: Recent Labs  Lab 04/26/23 2333 04/27/23 0158 04/27/23 0200 04/27/23 0231 04/27/23 0334  GLUCAP 109* 54* 46* 135* 45*   Lipid Profile: No results for input(s): "CHOL", "HDL", "LDLCALC", "TRIG", "CHOLHDL", "LDLDIRECT" in the last 72 hours. Thyroid Function Tests: No results for input(s): "TSH", "T4TOTAL", "FREET4", "T3FREE", "THYROIDAB" in the last 72 hours.  Anemia Panel: Recent Labs    04/25/23 1831  VITAMINB12 596  FOLATE 3.2*   Sepsis Labs: Recent Labs  Lab 04/24/23 1058 04/24/23 1524 04/24/23 1600 04/24/23 1827  LATICACIDVEN 6.5* 10.8* 4.1* 4.3*    Recent Results (from the past 240 hours)  Resp panel by RT-PCR (RSV, Flu A&B, Covid) Anterior Nasal Swab     Status: None   Collection Time: 04/24/23 10:41 AM   Specimen: Anterior Nasal Swab  Result Value Ref Range Status   SARS Coronavirus 2 by RT PCR NEGATIVE NEGATIVE Final    Comment: (NOTE) SARS-CoV-2 target nucleic acids are NOT DETECTED.  The SARS-CoV-2 RNA is generally detectable in upper respiratory specimens during the acute phase of infection. The lowest concentration of SARS-CoV-2 viral copies this assay can  detect is 138 copies/mL. A negative result does not preclude SARS-Cov-2 infection and should not be used as the sole basis for treatment or other patient management decisions. A negative result may occur with  improper specimen collection/handling, submission of specimen other than nasopharyngeal swab, presence of viral mutation(s) within the areas targeted by this assay, and inadequate number of viral copies(<138 copies/mL). A  negative result must be combined with clinical observations, patient history, and epidemiological information. The expected result is Negative.  Fact Sheet for Patients:  BloggerCourse.com  Fact Sheet for Healthcare Providers:  SeriousBroker.it  This test is no t yet approved or cleared by the [place] FDA and  has been authorized for detection and/or diagnosis of SARS-CoV-2 by FDA under an Emergency Use Authorization (EUA). This EUA will remain  in effect (meaning this test can be used) for the duration of the COVID-19 declaration under Section 564(b)(1) of the Act, 21 U.S.C.section 360bbb-3(b)(1), unless the authorization is terminated  or revoked sooner.       Influenza A by PCR NEGATIVE NEGATIVE Final   Influenza B by PCR NEGATIVE NEGATIVE Final    Comment: (NOTE) The Xpert Xpress SARS-CoV-2/FLU/RSV plus assay is intended as an aid in the diagnosis of influenza from Nasopharyngeal swab specimens and should not be used as a sole basis for treatment. Nasal washings and aspirates are unacceptable for Xpert Xpress SARS-CoV-2/FLU/RSV testing.  Fact Sheet for Patients: BloggerCourse.com  Fact Sheet for Healthcare Providers: SeriousBroker.it  This test is not yet approved or cleared by the [place] FDA and has been authorized for detection and/or diagnosis of SARS-CoV-2 by FDA under an Emergency Use Authorization (EUA). This EUA will remain in  effect (meaning this test can be used) for the duration of the COVID-19 declaration under Section 564(b)(1) of the Act, 21 U.S.C. section 360bbb-3(b)(1), unless the authorization is terminated or revoked.     Resp Syncytial Virus by PCR NEGATIVE NEGATIVE Final    Comment: (NOTE) Fact Sheet for Patients: BloggerCourse.com  Fact Sheet for Healthcare Providers: SeriousBroker.it  This test is not yet approved or cleared by the [place] FDA and has been authorized for detection and/or diagnosis of SARS-CoV-2 by FDA under an Emergency Use Authorization (EUA). This EUA will remain in effect (meaning this test can be used) for the duration of the COVID-19 declaration under Section 564(b)(1) of the Act, 21 U.S.C. section 360bbb-3(b)(1), unless the authorization is terminated or revoked.  Performed at [facility], [address], [place], [place] [address]   Blood Culture (routine x 2)     Status: None (Preliminary result)   Collection Time: 04/24/23 10:50 AM   Specimen: BLOOD  Result Value Ref Range Status   Specimen Description   Final    BLOOD RIGHT ANTECUBITAL Performed at [facility], [address], [place], [place] [address]    Special Requests   Final    BOTTLES DRAWN AEROBIC ONLY Blood Culture results may not be optimal due to an inadequate volume of blood received in culture bottles Performed at [facility], [address], [place], [place] [address]    Culture   Final    NO GROWTH 4 DAYS Performed at [facility], [address], [place], [place] [address]    Report Status PENDING  Incomplete  Blood Culture (routine x 2)     Status: None (Preliminary result)   Collection Time: 04/24/23  4:00 PM   Specimen: BLOOD RIGHT ARM  Result Value Ref Range Status   Specimen Description   Final    BLOOD RIGHT ARM Performed at [facility], [address], [place], [place] [address]    Special Requests   Final    BOTTLES DRAWN AEROBIC ONLY Blood Culture results may not be optimal due to an inadequate volume of blood received in culture bottles  Performed at [facility], [address], [place], [place] [address]    Culture   Final    NO GROWTH 4 DAYS Performed at [facility], [address], [place], [place] [address]    Report Status PENDING  Incomplete  MRSA Next Gen by PCR, Nasal     Status: None   Collection Time: 04/25/23  4:32 PM   Specimen: Nasal Mucosa; Nasal Swab  Result Value Ref Range Status   MRSA by PCR Next Gen NOT DETECTED NOT DETECTED Final    Comment: (NOTE) The GeneXpert MRSA Assay (FDA approved for NASAL specimens only), is one component of a comprehensive MRSA colonization surveillance program. It is not intended to diagnose MRSA infection nor to guide or monitor treatment for MRSA infections. Test performance is not FDA approved in patients less than 14 years old. Performed at [facility], [address], [place], [place] [address]          Radiology Studies: No results found.       Scheduled Meds:  Chlorhexidine Gluconate Cloth  6 each Topical QHS   feeding supplement  237 mL Oral BID BM   folic acid  1 mg Oral Daily   leptospermum manuka honey  1 Application Topical QHS   mouth rinse  15 mL Mouth Rinse 4 times per day   Continuous Infusions:  ampicillin-sulbactam (UNASYN) IV Stopped (04/28/23 0719)     LOS: 4 days   Alwyn Ren, MD  04/28/2023, 10:03 AM

## 2023-04-28 NOTE — Progress Notes (Signed)
 Patient's diet changed to dysphagia 2 due to patient not having any teeth. Foods on meal trays were too hard for patient to chew.

## 2023-04-28 NOTE — Progress Notes (Signed)
 Daily Progress Note   Patient Name: Patricia Davies       Date: 04/28/2023 DOB: 26-Oct-1945  Age: 78 y.o. MRN#: [id number] Attending Physician: Alwyn Ren, MD Primary Care Physician: [name], MD Admit Date: 04/24/2023  Reason for Consultation/Follow-up: Establishing goals of care  Subjective: Resting in bed, awake alert, answers a few questions appropriately, no distress, now on Dysphagia 2. Denies feeling hungry.   Length of Stay: 4  Current Medications: Scheduled Meds:   Chlorhexidine Gluconate Cloth  6 each Topical QHS   feeding supplement  237 mL Oral BID BM   folic acid  1 mg Oral Daily   leptospermum manuka honey  1 Application Topical QHS   mouth rinse  15 mL Mouth Rinse 4 times per day    Continuous Infusions:  ampicillin-sulbactam (UNASYN) IV Stopped (04/28/23 0719)    PRN Meds: dextrose, docusate sodium, mouth rinse, polyethylene glycol  Physical Exam         Resting in bed Monitor noted Has mittens on  Vital Signs: BP 106/63   Pulse 72   Temp (!) 97.3 F (36.3 C)   Resp 10   Ht 5' (1.524 m)   Wt 38.3 kg   SpO2 95%   BMI 16.49 kg/m  SpO2: SpO2: 95 % O2 Device: O2 Device: Room Air O2 Flow Rate: O2 Flow Rate (L/min): 3 L/min  Intake/output summary:  Intake/Output Summary (Last 24 hours) at 04/28/2023 1213 Last data filed at 04/28/2023 1030 Gross per 24 hour  Intake 805.92 ml  Output 325 ml  Net 480.92 ml   LBM: Last BM Date :  (pt does not remember) Baseline Weight: Weight: 32.7 kg Most recent weight: Weight: 38.3 kg       Palliative Assessment/Data:      Patient Active Problem List   Diagnosis Date Noted   Shock (HCC) 04/24/2023   Hypovolemia 04/24/2023   Failure to thrive in adult 04/24/2023   Chronic hypokalemia 08/22/2021    Dementia with behavioral disturbance (HCC) 08/22/2021   Protein-calorie malnutrition, moderate (HCC) 11/11/2017   Routine general medical examination at a health care facility 11/11/2017   Severe dementia without behavioral disturbance, psychotic disturbance, mood disturbance, or anxiety (HCC) 12/02/2016   Mycetoma, unspecified 08/08/2016   Hyperlipidemia LDL goal <130 08/07/2016   Dietary folate deficiency  anemia 06/01/2015   Vitamin B12 deficiency neuropathy (HCC) 09/15/2014   COPD mixed type [facility]) 04/05/2011   Sarcoidosis 03/16/2010   Essential hypertension 03/16/2010    Palliative Care Assessment & Plan   Patient Profile:    Assessment:  78 year old lady with dementia, lives at home with daughter, has advanced dementia, sarcoidosis HTN kidney stones B12 deficiency. Patient with progressive decline for the past 3 years, poor appetite and weight loss.   Recommendations/Plan:  SLP recommendations are for dysphagia 2 noted, monitor PO intake and hospital course.  Continue current mode of care.  PMT recommends home health+outpatient palliative versus home with hospice depending on patient's hospital course, continue to monitor.     Code Status:    Code Status Orders  (From admission, onward)           Start     Ordered   04/24/23 1426  Do not attempt resuscitation (DNR)- Limited -Do Not Intubate (DNI)  Continuous       Question Answer Comment  If pulseless and not breathing No CPR or chest compressions.   In Pre-Arrest Conditions (Patient Is Breathing and Has A Pulse) Do not intubate. Provide all appropriate non-invasive medical interventions. Avoid ICU transfer unless indicated or required.   Consent: Discussion documented in EHR or advanced directives reviewed      04/24/23 1428           Code Status History     Date Active Date Inactive Code Status Order ID Comments User Context   04/24/2023 1117 04/24/2023 1428 Do not attempt resuscitation (DNR) PRE-ARREST  INTERVENTIONS DESIRED [id number]  Coral Spikes, DO ED       Prognosis:  guarded  Discharge Planning: To Be Determined  Care plan was discussed with patient.   Thank you for allowing the Palliative Medicine Team to assist in the care of this patient. mod MDM     Greater than 50%  of this time was spent counseling and coordinating care related to the above assessment and plan.  Rosalin Hawking, MD  Please contact Palliative Medicine Team phone at [phone] for questions and concerns.

## 2023-04-29 ENCOUNTER — Inpatient Hospital Stay (HOSPITAL_COMMUNITY): Payer: Medicare PPO

## 2023-04-29 DIAGNOSIS — R579 Shock, unspecified: Secondary | ICD-10-CM | POA: Diagnosis not present

## 2023-04-29 LAB — CULTURE, BLOOD (ROUTINE X 2)
Culture: NO GROWTH
Culture: NO GROWTH

## 2023-04-29 MED ORDER — HEPARIN SODIUM (PORCINE) 5000 UNIT/ML IJ SOLN
5000.0000 [IU] | Freq: Three times a day (TID) | INTRAMUSCULAR | Status: DC
  Administered 2023-04-29 – 2023-05-01 (×7): 5000 [IU] via SUBCUTANEOUS
  Filled 2023-04-29 (×7): qty 1

## 2023-04-29 MED ORDER — MEMANTINE HCL 10 MG PO TABS
10.0000 mg | ORAL_TABLET | Freq: Two times a day (BID) | ORAL | Status: DC
  Administered 2023-04-29 – 2023-05-12 (×26): 10 mg via ORAL
  Filled 2023-04-29 (×4): qty 1
  Filled 2023-04-29: qty 2
  Filled 2023-04-29: qty 1
  Filled 2023-04-29: qty 2
  Filled 2023-04-29 (×2): qty 1
  Filled 2023-04-29: qty 2
  Filled 2023-04-29 (×3): qty 1
  Filled 2023-04-29: qty 2
  Filled 2023-04-29 (×3): qty 1
  Filled 2023-04-29: qty 2
  Filled 2023-04-29 (×5): qty 1
  Filled 2023-04-29: qty 2
  Filled 2023-04-29 (×2): qty 1

## 2023-04-29 NOTE — Plan of Care (Signed)
  Problem: Pain Managment: Goal: General experience of comfort will improve and/or be controlled Outcome: Progressing   Problem: Fluid Volume: Goal: Hemodynamic stability will improve Outcome: Progressing   Problem: Clinical Measurements: Goal: Ability to maintain a body temperature in the normal range will improve Outcome: Progressing

## 2023-04-29 NOTE — Evaluation (Signed)
 Physical Therapy Evaluation Patient Details Name: Patricia Davies MRN: [id number] DOB: Oct 17, 1945 Today's Date: 04/29/2023  History of Present Illness  78 year old  female  with Acute metabolic encephalopathy likely secondary to multifactorial issues in the setting of severe dementia, pneumonia, shock, dehydration. pt also with multiple pressure injuries on admission.  PMH: sarcoidosis COPD hypertension, kidney stones B12 deficiency and dementia.  Clinical Impression  Pt admitted with above diagnosis.  Unsure of pt baseline, was home with dtr prior to admission; pt follows some basic one step commands; possibly pt was able to transfer bed to chair based on clinical observation/presentation (?) recommend HHPT if home with dtr vs SNF/LTC. Anticipate rehab potential to be limited given dementia   Pt currently with functional limitations due to the deficits listed below (see PT Problem List). Pt will benefit from acute skilled PT to increase their independence and safety with mobility to allow discharge.           If plan is discharge home, recommend the following: A lot of help with bathing/dressing/bathroom;A lot of help with walking and/or transfers;Assistance with cooking/housework;Assist for transportation;Help with stairs or ramp for entrance;Direct supervision/assist for medications management   Can travel by private vehicle        Equipment Recommendations Other (comment) (?hospital bed with air mattress)  Recommendations for Other Services       Functional Status Assessment Patient has had a recent decline in their functional status and demonstrates the ability to make significant improvements in function in a reasonable and predictable amount of time.     Precautions / Restrictions Precautions Precautions: Fall      Mobility  Bed Mobility Overal bed mobility: Needs Assistance Bed Mobility: Supine to Sit, Sit to Supine     Supine to sit: +2 for safety/equipment, +2 for  physical assistance, Total assist Sit to supine: +2 for physical assistance, +2 for safety/equipment   General bed mobility comments: assist for trunk and LEs    Transfers                        Ambulation/Gait                  Stairs            Wheelchair Mobility     Tilt Bed    Modified Rankin (Stroke Patients Only)       Balance Overall balance assessment: Needs assistance Sitting-balance support: Feet unsupported, No upper extremity supported Sitting balance-Leahy Scale: Poor Sitting balance - Comments: pt briefly able to maintain sitting while placing L UE on therapist hand. attempted to scoot pt slightly closer to EOB, pt with posterior LOB (assisted back to supine for pt safety)                                     Pertinent Vitals/Pain Pain Assessment Pain Assessment: Faces Faces Pain Scale: Hurts a little bit Pain Location: diffuse, pt unable to  specify, grimaces with movement right side more so than L Pain Descriptors / Indicators: Grimacing Pain Intervention(s): Limited activity within patient's tolerance, Monitored during session    Home Living Family/patient expects to be discharged to:: Private residence Living Arrangements: Children                 Additional Comments: per chart lives with dtr, pt is unable to provide info; per nursing staff pt  requires assist with rolling, hygiene, feeding self etc.    Prior Function Prior Level of Function : Patient poor historian/Family not available             Mobility Comments: unsure of pt baseline, recent functional decline  per chart       Extremity/Trunk Assessment   Upper Extremity Assessment Upper Extremity Assessment: Generalized weakness;RUE deficits/detail RUE Deficits / Details: edema distal UE d/t IV infiltration earlier; pt grimaces with elbow and shoulder ROM RUE: Unable to fully assess due to pain    Lower Extremity Assessment Lower  Extremity Assessment: LLE deficits/detail RLE Deficits / Details: able to position R ankle to neutral passively; knee flexion ~ 30 degrees passively, testing limited by pain LLE Deficits / Details: AAROM grossly WFL, further testing limited by cognition       Communication   Communication Communication: Impaired Factors Affecting Communication: Difficulty expressing self    Cognition Arousal: Alert Behavior During Therapy: WFL for tasks assessed/performed   PT - Cognitive impairments: History of cognitive impairments, No family/caregiver present to determine baseline                       PT - Cognition Comments: per chart pt backt o baseline mentation         Cueing Cueing Techniques: Verbal cues, Gestural cues, Visual cues, Tactile cues     General Comments      Exercises     Assessment/Plan    PT Assessment Patient needs continued PT services  PT Problem List Decreased strength;Decreased balance;Decreased cognition;Decreased activity tolerance;Decreased mobility       PT Treatment Interventions Functional mobility training;Therapeutic activities;Patient/family education;Therapeutic exercise    PT Goals (Current goals can be found in the Care Plan section)  Acute Rehab PT Goals PT Goal Formulation: Patient unable to participate in goal setting Time For Goal Achievement: 05/13/23 Potential to Achieve Goals: Poor    Frequency Min 1X/week     Co-evaluation               AM-PAC PT "6 Clicks" Mobility  Outcome Measure Help needed turning from your back to your side while in a flat bed without using bedrails?: Total Help needed moving from lying on your back to sitting on the side of a flat bed without using bedrails?: Total Help needed moving to and from a bed to a chair (including a wheelchair)?: Total Help needed standing up from a chair using your arms (e.g., wheelchair or bedside chair)?: Total Help needed to walk in hospital room?:  Total Help needed climbing 3-5 steps with a railing? : Total 6 Click Score: 6    End of Session   Activity Tolerance: Other (comment) (limited by cognition) Patient left: in bed;with call bell/phone within reach;with bed alarm set Nurse Communication: Mobility status PT Visit Diagnosis: Other abnormalities of gait and mobility (R26.89)    Time: 1610-9604 PT Time Calculation (min) (ACUTE ONLY): 18 min   Charges:   PT Evaluation $PT Eval Low Complexity: 1 Low   PT General Charges $$ ACUTE PT VISIT: 1 Visit         Breon Diss, PT  Acute Rehab Dept [facility]) [phone]  04/29/2023   [facility] 04/29/2023, 2:52 PM

## 2023-04-29 NOTE — Progress Notes (Addendum)
 During shift change, this nurse noted that patient had significant swelling in right hand and arm. A little redness and warm to touch. Patient able to move arm and deny pain at this time. Will notify on-call [facility] provider and await response.

## 2023-04-29 NOTE — Progress Notes (Addendum)
 PROGRESS NOTE    Patricia Davies  ZOX:[id number] DOB: Mar 25, 1945 DOA: 04/24/2023 PCP: Patricia Grandchild, MD   Brief Narrative: [facility] pickup 04/25/2023 Admitted by PCCM on 04/24/2023 This is a 78 year old  female lives at home with her daughter for the last 3 years due to advanced dementia.  She has a past medical history significant for sarcoidosis COPD hypertension, kidney stones B12 deficiency and dementia.  She has been struggling with dementia for the past 7 to 8 years.  Patient had gradually progressive decline in the past 3 years.  Patient has very poor appetite does not eat or drink and has had significant weight loss.  On arrival to the emergency room she was obtunded hypotensive tachycardic  On admission her sodium was 149 BUN was 59 creatinine 1.72, albumin 2.1, lactic acid 6.5, hemoglobin 7.9, platelets 132 COVID RSV and flu were negative. ABG 7. 4/37/196 Chest x-ray -  Increased left apical opacity and volume loss compared with prior studies from 4 years ago. Findings could be secondary to progressive scarring, superimposed infection or developing mass. Correlate clinically. CT may be helpful for further evaluation.Otherwise grossly stable chronic lung disease attributed to underlying sarcoidosis. CT head showed cerebral atrophy and no evidence of acute intracranial abnormality  Assessment & Plan:   Principal Problem:   Shock (HCC) Active Problems:   Severe dementia without behavioral disturbance, psychotic disturbance, mood disturbance, or anxiety (HCC)   Hypovolemia   Failure to thrive in adult   #1 Acute metabolic encephalopathy likely secondary to multifactorial issues in the setting of severe dementia, pneumonia, shock, dehydration. Mental status improving and at baseline  #2 acute on chronic macrocytic anemia-hemoglobin 6.5 down from 7.9 Partly due to hemodilution from all the fluids that she has received so far. She was positive over 8 L Transfused 1 unit of packed RBC on  04/25/2023, hemoglobin 9.9 from 10 from 6.5 after transfusion. Folate level low B12 level normal Continue oral folate supplementation And continue B12  #3 AKI and hypernatremia resolved with IV fluids secondary to dehydration poor p.o. intake  #4 shock -resolved.  Likely multifactorial due to sepsis and dehydration on admission.  She presented obtunded initial blood pressure with a EMS was 50/60 responded to IV fluids.   #5 severe sepsis present on admission likely due to pneumonia in the setting of sarcoidosis COPD and severe dementia and possible aspiration. She was hypotensive, tachycardic tachypneic and hypoxic with lactic acidosis in the ER. Dc cefepime 2/21 Started unasyn 2/21 MRSA PCR was negative Chest xray 2/21-Continued left upper lobe opacity is noted concerning for pneumonia, scarring or possibly underlying malignancy. CT scan of the chest is recommended for further evaluation as noted on prior exam. Stable probable right lung scarring.  #6 lactic acidosis  improved with IV fluids and antibiotics Lactic acid 4.3 from 4.1 from 10.8 on admission  #7 failure to thrive in the setting of advanced dementia.  Discussed with daughter DNR DNI.  #8 hypokalemia/hypophosphatemia repleted  #9 multiple pressure injuries present on admission seen by wound care possible Kennedy ulcers Per wound care-Sacrum wound is 100% tightly adhered eschar; Unstageable pressure injury; 5X6cm, Left hip is dark red-purple Deep tissue pressure injury; 6X5cm Right hip is Stage 3 pressure injury; 50% red, 30% yellow, 20% dark red-purple Deep tissue pressure injury to wound edges, 3.8X1.9cm Recommending-Apply Medihoney to sacrum and right hip wounds Q day then cover with foam dressing.  Change foam dressing Q 3 days or PRN soiling Apply Xeroform gauze to left  hip wound Q day and cover with foam dressing.  Change foam dressing Q 3 days or PRN soiling.  #10 right upper extremity edema from IV infiltration  keep right upper extremity elevated as much as possible  Pressure Injury 04/24/23 Hip Left Deep Tissue Pressure Injury - Purple or maroon localized area of discolored intact skin or blood-filled blister due to damage of underlying soft tissue from pressure and/or shear. (Active)  04/24/23 1806  Location: Hip  Location Orientation: Left  Staging: Deep Tissue Pressure Injury - Purple or maroon localized area of discolored intact skin or blood-filled blister due to damage of underlying soft tissue from pressure and/or shear.  Wound Description (Comments):   Present on Admission: Yes  Dressing Type Gauze (Comment) 04/29/23 0800     Pressure Injury 04/24/23 Hip Right Stage 3 -  Full thickness tissue loss. Subcutaneous fat may be visible but bone, tendon or muscle are NOT exposed. (Active)  04/24/23 1813  Location: Hip  Location Orientation: Right  Staging: Stage 3 -  Full thickness tissue loss. Subcutaneous fat may be visible but bone, tendon or muscle are NOT exposed.  Wound Description (Comments):   Present on Admission: Yes  Dressing Type Gauze (Comment) 04/29/23 0800     Pressure Injury 04/24/23 Sacrum Unstageable - Full thickness tissue loss in which the base of the injury is covered by slough (yellow, tan, gray, green or brown) and/or eschar (tan, brown or black) in the wound bed. (Active)  04/24/23 1821  Location: Sacrum  Location Orientation:   Staging: Unstageable - Full thickness tissue loss in which the base of the injury is covered by slough (yellow, tan, gray, green or brown) and/or eschar (tan, brown or black) in the wound bed.  Wound Description (Comments):   Present on Admission: Yes  Dressing Type Foam - Lift dressing to assess site every shift 04/29/23 0800     Estimated body mass index is 17.14 kg/m as calculated from the following:   Height as of this encounter: 5' (1.524 m).   Weight as of this encounter: 39.8 kg.  DVT prophylaxis: scd Code Status: dnr Family  Communication:dw son Disposition Plan:  Status is: Inpatient Remains inpatient appropriate because: medically ready for dc Consultants:  pccm  Procedures: none Antimicrobials:cefepime  Subjective:  No overnight events reported P.o. intake has been minimal Objective: Vitals:   04/29/23 0600 04/29/23 0700 04/29/23 0800 04/29/23 0900  BP: (!) 112/53 119/69 (!) 120/59 117/67  Pulse: 73 82 85 73  Resp: 14 13 19  (!) 21  Temp: 97.7 F (36.5 C) 97.7 F (36.5 C) 97.7 F (36.5 C) 97.9 F (36.6 C)  TempSrc:   Bladder   SpO2: 91% 94% 96% 95%  Weight:      Height:        Intake/Output Summary (Last 24 hours) at 04/29/2023 1108 Last data filed at 04/29/2023 0915 Gross per 24 hour  Intake 1657.95 ml  Output 275 ml  Net 1382.95 ml   Filed Weights   04/26/23 0500 04/27/23 0500 04/29/23 0500  Weight: 38.5 kg 38.3 kg 39.8 kg    Examination:  General exam: Appears in no acute distress Respiratory system: Rhonchi left more than right  cardiovascular system: S1 & S2 heard, RRR. No JVD, murmurs, rubs, gallops or clicks. No pedal edema. Gastrointestinal system: Abdomen is nondistended, soft and nontender. No organomegaly or masses felt. Normal bowel sounds heard. Central nervous system: Does not follow commands or answer questions Extremities: Moves all extremities   Data  Reviewed: I have personally reviewed following labs and imaging studies  CBC: Recent Labs  Lab 04/24/23 1250 04/25/23 0321 04/25/23 1831 04/26/23 0320 04/27/23 0301 04/28/23 1033  WBC 7.5 8.0  --  11.6* 13.8* 13.6*  NEUTROABS 5.6  --   --   --   --   --   HGB 7.9* 6.5* 10.0* 9.9* 10.0* 9.8*  HCT 25.8* 20.7* 32.1* 31.8* 32.5* 31.4*  MCV 103.6* 100.5*  --  99.4 99.7 100.6*  PLT 132* 119*  --  135* 147* 174   Basic Metabolic Panel: Recent Labs  Lab 04/25/23 0321 04/26/23 0320 04/26/23 2048 04/27/23 0301 04/27/23 0437 04/27/23 0741 04/28/23 1033  NA 145 143 141 143  --   --  142  K 3.1* 4.1 3.8  3.3*  --   --  3.8  CL 106 108 108 110  --   --  109  CO2 26 24 26 25   --   --  25  GLUCOSE 100* 89 292* 233* 248* 203* 90  BUN 44* 33* 26* 22  --   --  14  CREATININE 0.82 0.53 0.46 0.36*  --   --  0.37*  CALCIUM 7.8* 8.3* 8.0* 8.2*  --   --  8.6*  MG 2.1  --   --   --   --  1.9  --   PHOS 2.0* 2.6  --   --   --   --   --    GFR: Estimated Creatinine Clearance: 38.2 mL/min (A) (by C-G formula based on SCr of 0.37 mg/dL (L)). Liver Function Tests: Recent Labs  Lab 04/24/23 1052 04/26/23 0320 04/28/23 1033  AST 57* 71* 39  ALT 21 53* 39  ALKPHOS 26* 49 40  BILITOT 1.6* 4.0* 1.5*  PROT 4.8* 5.4* 5.2*  ALBUMIN 2.1* 2.5* 2.3*   No results for input(s): "LIPASE", "AMYLASE" in the last 168 hours. Recent Labs  Lab 04/24/23 1600  AMMONIA 13   Coagulation Profile: Recent Labs  Lab 04/24/23 1250  INR 1.4*   Cardiac Enzymes: No results for input(s): "CKTOTAL", "CKMB", "CKMBINDEX", "TROPONINI" in the last 168 hours. BNP (last 3 results) No results for input(s): "PROBNP" in the last 8760 hours. HbA1C: No results for input(s): "HGBA1C" in the last 72 hours. CBG: Recent Labs  Lab 04/26/23 2333 04/27/23 0158 04/27/23 0200 04/27/23 0231 04/27/23 0334  GLUCAP 109* 54* 46* 135* 45*   Lipid Profile: No results for input(s): "CHOL", "HDL", "LDLCALC", "TRIG", "CHOLHDL", "LDLDIRECT" in the last 72 hours. Thyroid Function Tests: No results for input(s): "TSH", "T4TOTAL", "FREET4", "T3FREE", "THYROIDAB" in the last 72 hours.  Anemia Panel: No results for input(s): "VITAMINB12", "FOLATE", "FERRITIN", "TIBC", "IRON", "RETICCTPCT" in the last 72 hours.  Sepsis Labs: Recent Labs  Lab 04/24/23 1058 04/24/23 1524 04/24/23 1600 04/24/23 1827  LATICACIDVEN 6.5* 10.8* 4.1* 4.3*    Recent Results (from the past 240 hours)  Resp panel by RT-PCR (RSV, Flu A&B, Covid) Anterior Nasal Swab     Status: None   Collection Time: 04/24/23 10:41 AM   Specimen: Anterior Nasal Swab  Result  Value Ref Range Status   SARS Coronavirus 2 by RT PCR NEGATIVE NEGATIVE Final    Comment: (NOTE) SARS-CoV-2 target nucleic acids are NOT DETECTED.  The SARS-CoV-2 RNA is generally detectable in upper respiratory specimens during the acute phase of infection. The lowest concentration of SARS-CoV-2 viral copies this assay can detect is 138 copies/mL. A negative result does not preclude SARS-Cov-2 infection  and should not be used as the sole basis for treatment or other patient management decisions. A negative result may occur with  improper specimen collection/handling, submission of specimen other than nasopharyngeal swab, presence of viral mutation(s) within the areas targeted by this assay, and inadequate number of viral copies(<138 copies/mL). A negative result must be combined with clinical observations, patient history, and epidemiological information. The expected result is Negative.  Fact Sheet for Patients:  BloggerCourse.com  Fact Sheet for Healthcare Providers:  SeriousBroker.it  This test is no t yet approved or cleared by the [place] FDA and  has been authorized for detection and/or diagnosis of SARS-CoV-2 by FDA under an Emergency Use Authorization (EUA). This EUA will remain  in effect (meaning this test can be used) for the duration of the COVID-19 declaration under Section 564(b)(1) of the Act, 21 U.S.C.section 360bbb-3(b)(1), unless the authorization is terminated  or revoked sooner.       Influenza A by PCR NEGATIVE NEGATIVE Final   Influenza B by PCR NEGATIVE NEGATIVE Final    Comment: (NOTE) The Xpert Xpress SARS-CoV-2/FLU/RSV plus assay is intended as an aid in the diagnosis of influenza from Nasopharyngeal swab specimens and should not be used as a sole basis for treatment. Nasal washings and aspirates are unacceptable for Xpert Xpress SARS-CoV-2/FLU/RSV testing.  Fact Sheet for  Patients: BloggerCourse.com  Fact Sheet for Healthcare Providers: SeriousBroker.it  This test is not yet approved or cleared by the [place] FDA and has been authorized for detection and/or diagnosis of SARS-CoV-2 by FDA under an Emergency Use Authorization (EUA). This EUA will remain in effect (meaning this test can be used) for the duration of the COVID-19 declaration under Section 564(b)(1) of the Act, 21 U.S.C. section 360bbb-3(b)(1), unless the authorization is terminated or revoked.     Resp Syncytial Virus by PCR NEGATIVE NEGATIVE Final    Comment: (NOTE) Fact Sheet for Patients: BloggerCourse.com  Fact Sheet for Healthcare Providers: SeriousBroker.it  This test is not yet approved or cleared by the [place] FDA and has been authorized for detection and/or diagnosis of SARS-CoV-2 by FDA under an Emergency Use Authorization (EUA). This EUA will remain in effect (meaning this test can be used) for the duration of the COVID-19 declaration under Section 564(b)(1) of the Act, 21 U.S.C. section 360bbb-3(b)(1), unless the authorization is terminated or revoked.  Performed at [facility], [address] [place], [place] [address]   Blood Culture (routine x 2)     Status: None   Collection Time: 04/24/23 10:50 AM   Specimen: BLOOD  Result Value Ref Range Status   Specimen Description   Final    BLOOD RIGHT ANTECUBITAL Performed at [facility], [address], [place], [place] [address]    Special Requests   Final    BOTTLES DRAWN AEROBIC ONLY Blood Culture results may not be optimal due to an inadequate volume of blood received in culture bottles Performed at [facility], [address] [place], [place] [address]    Culture   Final    NO GROWTH 5 DAYS Performed at [facility], [address], [place], [place] [address]    Report Status 04/29/2023 FINAL  Final  Blood Culture (routine x 2)     Status: None   Collection Time: 04/24/23  4:00 PM   Specimen: BLOOD RIGHT ARM  Result Value Ref Range Status   Specimen Description   Final  BLOOD RIGHT ARM Performed at [facility], [address] [place], [place] [address]    Special Requests   Final    BOTTLES DRAWN AEROBIC ONLY Blood Culture results may not be optimal due to an inadequate volume of blood received in culture bottles Performed at [facility], [address] [place], [place] [address]    Culture   Final    NO GROWTH 5 DAYS Performed at [facility], [address], [place], [place] [address]    Report Status 04/29/2023 FINAL  Final  MRSA Next Gen by PCR, Nasal     Status: None   Collection Time: 04/25/23  4:32 PM   Specimen: Nasal Mucosa; Nasal Swab  Result Value Ref Range Status   MRSA by PCR Next Gen NOT DETECTED NOT DETECTED Final    Comment: (NOTE) The GeneXpert MRSA Assay (FDA approved for NASAL specimens only), is one component of a comprehensive MRSA colonization surveillance program. It is not intended to diagnose MRSA infection nor to guide or monitor treatment for MRSA infections. Test performance is not FDA approved in patients less than 63 years old. Performed at [facility], [address], [place], [place] [address]      Radiology Studies: No results found.   Scheduled Meds:  Chlorhexidine Gluconate Cloth  6 each Topical QHS   feeding supplement  237 mL Oral BID BM   folic acid  1 mg Oral Daily   leptospermum manuka honey  1 Application Topical QHS   midodrine  5 mg Oral TID WC   mouth rinse  15 mL Mouth Rinse 4 times per day   Continuous Infusions:  sodium chloride 75 mL/hr at 04/29/23 0600   ampicillin-sulbactam (UNASYN) IV Stopped (04/29/23 0550)     LOS: 5 days   Patricia Ren, MD  04/29/2023, 11:08 AM

## 2023-04-30 DIAGNOSIS — Z7189 Other specified counseling: Secondary | ICD-10-CM | POA: Diagnosis not present

## 2023-04-30 DIAGNOSIS — R579 Shock, unspecified: Secondary | ICD-10-CM | POA: Diagnosis not present

## 2023-04-30 DIAGNOSIS — R627 Adult failure to thrive: Secondary | ICD-10-CM | POA: Diagnosis not present

## 2023-04-30 DIAGNOSIS — Z515 Encounter for palliative care: Secondary | ICD-10-CM

## 2023-04-30 DIAGNOSIS — R4589 Other symptoms and signs involving emotional state: Secondary | ICD-10-CM

## 2023-04-30 DIAGNOSIS — F03C0 Unspecified dementia, severe, without behavioral disturbance, psychotic disturbance, mood disturbance, and anxiety: Secondary | ICD-10-CM

## 2023-04-30 LAB — COMPREHENSIVE METABOLIC PANEL
ALT: 29 U/L (ref 0–44)
AST: 25 U/L (ref 15–41)
Albumin: 2.1 g/dL — ABNORMAL LOW (ref 3.5–5.0)
Alkaline Phosphatase: 43 U/L (ref 38–126)
Anion gap: 6 (ref 5–15)
BUN: 16 mg/dL (ref 8–23)
CO2: 25 mmol/L (ref 22–32)
Calcium: 7.6 mg/dL — ABNORMAL LOW (ref 8.9–10.3)
Chloride: 109 mmol/L (ref 98–111)
Creatinine, Ser: <0.3 mg/dL — ABNORMAL LOW (ref 0.44–1.00)
Glucose, Bld: 100 mg/dL — ABNORMAL HIGH (ref 70–99)
Potassium: 3.4 mmol/L — ABNORMAL LOW (ref 3.5–5.1)
Sodium: 140 mmol/L (ref 135–145)
Total Bilirubin: 0.7 mg/dL (ref 0.0–1.2)
Total Protein: 4.9 g/dL — ABNORMAL LOW (ref 6.5–8.1)

## 2023-04-30 LAB — CBC
HCT: 27.5 % — ABNORMAL LOW (ref 36.0–46.0)
Hemoglobin: 8.8 g/dL — ABNORMAL LOW (ref 12.0–15.0)
MCH: 31.2 pg (ref 26.0–34.0)
MCHC: 32 g/dL (ref 30.0–36.0)
MCV: 97.5 fL (ref 80.0–100.0)
Platelets: 257 10*3/uL (ref 150–400)
RBC: 2.82 MIL/uL — ABNORMAL LOW (ref 3.87–5.11)
RDW: 17.9 % — ABNORMAL HIGH (ref 11.5–15.5)
WBC: 7.5 10*3/uL (ref 4.0–10.5)
nRBC: 0 % (ref 0.0–0.2)

## 2023-04-30 MED ORDER — POTASSIUM CHLORIDE CRYS ER 20 MEQ PO TBCR
40.0000 meq | EXTENDED_RELEASE_TABLET | Freq: Once | ORAL | Status: AC
  Administered 2023-04-30: 40 meq via ORAL
  Filled 2023-04-30: qty 2

## 2023-04-30 NOTE — Plan of Care (Signed)
  Problem: Clinical Measurements: Goal: Diagnostic test results will improve Outcome: Progressing Goal: Respiratory complications will improve Outcome: Progressing   Problem: Coping: Goal: Level of anxiety will decrease Outcome: Progressing

## 2023-04-30 NOTE — Progress Notes (Signed)
 Daily Progress Note   Patient Name: Patricia Davies       Date: 04/30/2023 DOB: 15-Sep-1945  Age: 78 y.o. MRN#: [id number] Attending Physician: Briant Cedar, MD Primary Care Physician: [name], MD Admit Date: 04/24/2023 Length of Stay: 6 days  Reason for Consultation/Follow-up: Establishing goals of care  Subjective:   CC: Patient denies any complaints at this time.  Following up regarding complex medical decision making  Subjective:  Reviewed EMR prior to presenting to bedside.  Discussed care with bedside RN for updates.  When presenting to bedside, patient's son Tomasa Hosteller) present at bedside with his wife.  Able to introduce myself as a member of the palliative medicine team.  Inquired about discussions had with providers.  Son notes that his sister has been primarily involved in discussions though son states that his sister had a stroke a few years ago and so does not have the best memory.  Inquired if patient had ever completed ACP documentation talking about her wishes for medical care and he denies that she did.  Discussed that if patient is not married and does not have completed HCPOA documentation, decision making would fall to both patient's son and daughter. Son discussed currently the plan is for patient to go to rehab.  Son notes that he is unsure if patient will be able to go home after that.  They were inquiring about full-time nursing care for patient.  Spent time discussing that insurance does not cover nurse being present in the home at all times.  Discussed based on pathways for medical care moving forward, patient could go to rehab if that is appropriate from PT/OT perspective and then go home with home health.  Described in general what home health would provide which would not be 24/7 care by nursing provider.  Discussed that the most comprehensive care 1 can receive at home is through hospice care.  Spent time explaining that hospice care was focusing on 1  comfort at the end of life when prognosis is 6 months or less.  Son immediately stated that patient is not ready for hospice and that he wants to do everything to make her "better". With permission, able to discuss that at overall trajectory in patients with dementia.  Discussed that dementia is a chronic disease that will continue to deteriorate.  Discussed complications that can arise from this such as dysphagia and muscle weakening leading to things such as pneumonia which is what brought patient into the hospital.  Discussed there is no magical "fix" for this and instead attempt intervention such as adjusting patient's diet which have been done with SLP interventions.  Sending notes this though also acknowledged difficulties in discussing these items when it comes to his mother.  He stated that they have seen this deterioration and other family members though he is hopeful that with medical interventions and God, patient will continue to have a lot more time.  Acknowledged this while also expressing importance of engaging in conversations moving forward to assist in supporting quality of life.  Noted will consult outpatient palliative medicine to continue discussions.  Spent time answering questions as able and providing emotional support reactive listening.  Noted palliative medicine team will continue to follow along with patient's medical journey.  Updated IDT including hospitalist, TOC, and RN regarding discussion.  Also discussed importance of involving both of patient's children in conversations if patient does not have elected HCPOA documentation.  Objective:   Vital Signs:  BP 115/72  Pulse 69   Temp 97.9 F (36.6 C)   Resp 13   Ht 5' (1.524 m)   Wt 43.7 kg   SpO2 97%   BMI 18.82 kg/m   Physical Exam: General: NAD, awake at times though quickly falling asleep, interactive though confused, chronically ill-appearing, cachectic, frail HENT: Temporal wasting present  bilaterally Cardiovascular: RRR Respiratory: no increased work of breathing noted, not in respiratory distress Extremities: Muscle wasting present in all extremities Neuro: Confused at times though pleasant  Imaging:  I personally reviewed recent imaging.   Assessment & Plan:   Assessment: Patient is a 78 year old female with a past medical history of dementia, sarcoidosis, hypertension, kidney stones, and B12 deficiency who was living at home with her daughter and admitted on 04/24/2023 for management of poor appetite and weight loss.  As noted by daughter, patient has been progressively declining for the past 3 years.  Upon admission, patient received management for acute on chronic anemia, AKI, electrolyte abnormalities, and sepsis in the second of pneumonia.  Palliative medicine team consulted to assist with complex medical decision making.  Recommendations/Plan: # Complex medical decision making/goals of care:  - Patient unable to engage in complex medical decision making due to medical status.  -Discussed care with patient's son at bedside as detailed above in HPI.  Son noted that he and his sister have discussed desiring patient to go to rehab to regain strength.  Reviewed possible pathways for medical care moving forward.  Discussed progressive decline noted with diagnosis of dementia.  Noted importance of continuing discussions to optimize quality of life moving forward with noted chronic progressive medical illness.  Son wanting patient to get "better".  Son noted patient is "not ready" for hospice.  -Noted importance to staff of involving both of children and decision making for patient if patient does not have completed HCPOA documentation previously noted.  -  Code Status: Limited: Do not attempt resuscitation (DNR) -DNR-LIMITED -Do Not Intubate/DNI   # Psychosocial Support:  -Son, daughter   -No ACP documentation on file.  Son noted patient had not completed HCPOA  documentation.  # Discharge Planning: To Be Determined -TOC assisting with discharge plan  Discussed with: Patient's son, patient's daughter-in-law, RN, hospitalist, [facility]  Thank you for allowing the palliative care team to participate in the care Elmon Else.  Alvester Morin, DO Palliative Care Provider PMT # [phone]  If patient remains symptomatic despite maximum doses, please call PMT at [phone] between 0700 and 1900. Outside of these hours, please call attending, as PMT does not have night coverage.  Personally spent 55 minutes in patient care including extensive chart review (labs, imaging, progress/consult notes, vital signs), medically appropraite exam, discussed with treatment team, education to patient, family, and staff, documenting clinical information, medication review and management, coordination of care, and available advanced directive documents.

## 2023-04-30 NOTE — Progress Notes (Addendum)
 PROGRESS NOTE    Patricia Davies  ZOX:[id number] DOB: 1945-04-03 DOA: 04/24/2023 PCP: [name], MD   Brief Narrative:  TRH pickup 04/25/2023 Admitted by PCCM on 04/24/2023 78 year old  female lives at home with her daughter for the last 3 years due to advanced dementia.  She has a past medical history significant for sarcoidosis COPD hypertension, kidney stones B12 deficiency and dementia.  She has been struggling with dementia for the past 7 to 8 years.  Patient had gradually progressive decline in the past 3 years. Patient has very poor appetite with significant weight loss. On arrival to the emergency room she was obtunded hypotensive. Labs on admission showed sodium was 149 BUN was 59 creatinine 1.72, albumin 2.1, lactic acid 6.5, hemoglobin 7.9, platelets 132. COVID RSV and flu were negative. Chest x-ray -  Increased left apical opacity. CT head showed cerebral atrophy and no evidence of acute intracranial abnormality.  Patient admitted for further management.    Met tech feeding patient, appears stable/comfortable, denies any new complaints    Assessment & Plan:   Principal Problem:   Shock (HCC) Active Problems:   Severe dementia without behavioral disturbance, psychotic disturbance, mood disturbance, or anxiety (HCC)   Hypovolemia   Failure to thrive in adult   Need for emotional support   Counseling and coordination of care   Goals of care, counseling/discussion   Palliative care encounter   Acute metabolic encephalopathy Likely secondary to multifactorial issues in the setting of severe dementia, pneumonia, shock, dehydration Mental status improving and at baseline  Severe sepsis likely 2/2 PNA Resolved She presented obtunded initial blood pressure with a EMS was 50/60 responded to IV fluids  She was hypotensive, tachycardic, tachypneic and hypoxic with lactic acidosis in the ER. Continue unasyn, started on 2/21 MRSA PCR was negative Chest xray 2/21-Continued left  upper lobe opacity is noted concerning for pneumonia, scarring or possibly underlying malignancy  Acute on chronic macrocytic anemia Hemoglobin 6.5 down from 7.9 Partly due to hemodilution from all the fluids that she has received so far, was positive over 8 L Transfused 1 unit of packed RBC on 04/25/2023 Folate level low Continue oral folate and B12 supplementation  AKI and hypernatremia  Resolved with IV fluids secondary to dehydration poor p.o. intake  Hypokalemia Replace as needed  Failure to thrive in the setting of advanced dementia Supportive care Hospice and palliative consulted, appreciate recs  Right upper extremity edema  Likely from IV infiltration  Doppler to r/o dvt keep right upper extremity elevated as much as possible  Multiple pressure injuries present on admission  Per wound care-Sacrum wound is 100% tightly adhered eschar; Unstageable pressure injury; 5X6cm, Left hip is dark red-purple Deep tissue pressure injury; 6X5cm Right hip is Stage 3 pressure injury; 50% red, 30% yellow, 20% dark red-purple Deep tissue pressure injury to wound edges, 3.8X1.9cm Recommending-Apply Medihoney to sacrum and right hip wounds Q day then cover with foam dressing.  Change foam dressing Q 3 days or PRN soiling Apply Xeroform gauze to left hip wound Q day and cover with foam dressing.  Change foam dressing Q 3 days or PRN soiling.   Pressure Injury 04/24/23 Hip Left Deep Tissue Pressure Injury - Purple or maroon localized area of discolored intact skin or blood-filled blister due to damage of underlying soft tissue from pressure and/or shear. (Active)  04/24/23 1806  Location: Hip  Location Orientation: Left  Staging: Deep Tissue Pressure Injury - Purple or maroon localized area of  discolored intact skin or blood-filled blister due to damage of underlying soft tissue from pressure and/or shear.  Wound Description (Comments):   Present on Admission: Yes  Dressing Type Gauze  (Comment) 04/29/23 0800     Pressure Injury 04/24/23 Hip Right Stage 3 -  Full thickness tissue loss. Subcutaneous fat may be visible but bone, tendon or muscle are NOT exposed. (Active)  04/24/23 1813  Location: Hip  Location Orientation: Right  Staging: Stage 3 -  Full thickness tissue loss. Subcutaneous fat may be visible but bone, tendon or muscle are NOT exposed.  Wound Description (Comments):   Present on Admission: Yes  Dressing Type Gauze (Comment) 04/29/23 0800     Pressure Injury 04/24/23 Sacrum Unstageable - Full thickness tissue loss in which the base of the injury is covered by slough (yellow, tan, gray, green or brown) and/or eschar (tan, brown or black) in the wound bed. (Active)  04/24/23 1821  Location: Sacrum  Location Orientation:   Staging: Unstageable - Full thickness tissue loss in which the base of the injury is covered by slough (yellow, tan, gray, green or brown) and/or eschar (tan, brown or black) in the wound bed.  Wound Description (Comments):   Present on Admission: Yes  Dressing Type Foam - Lift dressing to assess site every shift 04/29/23 1930     Estimated body mass index is 18.82 kg/m as calculated from the following:   Height as of this encounter: 5' (1.524 m).   Weight as of this encounter: 43.7 kg.  DVT prophylaxis: scd Code Status: dnr Family Communication:dw son Disposition Plan:  Status is: Inpatient Remains inpatient appropriate because: medically ready for dc Consultants:  pccm  Procedures: none Antimicrobials:Unasyn   Objective: Vitals:   04/30/23 1400 04/30/23 1500 04/30/23 1600 04/30/23 1700  BP: (!) 106/51 115/62 (!) 118/58 127/61  Pulse: 66 64 66 61  Resp: (!) 27 20 (!) 23 (!) 22  Temp: 98.8 F (37.1 C) 99 F (37.2 C) 99 F (37.2 C) 99 F (37.2 C)  TempSrc:   Bladder   SpO2: 94% 100% 100% 99%  Weight:      Height:        Intake/Output Summary (Last 24 hours) at 04/30/2023 1917 Last data filed at 04/30/2023  1824 Gross per 24 hour  Intake 1967.33 ml  Output 150 ml  Net 1817.33 ml   Filed Weights   04/27/23 0500 04/29/23 0500 04/30/23 0500  Weight: 38.3 kg 39.8 kg 43.7 kg    Examination: General: NAD, confused Cardiovascular: S1, S2 present Respiratory: CTAB Abdomen: Soft, nontender, nondistended, bowel sounds present Musculoskeletal: No bilateral pedal edema noted, RUE swelling Skin: Normal Psychiatry: Unable to assess    Data Reviewed: I have personally reviewed following labs and imaging studies  CBC: Recent Labs  Lab 04/24/23 1250 04/25/23 0321 04/25/23 1831 04/26/23 0320 04/27/23 0301 04/28/23 1033 04/30/23 0308  WBC 7.5 8.0  --  11.6* 13.8* 13.6* 7.5  NEUTROABS 5.6  --   --   --   --   --   --   HGB 7.9* 6.5* 10.0* 9.9* 10.0* 9.8* 8.8*  HCT 25.8* 20.7* 32.1* 31.8* 32.5* 31.4* 27.5*  MCV 103.6* 100.5*  --  99.4 99.7 100.6* 97.5  PLT 132* 119*  --  135* 147* 174 257   Basic Metabolic Panel: Recent Labs  Lab 04/25/23 0321 04/26/23 0320 04/26/23 2048 04/27/23 0301 04/27/23 0437 04/27/23 0741 04/28/23 1033 04/30/23 0308  NA 145 143 141 143  --   --  142 140  K 3.1* 4.1 3.8 3.3*  --   --  3.8 3.4*  CL 106 108 108 110  --   --  109 109  CO2 26 24 26 25   --   --  25 25  GLUCOSE 100* 89 292* 233* 248* 203* 90 100*  BUN 44* 33* 26* 22  --   --  14 16  CREATININE 0.82 0.53 0.46 0.36*  --   --  0.37* <0.30*  CALCIUM 7.8* 8.3* 8.0* 8.2*  --   --  8.6* 7.6*  MG 2.1  --   --   --   --  1.9  --   --   PHOS 2.0* 2.6  --   --   --   --   --   --    GFR: CrCl cannot be calculated (This lab value cannot be used to calculate CrCl because it is not a number: <0.30). Liver Function Tests: Recent Labs  Lab 04/24/23 1052 04/26/23 0320 04/28/23 1033 04/30/23 0308  AST 57* 71* 39 25  ALT 21 53* 39 29  ALKPHOS 26* 49 40 43  BILITOT 1.6* 4.0* 1.5* 0.7  PROT 4.8* 5.4* 5.2* 4.9*  ALBUMIN 2.1* 2.5* 2.3* 2.1*   No results for input(s): "LIPASE", "AMYLASE" in the last  168 hours. Recent Labs  Lab 04/24/23 1600  AMMONIA 13   Coagulation Profile: Recent Labs  Lab 04/24/23 1250  INR 1.4*   Cardiac Enzymes: No results for input(s): "CKTOTAL", "CKMB", "CKMBINDEX", "TROPONINI" in the last 168 hours. BNP (last 3 results) No results for input(s): "PROBNP" in the last 8760 hours. HbA1C: No results for input(s): "HGBA1C" in the last 72 hours. CBG: Recent Labs  Lab 04/26/23 2333 04/27/23 0158 04/27/23 0200 04/27/23 0231 04/27/23 0334  GLUCAP 109* 54* 46* 135* 45*   Lipid Profile: No results for input(s): "CHOL", "HDL", "LDLCALC", "TRIG", "CHOLHDL", "LDLDIRECT" in the last 72 hours. Thyroid Function Tests: No results for input(s): "TSH", "T4TOTAL", "FREET4", "T3FREE", "THYROIDAB" in the last 72 hours.  Anemia Panel: No results for input(s): "VITAMINB12", "FOLATE", "FERRITIN", "TIBC", "IRON", "RETICCTPCT" in the last 72 hours.  Sepsis Labs: Recent Labs  Lab 04/24/23 1058 04/24/23 1524 04/24/23 1600 04/24/23 1827  LATICACIDVEN 6.5* 10.8* 4.1* 4.3*    Recent Results (from the past 240 hours)  Resp panel by RT-PCR (RSV, Flu A&B, Covid) Anterior Nasal Swab     Status: None   Collection Time: 04/24/23 10:41 AM   Specimen: Anterior Nasal Swab  Result Value Ref Range Status   SARS Coronavirus 2 by RT PCR NEGATIVE NEGATIVE Final    Comment: (NOTE) SARS-CoV-2 target nucleic acids are NOT DETECTED.  The SARS-CoV-2 RNA is generally detectable in upper respiratory specimens during the acute phase of infection. The lowest concentration of SARS-CoV-2 viral copies this assay can detect is 138 copies/mL. A negative result does not preclude SARS-Cov-2 infection and should not be used as the sole basis for treatment or other patient management decisions. A negative result may occur with  improper specimen collection/handling, submission of specimen other than nasopharyngeal swab, presence of viral mutation(s) within the areas targeted by this assay,  and inadequate number of viral copies(<138 copies/mL). A negative result must be combined with clinical observations, patient history, and epidemiological information. The expected result is Negative.  Fact Sheet for Patients:  BloggerCourse.com  Fact Sheet for Healthcare Providers:  SeriousBroker.it  This test is no t yet approved or cleared by the [place] and  has been authorized for detection and/or diagnosis of SARS-CoV-2 by FDA under an Emergency Use Authorization (EUA). This EUA will remain  in effect (meaning this test can be used) for the duration of the COVID-19 declaration under Section 564(b)(1) of the Act, 21 U.S.C.section 360bbb-3(b)(1), unless the authorization is terminated  or revoked sooner.       Influenza A by PCR NEGATIVE NEGATIVE Final   Influenza B by PCR NEGATIVE NEGATIVE Final    Comment: (NOTE) The Xpert Xpress SARS-CoV-2/FLU/RSV plus assay is intended as an aid in the diagnosis of influenza from Nasopharyngeal swab specimens and should not be used as a sole basis for treatment. Nasal washings and aspirates are unacceptable for Xpert Xpress SARS-CoV-2/FLU/RSV testing.  Fact Sheet for Patients: BloggerCourse.com  Fact Sheet for Healthcare Providers: SeriousBroker.it  This test is not yet approved or cleared by the [place] FDA and has been authorized for detection and/or diagnosis of SARS-CoV-2 by FDA under an Emergency Use Authorization (EUA). This EUA will remain in effect (meaning this test can be used) for the duration of the COVID-19 declaration under Section 564(b)(1) of the Act, 21 U.S.C. section 360bbb-3(b)(1), unless the authorization is terminated or revoked.     Resp Syncytial Virus by PCR NEGATIVE NEGATIVE Final    Comment: (NOTE) Fact Sheet for Patients: BloggerCourse.com  Fact Sheet for  Healthcare Providers: SeriousBroker.it  This test is not yet approved or cleared by the [place] FDA and has been authorized for detection and/or diagnosis of SARS-CoV-2 by FDA under an Emergency Use Authorization (EUA). This EUA will remain in effect (meaning this test can be used) for the duration of the COVID-19 declaration under Section 564(b)(1) of the Act, 21 U.S.C. section 360bbb-3(b)(1), unless the authorization is terminated or revoked.  Performed at [facility], [address] [place], [place] [address]   Blood Culture (routine x 2)     Status: None   Collection Time: 04/24/23 10:50 AM   Specimen: BLOOD  Result Value Ref Range Status   Specimen Description   Final    BLOOD RIGHT ANTECUBITAL Performed at [facility], [address], [place], [place] [address]    Special Requests   Final    BOTTLES DRAWN AEROBIC ONLY Blood Culture results may not be optimal due to an inadequate volume of blood received in culture bottles Performed at [facility], [address] [place], [place] [address]    Culture   Final    NO GROWTH 5 DAYS Performed at [facility], [address], [place], [place] [address]    Report Status 04/29/2023 FINAL  Final  Blood Culture (routine x 2)     Status: None   Collection Time: 04/24/23  4:00 PM   Specimen: BLOOD RIGHT ARM  Result Value Ref Range Status   Specimen Description   Final    BLOOD RIGHT ARM Performed at [facility], [address] [place], [place] [address]    Special Requests   Final    BOTTLES DRAWN AEROBIC ONLY Blood Culture results may not be optimal due to an inadequate volume of blood received in culture bottles Performed at [facility], [address], [place], [place] [address]    Culture   Final    NO GROWTH 5 DAYS Performed at [facility], [address], [place], [place]  [address]    Report Status 04/29/2023 FINAL  Final  MRSA Next Gen  by PCR, Nasal     Status: None   Collection Time: 04/25/23  4:32 PM   Specimen: Nasal Mucosa; Nasal Swab  Result Value Ref Range Status   MRSA by PCR Next Gen NOT DETECTED NOT DETECTED Final    Comment: (NOTE) The GeneXpert MRSA Assay (FDA approved for NASAL specimens only), is one component of a comprehensive MRSA colonization surveillance program. It is not intended to diagnose MRSA infection nor to guide or monitor treatment for MRSA infections. Test performance is not FDA approved in patients less than 81 years old. Performed at [facility], [address], [place], [place] [address]      Radiology Studies: DG Chest 1 View Result Date: 04/29/2023 CLINICAL DATA:  Hypoxia. EXAM: CHEST  1 VIEW COMPARISON:  Chest x-ray dated April 25, 2023. FINDINGS: Stable cardiomediastinal silhouette with normal heart size. Chronic upper lobe predominant fibrotic interstitial lung disease again noted, with similar superimposed increased density in the left upper lobe. Suspected new small left pleural effusion with mildly increased left basilar opacity. No pneumothorax. No acute osseous abnormality. IMPRESSION: 1. Suspected new small left pleural effusion with mildly increased left basilar opacity, atelectasis versus pneumonia. 2. Chronic upper lobe predominant fibrotic interstitial lung disease with similar superimposed increased density in the left upper lobe, which could reflect superimposed pneumonia, progressive scarring, or new mass lesion. Electronically Signed   By: Obie Dredge M.D.   On: 04/29/2023 12:32     Scheduled Meds:  Chlorhexidine Gluconate Cloth  6 each Topical QHS   feeding supplement  237 mL Oral BID BM   folic acid  1 mg Oral Daily   heparin injection (subcutaneous)  5,000 Units Subcutaneous Q8H   leptospermum manuka honey  1 Application Topical QHS   memantine  10 mg Oral BID   midodrine  5  mg Oral TID WC   mouth rinse  15 mL Mouth Rinse 4 times per day   Continuous Infusions:  sodium chloride 75 mL/hr at 04/30/23 1506   ampicillin-sulbactam (UNASYN) IV Stopped (04/30/23 1505)     LOS: 6 days   Briant Cedar, MD  04/30/2023, 7:17 PM

## 2023-05-01 ENCOUNTER — Inpatient Hospital Stay (HOSPITAL_COMMUNITY): Payer: Medicare PPO

## 2023-05-01 ENCOUNTER — Other Ambulatory Visit (HOSPITAL_COMMUNITY): Payer: Self-pay

## 2023-05-01 DIAGNOSIS — M7989 Other specified soft tissue disorders: Secondary | ICD-10-CM | POA: Diagnosis not present

## 2023-05-01 DIAGNOSIS — R579 Shock, unspecified: Secondary | ICD-10-CM | POA: Diagnosis not present

## 2023-05-01 LAB — CBC
HCT: 28.1 % — ABNORMAL LOW (ref 36.0–46.0)
Hemoglobin: 8.4 g/dL — ABNORMAL LOW (ref 12.0–15.0)
MCH: 31.1 pg (ref 26.0–34.0)
MCHC: 29.9 g/dL — ABNORMAL LOW (ref 30.0–36.0)
MCV: 104.1 fL — ABNORMAL HIGH (ref 80.0–100.0)
Platelets: 290 10*3/uL (ref 150–400)
RBC: 2.7 MIL/uL — ABNORMAL LOW (ref 3.87–5.11)
RDW: 18.3 % — ABNORMAL HIGH (ref 11.5–15.5)
WBC: 6.8 10*3/uL (ref 4.0–10.5)
nRBC: 0 % (ref 0.0–0.2)

## 2023-05-01 LAB — BASIC METABOLIC PANEL
Anion gap: 5 (ref 5–15)
BUN: 14 mg/dL (ref 8–23)
CO2: 24 mmol/L (ref 22–32)
Calcium: 7.5 mg/dL — ABNORMAL LOW (ref 8.9–10.3)
Chloride: 109 mmol/L (ref 98–111)
Creatinine, Ser: <0.3 mg/dL — ABNORMAL LOW (ref 0.44–1.00)
Glucose, Bld: 86 mg/dL (ref 70–99)
Potassium: 3.9 mmol/L (ref 3.5–5.1)
Sodium: 138 mmol/L (ref 135–145)

## 2023-05-01 MED ORDER — APIXABAN 5 MG PO TABS: 10.0000 mg | ORAL_TABLET | Freq: Two times a day (BID) | ORAL | Status: DC

## 2023-05-01 MED ORDER — ORAL CARE MOUTH RINSE
15.0000 mL | OROMUCOSAL | Status: DC
  Administered 2023-05-01 – 2023-05-11 (×37): 15 mL via OROMUCOSAL

## 2023-05-01 MED ORDER — APIXABAN 5 MG PO TABS: 5.0000 mg | ORAL_TABLET | Freq: Two times a day (BID) | ORAL | Status: DC

## 2023-05-01 MED ORDER — ORAL CARE MOUTH RINSE: 15.0000 mL | OROMUCOSAL | Status: DC | PRN

## 2023-05-01 MED ORDER — HEPARIN SODIUM (PORCINE) 5000 UNIT/ML IJ SOLN
5000.0000 [IU] | Freq: Three times a day (TID) | INTRAMUSCULAR | Status: DC
  Administered 2023-05-01 – 2023-05-04 (×9): 5000 [IU] via SUBCUTANEOUS
  Filled 2023-05-01 (×9): qty 1

## 2023-05-01 NOTE — Plan of Care (Signed)
   Problem: Clinical Measurements: Goal: Respiratory complications will improve Outcome: Progressing Goal: Cardiovascular complication will be avoided Outcome: Progressing

## 2023-05-01 NOTE — Progress Notes (Signed)
 PROGRESS NOTE    Patricia Davies  ZOX:[id number] DOB: 11/04/1945 DOA: 04/24/2023 PCP: [name], MD   Brief Narrative:  TRH pickup 04/25/2023 Admitted by PCCM on 04/24/2023 78 year old  female lives at home with her daughter for the last 3 years due to advanced dementia.  She has a past medical history significant for sarcoidosis COPD hypertension, kidney stones B12 deficiency and dementia.  She has been struggling with dementia for the past 7 to 8 years.  Patient had gradually progressive decline in the past 3 years. Patient has very poor appetite with significant weight loss. On arrival to the emergency room she was obtunded hypotensive. Labs on admission showed sodium was 149 BUN was 59 creatinine 1.72, albumin 2.1, lactic acid 6.5, hemoglobin 7.9, platelets 132. COVID RSV and flu were negative. Chest x-ray -  Increased left apical opacity. CT head showed cerebral atrophy and no evidence of acute intracranial abnormality.  Patient admitted for further management.    Today, patient denies any new complaints.  More awake today, able to tell me where she is, birthday, who the president is.    Assessment & Plan:   Principal Problem:   Shock (HCC) Active Problems:   Severe dementia without behavioral disturbance, psychotic disturbance, mood disturbance, or anxiety (HCC)   Hypovolemia   Failure to thrive in adult   Need for emotional support   Counseling and coordination of care   Goals of care, counseling/discussion   Palliative care encounter   Acute metabolic encephalopathy Likely secondary to multifactorial issues in the setting of severe dementia, pneumonia, shock, dehydration Mental status improving and at baseline  Severe sepsis likely 2/2 PNA Resolved She presented obtunded initial blood pressure with a EMS was 50/60 responded to IV fluids  She was hypotensive, tachycardic, tachypneic and hypoxic with lactic acidosis in the ER. Continue unasyn, started on 2/21 for 7  days MRSA PCR was negative Chest xray 2/21-Continued left upper lobe opacity is noted concerning for pneumonia, scarring or possibly underlying malignancy  Acute on chronic macrocytic anemia Hemoglobin dropped to 6.5 on 04/25/23 Partly due to hemodilution from all the fluids that she has received so far, was positive over 8 L Transfused 1 unit of packed RBC on 04/25/2023 Folate level low Continue oral folate and B12 supplementation  AKI and hypernatremia  Resolved with IV fluids secondary to dehydration poor p.o. intake  Hypokalemia Replace as needed  Failure to thrive in the setting of advanced dementia Supportive care Hospice and palliative consulted, appreciate recs  Right upper extremity SVT Likely from IV infiltration  Doppler showed SVT Keep right upper extremity elevated as much as possible, pain management prn  Multiple pressure injuries present on admission  Per wound care-Sacrum wound is 100% tightly adhered eschar; Unstageable pressure injury; 5X6cm, Left hip is dark red-purple Deep tissue pressure injury; 6X5cm Right hip is Stage 3 pressure injury; 50% red, 30% yellow, 20% dark red-purple Deep tissue pressure injury to wound edges, 3.8X1.9cm Recommending-Apply Medihoney to sacrum and right hip wounds Q day then cover with foam dressing.  Change foam dressing Q 3 days or PRN soiling Apply Xeroform gauze to left hip wound Q day and cover with foam dressing.  Change foam dressing Q 3 days or PRN soiling.   Pressure Injury 04/24/23 Hip Left Deep Tissue Pressure Injury - Purple or maroon localized area of discolored intact skin or blood-filled blister due to damage of underlying soft tissue from pressure and/or shear. (Active)  04/24/23 1806  Location: Hip  Location Orientation: Left  Staging: Deep Tissue Pressure Injury - Purple or maroon localized area of discolored intact skin or blood-filled blister due to damage of underlying soft tissue from pressure and/or shear.   Wound Description (Comments):   Present on Admission: Yes  Dressing Type Foam - Lift dressing to assess site every shift 05/01/23 0800     Pressure Injury 04/24/23 Hip Right Stage 3 -  Full thickness tissue loss. Subcutaneous fat may be visible but bone, tendon or muscle are NOT exposed. (Active)  04/24/23 1813  Location: Hip  Location Orientation: Right  Staging: Stage 3 -  Full thickness tissue loss. Subcutaneous fat may be visible but bone, tendon or muscle are NOT exposed.  Wound Description (Comments):   Present on Admission: Yes  Dressing Type Foam - Lift dressing to assess site every shift 05/01/23 0800     Pressure Injury 04/24/23 Sacrum Unstageable - Full thickness tissue loss in which the base of the injury is covered by slough (yellow, tan, gray, green or brown) and/or eschar (tan, brown or black) in the wound bed. (Active)  04/24/23 1821  Location: Sacrum  Location Orientation:   Staging: Unstageable - Full thickness tissue loss in which the base of the injury is covered by slough (yellow, tan, gray, green or brown) and/or eschar (tan, brown or black) in the wound bed.  Wound Description (Comments):   Present on Admission: Yes  Dressing Type Foam - Lift dressing to assess site every shift 05/01/23 0800     Estimated body mass index is 19.68 kg/m as calculated from the following:   Height as of this encounter: 5' (1.524 m).   Weight as of this encounter: 45.7 kg.  DVT prophylaxis: scd Code Status: dnr Family Communication:dw son Disposition Plan:  Status is: Inpatient Remains inpatient appropriate because: medically ready for dc Consultants:  pccm  Procedures: none Antimicrobials:Unasyn   Objective: Vitals:   05/01/23 1000 05/01/23 1100 05/01/23 1200 05/01/23 1600  BP:   (!) 124/50   Pulse: 63 69 68   Resp: 20 (!) 21 (!) 24   Temp: (!) 97.3 F (36.3 C) 97.7 F (36.5 C) 97.9 F (36.6 C) 97.9 F (36.6 C)  TempSrc:    Oral  SpO2: 97% 100% 98%   Weight:       Height:        Intake/Output Summary (Last 24 hours) at 05/01/2023 1712 Last data filed at 05/01/2023 1311 Gross per 24 hour  Intake 1687.18 ml  Output 575 ml  Net 1112.18 ml   Filed Weights   04/29/23 0500 04/30/23 0500 05/01/23 0500  Weight: 39.8 kg 43.7 kg 45.7 kg    Examination: General: NAD Cardiovascular: S1, S2 present Respiratory: CTAB Abdomen: Soft, nontender, nondistended, bowel sounds present Musculoskeletal: No bilateral pedal edema noted, RUE swelling Skin: Normal Psychiatry: Appears to be normal mood    Data Reviewed: I have personally reviewed following labs and imaging studies  CBC: Recent Labs  Lab 04/26/23 0320 04/27/23 0301 04/28/23 1033 04/30/23 0308 05/01/23 0308  WBC 11.6* 13.8* 13.6* 7.5 6.8  HGB 9.9* 10.0* 9.8* 8.8* 8.4*  HCT 31.8* 32.5* 31.4* 27.5* 28.1*  MCV 99.4 99.7 100.6* 97.5 104.1*  PLT 135* 147* 174 257 290   Basic Metabolic Panel: Recent Labs  Lab 04/25/23 0321 04/26/23 0320 04/26/23 2048 04/27/23 0301 04/27/23 0437 04/27/23 0741 04/28/23 1033 04/30/23 0308 05/01/23 0308  NA 145 143 141 143  --   --  142 140 138  K 3.1*  4.1 3.8 3.3*  --   --  3.8 3.4* 3.9  CL 106 108 108 110  --   --  109 109 109  CO2 26 24 26 25   --   --  25 25 24   GLUCOSE 100* 89 292* 233* 248* 203* 90 100* 86  BUN 44* 33* 26* 22  --   --  14 16 14   CREATININE 0.82 0.53 0.46 0.36*  --   --  0.37* <0.30* <0.30*  CALCIUM 7.8* 8.3* 8.0* 8.2*  --   --  8.6* 7.6* 7.5*  MG 2.1  --   --   --   --  1.9  --   --   --   PHOS 2.0* 2.6  --   --   --   --   --   --   --    GFR: CrCl cannot be calculated (This lab value cannot be used to calculate CrCl because it is not a number: <0.30). Liver Function Tests: Recent Labs  Lab 04/26/23 0320 04/28/23 1033 04/30/23 0308  AST 71* 39 25  ALT 53* 39 29  ALKPHOS 49 40 43  BILITOT 4.0* 1.5* 0.7  PROT 5.4* 5.2* 4.9*  ALBUMIN 2.5* 2.3* 2.1*   No results for input(s): "LIPASE", "AMYLASE" in the last 168  hours. No results for input(s): "AMMONIA" in the last 168 hours.  Coagulation Profile: No results for input(s): "INR", "PROTIME" in the last 168 hours.  Cardiac Enzymes: No results for input(s): "CKTOTAL", "CKMB", "CKMBINDEX", "TROPONINI" in the last 168 hours. BNP (last 3 results) No results for input(s): "PROBNP" in the last 8760 hours. HbA1C: No results for input(s): "HGBA1C" in the last 72 hours. CBG: Recent Labs  Lab 04/26/23 2333 04/27/23 0158 04/27/23 0200 04/27/23 0231 04/27/23 0334  GLUCAP 109* 54* 46* 135* 45*   Lipid Profile: No results for input(s): "CHOL", "HDL", "LDLCALC", "TRIG", "CHOLHDL", "LDLDIRECT" in the last 72 hours. Thyroid Function Tests: No results for input(s): "TSH", "T4TOTAL", "FREET4", "T3FREE", "THYROIDAB" in the last 72 hours.  Anemia Panel: No results for input(s): "VITAMINB12", "FOLATE", "FERRITIN", "TIBC", "IRON", "RETICCTPCT" in the last 72 hours.  Sepsis Labs: Recent Labs  Lab 04/24/23 1827  LATICACIDVEN 4.3*    Recent Results (from the past 240 hours)  Resp panel by RT-PCR (RSV, Flu A&B, Covid) Anterior Nasal Swab     Status: None   Collection Time: 04/24/23 10:41 AM   Specimen: Anterior Nasal Swab  Result Value Ref Range Status   SARS Coronavirus 2 by RT PCR NEGATIVE NEGATIVE Final    Comment: (NOTE) SARS-CoV-2 target nucleic acids are NOT DETECTED.  The SARS-CoV-2 RNA is generally detectable in upper respiratory specimens during the acute phase of infection. The lowest concentration of SARS-CoV-2 viral copies this assay can detect is 138 copies/mL. A negative result does not preclude SARS-Cov-2 infection and should not be used as the sole basis for treatment or other patient management decisions. A negative result may occur with  improper specimen collection/handling, submission of specimen other than nasopharyngeal swab, presence of viral mutation(s) within the areas targeted by this assay, and inadequate number of  viral copies(<138 copies/mL). A negative result must be combined with clinical observations, patient history, and epidemiological information. The expected result is Negative.  Fact Sheet for Patients:  BloggerCourse.com  Fact Sheet for Healthcare Providers:  SeriousBroker.it  This test is no t yet approved or cleared by the [place] FDA and  has been authorized for detection and/or diagnosis  of SARS-CoV-2 by FDA under an Emergency Use Authorization (EUA). This EUA will remain  in effect (meaning this test can be used) for the duration of the COVID-19 declaration under Section 564(b)(1) of the Act, 21 U.S.C.section 360bbb-3(b)(1), unless the authorization is terminated  or revoked sooner.       Influenza A by PCR NEGATIVE NEGATIVE Final   Influenza B by PCR NEGATIVE NEGATIVE Final    Comment: (NOTE) The Xpert Xpress SARS-CoV-2/FLU/RSV plus assay is intended as an aid in the diagnosis of influenza from Nasopharyngeal swab specimens and should not be used as a sole basis for treatment. Nasal washings and aspirates are unacceptable for Xpert Xpress SARS-CoV-2/FLU/RSV testing.  Fact Sheet for Patients: BloggerCourse.com  Fact Sheet for Healthcare Providers: SeriousBroker.it  This test is not yet approved or cleared by the [place] FDA and has been authorized for detection and/or diagnosis of SARS-CoV-2 by FDA under an Emergency Use Authorization (EUA). This EUA will remain in effect (meaning this test can be used) for the duration of the COVID-19 declaration under Section 564(b)(1) of the Act, 21 U.S.C. section 360bbb-3(b)(1), unless the authorization is terminated or revoked.     Resp Syncytial Virus by PCR NEGATIVE NEGATIVE Final    Comment: (NOTE) Fact Sheet for Patients: BloggerCourse.com  Fact Sheet for Healthcare  Providers: SeriousBroker.it  This test is not yet approved or cleared by the [place] FDA and has been authorized for detection and/or diagnosis of SARS-CoV-2 by FDA under an Emergency Use Authorization (EUA). This EUA will remain in effect (meaning this test can be used) for the duration of the COVID-19 declaration under Section 564(b)(1) of the Act, 21 U.S.C. section 360bbb-3(b)(1), unless the authorization is terminated or revoked.  Performed at [facility], [address], [place], [place] [address]   Blood Culture (routine x 2)     Status: None   Collection Time: 04/24/23 10:50 AM   Specimen: BLOOD  Result Value Ref Range Status   Specimen Description   Final    BLOOD RIGHT ANTECUBITAL Performed at [facility], [address], [place], [place] [address]    Special Requests   Final    BOTTLES DRAWN AEROBIC ONLY Blood Culture results may not be optimal due to an inadequate volume of blood received in culture bottles Performed at [facility], [address], [place], [place] [address]    Culture   Final    NO GROWTH 5 DAYS Performed at [facility], [address] [place], [place] [address]    Report Status 04/29/2023 FINAL  Final  Blood Culture (routine x 2)     Status: None   Collection Time: 04/24/23  4:00 PM   Specimen: BLOOD RIGHT ARM  Result Value Ref Range Status   Specimen Description   Final    BLOOD RIGHT ARM Performed at [facility], [address], [place], [place] [address]    Special Requests   Final    BOTTLES DRAWN AEROBIC ONLY Blood Culture results may not be optimal due to an inadequate volume of blood received in culture bottles Performed at [facility], [address], [place], [place] [address]    Culture   Final    NO GROWTH 5 DAYS Performed at [facility], [address], [place], [place] [address]     Report Status 04/29/2023 FINAL  Final  MRSA Next Gen by PCR, Nasal  Status: None   Collection Time: 04/25/23  4:32 PM   Specimen: Nasal Mucosa; Nasal Swab  Result Value Ref Range Status   MRSA by PCR Next Gen NOT DETECTED NOT DETECTED Final    Comment: (NOTE) The GeneXpert MRSA Assay (FDA approved for NASAL specimens only), is one component of a comprehensive MRSA colonization surveillance program. It is not intended to diagnose MRSA infection nor to guide or monitor treatment for MRSA infections. Test performance is not FDA approved in patients less than 67 years old. Performed at [facility], [address] [place], [place] [address]      Radiology Studies: VAS [place] UPPER EXTREMITY VENOUS DUPLEX Result Date: 05/01/2023 UPPER VENOUS STUDY  Patient Name:  Patricia Davies  Date of Exam:   05/01/2023 Medical Rec #: [id number]     Accession #:    [id number] Date of Birth: 1945/07/25     Patient Gender: F Patient Age:   17 years Exam Location:  [facility] Procedure:      VAS [place] UPPER EXTREMITY VENOUS DUPLEX Referring Phys: [facility] Mattisyn Cardona --------------------------------------------------------------------------------  Indications: Swelling Risk Factors: Past pregnancy. Anticoagulation: Eliquis. Limitations: Limited range of motion & small vessels. Comparison Study: None. Performing Technologist: Shona Simpson  Examination Guidelines: A complete evaluation includes B-mode imaging, spectral Doppler, color Doppler, and power Doppler as needed of all accessible portions of each vessel. Bilateral testing is considered an integral part of a complete examination. Limited examinations for reoccurring indications may be performed as noted.  Right Findings: +----------+------------+---------+-----------+------------------+-------+ RIGHT     CompressiblePhasicitySpontaneous    Properties    Summary +----------+------------+---------+-----------+------------------+-------+  IJV           Full       Yes       Yes                              +----------+------------+---------+-----------+------------------+-------+ Subclavian    Full       Yes       Yes                              +----------+------------+---------+-----------+------------------+-------+ Axillary      Full       Yes       Yes                              +----------+------------+---------+-----------+------------------+-------+ Brachial      Full       Yes       Yes                              +----------+------------+---------+-----------+------------------+-------+ Radial        Full       Yes       Yes                              +----------+------------+---------+-----------+------------------+-------+ Ulnar         Full       Yes       Yes                              +----------+------------+---------+-----------+------------------+-------+ Cephalic  None                 No     brightly echogenic Acute  +----------+------------+---------+-----------+------------------+-------+ Basilic       Full                 Yes                              +----------+------------+---------+-----------+------------------+-------+ Occlusive thrombosis seen thoughout the length of the Cephalic Vein.  Left Findings: +----+------------+---------+-----------+----------+-------+ LEFTCompressiblePhasicitySpontaneousPropertiesSummary +----+------------+---------+-----------+----------+-------+ IJV     Full       Yes       Yes                      +----+------------+---------+-----------+----------+-------+  Summary:  Right: No evidence of deep vein thrombosis in the upper extremity. Findings consistent with acute superficial vein thrombosis involving the right cephalic vein.  Left: No evidence of thrombosis in the subclavian.  *See table(s) above for measurements and observations.     Preliminary      Scheduled Meds:  Chlorhexidine Gluconate Cloth  6  each Topical QHS   feeding supplement  237 mL Oral BID BM   folic acid  1 mg Oral Daily   heparin injection (subcutaneous)  5,000 Units Subcutaneous Q8H   leptospermum manuka honey  1 Application Topical QHS   memantine  10 mg Oral BID   midodrine  5 mg Oral TID WC   mouth rinse  15 mL Mouth Rinse 4 times per day   Continuous Infusions:  ampicillin-sulbactam (UNASYN) IV Stopped (05/01/23 1514)     LOS: 7 days   Briant Cedar, MD  05/01/2023, 5:12 PM

## 2023-05-01 NOTE — Progress Notes (Signed)
 Upper extremity venous duplex completed. Please see CV Procedures for preliminary results.  Initial findings reported to [place], Charity fundraiser.  Shona Simpson, RVT 05/01/23 2:26 PM

## 2023-05-01 NOTE — TOC Progression Note (Addendum)
 Transition of Care [facility]) - Progression Note    Patient Details  Name: Patricia Davies MRN: [id number] Date of Birth: 06/03/1945  [facility] CM/SW Contact  Adrian Prows, RN Phone Number: 05/01/2023, 2:21 PM  Clinical Narrative:    Orders received for hospital bed, HHPT,  and OP pall care; spoke w/ pt's dtr [name] [phone]) @ 1051; she agrees to recc service and DME; she does not have an agency preference for either; [name] confirmed d/c address: [address] [place], [place] [address]; she was also was notified list of agency left in patient's room; notified pt's son would like to discuss d/c plan at 1127; LVM for [name] [phone]) at 1427; awaiting return call. LVM for Amy at [facility] to place referral @ 1428; awaiting return call.  -1446- referral placed w/ Jermaine at [facility]; he says agency can provide DME; agency contact info placed in follow up provider section of d/c instructions.  -1458- notified by Amy at [place] agency can provide service; agency contact info placed in follow up provider section of d/c instructions.  -1530- spoke w/ pt's dtr [name] at bedside; she was given names of providing agencies for HHPT and hospital bed; she was also given list of pall care agencies; awaiting choice.  -1532- received call back from pt's son; he says he was updated by pt's nurse; he has no additional questions. Expected Discharge Plan: Home/Self Care Barriers to Discharge: Continued Medical Work up  Expected Discharge Plan and Services   Discharge Planning Services: CM Consult   Living arrangements for the past 2 months: Single Family Home                                       Social Determinants of Health (SDOH) Interventions SDOH Screenings   Food Insecurity: No Food Insecurity (04/26/2023)  Housing: Low Risk  (04/26/2023)  Transportation Needs: No Transportation Needs (04/26/2023)  Utilities: Not At Risk (04/26/2023)   Alcohol Screen: Low Risk  (12/06/2021)  Depression (PHQ2-9): Low Risk  (12/06/2021)  Financial Resource Strain: Low Risk  (12/06/2021)  Physical Activity: Inactive (12/06/2021)  Social Connections: Unknown (12/06/2021)  Stress: No Stress Concern Present (12/06/2021)  Tobacco Use: Medium Risk (04/24/2023)    Readmission Risk Interventions     No data to display

## 2023-05-01 NOTE — Plan of Care (Signed)
  Problem: Clinical Measurements: Goal: Ability to maintain clinical measurements within normal limits will improve Outcome: Progressing Goal: Respiratory complications will improve Outcome: Progressing   Problem: Health Behavior/Discharge Planning: Goal: Ability to manage health-related needs will improve Outcome: Not Progressing   Problem: Activity: Goal: Risk for activity intolerance will decrease Outcome: Not Progressing   Problem: Nutrition: Goal: Adequate nutrition will be maintained Outcome: Not Progressing

## 2023-05-01 NOTE — TOC Benefit Eligibility Note (Signed)
 Pharmacy Patient Advocate Encounter  Insurance verification completed.    The patient is insured through [employer]. Patient has Medicare and is not eligible for a copay card, but may be able to apply for patient assistance or Medicare RX Payment Plan (Patient Must reach out to their plan, if eligible for payment plan), if available.    Ran test claim for Eliquis 5mg  and the current 30 day co-pay is $12.15.   This test claim was processed through [facility]- copay amounts may vary at other pharmacies due to pharmacy/plan contracts, or as the patient moves through the different stages of their insurance plan.

## 2023-05-02 ENCOUNTER — Inpatient Hospital Stay (HOSPITAL_COMMUNITY): Payer: Medicare PPO

## 2023-05-02 DIAGNOSIS — R579 Shock, unspecified: Secondary | ICD-10-CM | POA: Diagnosis not present

## 2023-05-02 LAB — CBC
HCT: 32.5 % — ABNORMAL LOW (ref 36.0–46.0)
Hemoglobin: 10 g/dL — ABNORMAL LOW (ref 12.0–15.0)
MCH: 31.3 pg (ref 26.0–34.0)
MCHC: 30.8 g/dL (ref 30.0–36.0)
MCV: 101.6 fL — ABNORMAL HIGH (ref 80.0–100.0)
Platelets: 306 10*3/uL (ref 150–400)
RBC: 3.2 MIL/uL — ABNORMAL LOW (ref 3.87–5.11)
RDW: 17.8 % — ABNORMAL HIGH (ref 11.5–15.5)
WBC: 6.2 10*3/uL (ref 4.0–10.5)
nRBC: 0 % (ref 0.0–0.2)

## 2023-05-02 LAB — COMPREHENSIVE METABOLIC PANEL
ALT: 23 U/L (ref 0–44)
AST: 24 U/L (ref 15–41)
Albumin: 2 g/dL — ABNORMAL LOW (ref 3.5–5.0)
Alkaline Phosphatase: 41 U/L (ref 38–126)
Anion gap: 5 (ref 5–15)
BUN: 11 mg/dL (ref 8–23)
CO2: 27 mmol/L (ref 22–32)
Calcium: 8.1 mg/dL — ABNORMAL LOW (ref 8.9–10.3)
Chloride: 109 mmol/L (ref 98–111)
Creatinine, Ser: 0.33 mg/dL — ABNORMAL LOW (ref 0.44–1.00)
GFR, Estimated: >60 mL/min (ref >60)
Glucose, Bld: 92 mg/dL (ref 70–99)
Potassium: 3.8 mmol/L (ref 3.5–5.1)
Sodium: 141 mmol/L (ref 135–145)
Total Bilirubin: 1 mg/dL (ref 0.0–1.2)
Total Protein: 5 g/dL — ABNORMAL LOW (ref 6.5–8.1)

## 2023-05-02 MED ORDER — FUROSEMIDE 10 MG/ML IJ SOLN
20.0000 mg | Freq: Once | INTRAMUSCULAR | Status: AC
  Administered 2023-05-02: 20 mg via INTRAVENOUS
  Filled 2023-05-02: qty 2

## 2023-05-02 NOTE — Progress Notes (Signed)
 PROGRESS NOTE    Patricia Davies  WUJ:[id number] DOB: 1945-06-07 DOA: 04/24/2023 PCP: [name], MD   Brief Narrative:  TRH pickup 04/25/2023 Admitted by PCCM on 04/24/2023 78 year old  female lives at home with her daughter for the last 3 years due to advanced dementia.  She has a past medical history significant for sarcoidosis COPD hypertension, kidney stones B12 deficiency and dementia.  She has been struggling with dementia for the past 7 to 8 years.  Patient had gradually progressive decline in the past 3 years. Patient has very poor appetite with significant weight loss. On arrival to the emergency room she was obtunded hypotensive. Labs on admission showed sodium was 149 BUN was 59 creatinine 1.72, albumin 2.1, lactic acid 6.5, hemoglobin 7.9, platelets 132. COVID RSV and flu were negative. Chest x-ray -  Increased left apical opacity. CT head showed cerebral atrophy and no evidence of acute intracranial abnormality.  Patient admitted for further management.    Today, pt appears stable, comfortable. Denies any new complaints. Appears to be having questionable desaturation. Noted bilateral pedal edema. Will give 1 dose of IV lasix 20 mg. BLE doppler pending    Assessment & Plan:   Principal Problem:   Shock (HCC) Active Problems:   Severe dementia without behavioral disturbance, psychotic disturbance, mood disturbance, or anxiety (HCC)   Hypovolemia   Failure to thrive in adult   Need for emotional support   Counseling and coordination of care   Goals of care, counseling/discussion   Palliative care encounter   Acute metabolic encephalopathy Likely secondary to multifactorial issues in the setting of severe dementia, pneumonia, shock, dehydration Mental status improving and at baseline  Severe sepsis likely 2/2 PNA Resolved She presented obtunded initial blood pressure with a EMS was 50/60 responded to IV fluids  She was hypotensive, tachycardic, tachypneic and hypoxic  with lactic acidosis in the ER. Completed 7 days of Unasyn on 05/01/23 MRSA PCR was negative Repeat Chest xray 2/28 -Continued left upper lobe opacity is noted concerning for pneumonia, scarring or possibly underlying malignancy  Acute on chronic macrocytic anemia Hemoglobin dropped to 6.5 on 04/25/23 Partly due to hemodilution from all the fluids that she has received so far, was positive over 8 L Transfused 1 unit of packed RBC on 04/25/2023 Folate level low Continue oral folate and B12 supplementation  AKI and hypernatremia  Resolved with IV fluids secondary to dehydration poor p.o. intake  Hypokalemia Replace as needed  Failure to thrive in the setting of advanced dementia Supportive care Hospice and palliative consulted, appreciate recs  Right upper extremity SVT Likely from IV infiltration  Doppler showed SVT Keep right upper extremity elevated as much as possible, pain management prn  Multiple pressure injuries present on admission  Per wound care-Sacrum wound is 100% tightly adhered eschar; Unstageable pressure injury; 5X6cm, Left hip is dark red-purple Deep tissue pressure injury; 6X5cm Right hip is Stage 3 pressure injury; 50% red, 30% yellow, 20% dark red-purple Deep tissue pressure injury to wound edges, 3.8X1.9cm Recommending-Apply Medihoney to sacrum and right hip wounds Q day then cover with foam dressing.  Change foam dressing Q 3 days or PRN soiling Apply Xeroform gauze to left hip wound Q day and cover with foam dressing.  Change foam dressing Q 3 days or PRN soiling.   Pressure Injury 04/24/23 Hip Left Deep Tissue Pressure Injury - Purple or maroon localized area of discolored intact skin or blood-filled blister due to damage of underlying soft tissue from  pressure and/or shear. (Active)  04/24/23 1806  Location: Hip  Location Orientation: Left  Staging: Deep Tissue Pressure Injury - Purple or maroon localized area of discolored intact skin or blood-filled  blister due to damage of underlying soft tissue from pressure and/or shear.  Wound Description (Comments):   Present on Admission: Yes  Dressing Type Foam - Lift dressing to assess site every shift 05/02/23 1541     Pressure Injury 04/24/23 Hip Right Stage 3 -  Full thickness tissue loss. Subcutaneous fat may be visible but bone, tendon or muscle are NOT exposed. (Active)  04/24/23 1813  Location: Hip  Location Orientation: Right  Staging: Stage 3 -  Full thickness tissue loss. Subcutaneous fat may be visible but bone, tendon or muscle are NOT exposed.  Wound Description (Comments):   Present on Admission: Yes  Dressing Type Foam - Lift dressing to assess site every shift 05/02/23 1541     Pressure Injury 04/24/23 Sacrum Unstageable - Full thickness tissue loss in which the base of the injury is covered by slough (yellow, tan, gray, green or brown) and/or eschar (tan, brown or black) in the wound bed. (Active)  04/24/23 1821  Location: Sacrum  Location Orientation:   Staging: Unstageable - Full thickness tissue loss in which the base of the injury is covered by slough (yellow, tan, gray, green or brown) and/or eschar (tan, brown or black) in the wound bed.  Wound Description (Comments):   Present on Admission: Yes  Dressing Type Foam - Lift dressing to assess site every shift 05/02/23 1541     Estimated body mass index is 18.38 kg/m as calculated from the following:   Height as of this encounter: 5' (1.524 m).   Weight as of this encounter: 42.7 kg.  DVT prophylaxis: scd Code Status: dnr Family Communication:dw son Disposition Plan:  Status is: Inpatient Remains inpatient appropriate because: medically ready for dc Consultants:  pccm  Procedures: none Antimicrobials:Completed Unasyn   Objective: Vitals:   05/02/23 1300 05/02/23 1400 05/02/23 1500 05/02/23 1541  BP:    123/72  Pulse: 89 83 82 87  Resp: 18 17 18 20   Temp:    98.4 F (36.9 C)  TempSrc:    Oral  SpO2:  100% 98% 91% 96%  Weight:      Height:        Intake/Output Summary (Last 24 hours) at 05/02/2023 1734 Last data filed at 05/02/2023 4098 Gross per 24 hour  Intake 219.31 ml  Output 700 ml  Net -480.69 ml   Filed Weights   04/30/23 0500 05/01/23 0500 05/02/23 0105  Weight: 43.7 kg 45.7 kg 42.7 kg    Examination: General: NAD Cardiovascular: S1, S2 present Respiratory: CTAB Abdomen: Soft, nontender, nondistended, bowel sounds present Musculoskeletal: bilateral pedal edema noted Skin: Normal Psychiatry: Appears to be normal mood    Data Reviewed: I have personally reviewed following labs and imaging studies  CBC: Recent Labs  Lab 04/27/23 0301 04/28/23 1033 04/30/23 0308 05/01/23 0308 05/02/23 0257  WBC 13.8* 13.6* 7.5 6.8 6.2  HGB 10.0* 9.8* 8.8* 8.4* 10.0*  HCT 32.5* 31.4* 27.5* 28.1* 32.5*  MCV 99.7 100.6* 97.5 104.1* 101.6*  PLT 147* 174 257 290 306   Basic Metabolic Panel: Recent Labs  Lab 04/26/23 0320 04/26/23 2048 04/27/23 0301 04/27/23 0437 04/27/23 0741 04/28/23 1033 04/30/23 0308 05/01/23 0308 05/02/23 0257  NA 143   < > 143  --   --  142 140 138 141  K 4.1   < >  3.3*  --   --  3.8 3.4* 3.9 3.8  CL 108   < > 110  --   --  109 109 109 109  CO2 24   < > 25  --   --  25 25 24 27   GLUCOSE 89   < > 233*   < > 203* 90 100* 86 92  BUN 33*   < > 22  --   --  14 16 14 11   CREATININE 0.53   < > 0.36*  --   --  0.37* <0.30* <0.30* 0.33*  CALCIUM 8.3*   < > 8.2*  --   --  8.6* 7.6* 7.5* 8.1*  MG  --   --   --   --  1.9  --   --   --   --   PHOS 2.6  --   --   --   --   --   --   --   --    < > = values in this interval not displayed.   GFR: Estimated Creatinine Clearance: 41 mL/min (A) (by C-G formula based on SCr of 0.33 mg/dL (L)). Liver Function Tests: Recent Labs  Lab 04/26/23 0320 04/28/23 1033 04/30/23 0308 05/02/23 0257  AST 71* 39 25 24  ALT 53* 39 29 23  ALKPHOS 49 40 43 41  BILITOT 4.0* 1.5* 0.7 1.0  PROT 5.4* 5.2* 4.9* 5.0*   ALBUMIN 2.5* 2.3* 2.1* 2.0*   No results for input(s): "LIPASE", "AMYLASE" in the last 168 hours. No results for input(s): "AMMONIA" in the last 168 hours.  Coagulation Profile: No results for input(s): "INR", "PROTIME" in the last 168 hours.  Cardiac Enzymes: No results for input(s): "CKTOTAL", "CKMB", "CKMBINDEX", "TROPONINI" in the last 168 hours. BNP (last 3 results) No results for input(s): "PROBNP" in the last 8760 hours. HbA1C: No results for input(s): "HGBA1C" in the last 72 hours. CBG: Recent Labs  Lab 04/26/23 2333 04/27/23 0158 04/27/23 0200 04/27/23 0231 04/27/23 0334  GLUCAP 109* 54* 46* 135* 45*   Lipid Profile: No results for input(s): "CHOL", "HDL", "LDLCALC", "TRIG", "CHOLHDL", "LDLDIRECT" in the last 72 hours. Thyroid Function Tests: No results for input(s): "TSH", "T4TOTAL", "FREET4", "T3FREE", "THYROIDAB" in the last 72 hours.  Anemia Panel: No results for input(s): "VITAMINB12", "FOLATE", "FERRITIN", "TIBC", "IRON", "RETICCTPCT" in the last 72 hours.  Sepsis Labs: No results for input(s): "PROCALCITON", "LATICACIDVEN" in the last 168 hours.   Recent Results (from the past 240 hours)  Resp panel by RT-PCR (RSV, Flu A&B, Covid) Anterior Nasal Swab     Status: None   Collection Time: 04/24/23 10:41 AM   Specimen: Anterior Nasal Swab  Result Value Ref Range Status   SARS Coronavirus 2 by RT PCR NEGATIVE NEGATIVE Final    Comment: (NOTE) SARS-CoV-2 target nucleic acids are NOT DETECTED.  The SARS-CoV-2 RNA is generally detectable in upper respiratory specimens during the acute phase of infection. The lowest concentration of SARS-CoV-2 viral copies this assay can detect is 138 copies/mL. A negative result does not preclude SARS-Cov-2 infection and should not be used as the sole basis for treatment or other patient management decisions. A negative result may occur with  improper specimen collection/handling, submission of specimen other than  nasopharyngeal swab, presence of viral mutation(s) within the areas targeted by this assay, and inadequate number of viral copies(<138 copies/mL). A negative result must be combined with clinical observations, patient history, and epidemiological information. The expected result is  Negative.  Fact Sheet for Patients:  BloggerCourse.com  Fact Sheet for Healthcare Providers:  SeriousBroker.it  This test is no t yet approved or cleared by the [place] FDA and  has been authorized for detection and/or diagnosis of SARS-CoV-2 by FDA under an Emergency Use Authorization (EUA). This EUA will remain  in effect (meaning this test can be used) for the duration of the COVID-19 declaration under Section 564(b)(1) of the Act, 21 U.S.C.section 360bbb-3(b)(1), unless the authorization is terminated  or revoked sooner.       Influenza A by PCR NEGATIVE NEGATIVE Final   Influenza B by PCR NEGATIVE NEGATIVE Final    Comment: (NOTE) The Xpert Xpress SARS-CoV-2/FLU/RSV plus assay is intended as an aid in the diagnosis of influenza from Nasopharyngeal swab specimens and should not be used as a sole basis for treatment. Nasal washings and aspirates are unacceptable for Xpert Xpress SARS-CoV-2/FLU/RSV testing.  Fact Sheet for Patients: BloggerCourse.com  Fact Sheet for Healthcare Providers: SeriousBroker.it  This test is not yet approved or cleared by the [place] FDA and has been authorized for detection and/or diagnosis of SARS-CoV-2 by FDA under an Emergency Use Authorization (EUA). This EUA will remain in effect (meaning this test can be used) for the duration of the COVID-19 declaration under Section 564(b)(1) of the Act, 21 U.S.C. section 360bbb-3(b)(1), unless the authorization is terminated or revoked.     Resp Syncytial Virus by PCR NEGATIVE NEGATIVE Final    Comment:  (NOTE) Fact Sheet for Patients: BloggerCourse.com  Fact Sheet for Healthcare Providers: SeriousBroker.it  This test is not yet approved or cleared by the [place] FDA and has been authorized for detection and/or diagnosis of SARS-CoV-2 by FDA under an Emergency Use Authorization (EUA). This EUA will remain in effect (meaning this test can be used) for the duration of the COVID-19 declaration under Section 564(b)(1) of the Act, 21 U.S.C. section 360bbb-3(b)(1), unless the authorization is terminated or revoked.  Performed at [facility], [address] [place], [place] [address]   Blood Culture (routine x 2)     Status: None   Collection Time: 04/24/23 10:50 AM   Specimen: BLOOD  Result Value Ref Range Status   Specimen Description   Final    BLOOD RIGHT ANTECUBITAL Performed at [facility], [address], [place], [place] [address]    Special Requests   Final    BOTTLES DRAWN AEROBIC ONLY Blood Culture results may not be optimal due to an inadequate volume of blood received in culture bottles Performed at [facility], [address], [place], [place] [address]    Culture   Final    NO GROWTH 5 DAYS Performed at [facility], [address] [place], [place] [address]    Report Status 04/29/2023 FINAL  Final  Blood Culture (routine x 2)     Status: None   Collection Time: 04/24/23  4:00 PM   Specimen: BLOOD RIGHT ARM  Result Value Ref Range Status   Specimen Description   Final    BLOOD RIGHT ARM Performed at [facility], [address] [place], [place] [address]    Special Requests   Final    BOTTLES DRAWN AEROBIC ONLY Blood Culture results may not be optimal due to an inadequate volume of blood received in culture bottles Performed at [facility], [address] [place], [place] [address]    Culture   Final  NO GROWTH 5 DAYS Performed at [facility], [address] [place], [place] [address]    Report Status 04/29/2023 FINAL  Final  MRSA Next Gen by PCR, Nasal     Status: None   Collection Time: 04/25/23  4:32 PM   Specimen: Nasal Mucosa; Nasal Swab  Result Value Ref Range Status   MRSA by PCR Next Gen NOT DETECTED NOT DETECTED Final    Comment: (NOTE) The GeneXpert MRSA Assay (FDA approved for NASAL specimens only), is one component of a comprehensive MRSA colonization surveillance program. It is not intended to diagnose MRSA infection nor to guide or monitor treatment for MRSA infections. Test performance is not FDA approved in patients less than 69 years old. Performed at [facility], [address] [place], [place] [address]      Radiology Studies: DG CHEST PORT 1 VIEW Result Date: 05/02/2023 CLINICAL DATA:  Fluid overload. History of sarcoidosis, COPD and hypertension. EXAM: PORTABLE CHEST 1 VIEW COMPARISON:  Radiographs 04/29/2023 and 04/25/2023.  CT 08/08/2016. FINDINGS: 0823 hours. The heart size and mediastinal contours are stable with bilateral superior hilar retraction and central enlargement of the pulmonary arteries. There is severe upper lobe predominant chronic lung disease with scarring and volume loss. Asymmetrically increased density at the left apex is unchanged from recent prior studies, although increased from 2019-05-18. No other superimposed airspace disease, significant pleural effusion or pneumothorax identified. The bones appear unchanged. Telemetry leads overlie the chest. IMPRESSION: 1. No change from recent prior studies. 2. Chronic upper lobe predominant fibrotic lung disease consistent with sarcoidosis. As discussed on recent prior studies, increased left apical density could be secondary to progressive scarring, superimposed infection or developing mass. Electronically Signed   By: Carey Bullocks M.D.   On: 05/02/2023 12:18   VAS [place]  UPPER EXTREMITY VENOUS DUPLEX Result Date: 05/02/2023 UPPER VENOUS STUDY  Patient Name:  Patricia Davies  Date of Exam:   05/01/2023 Medical Rec #: [id number]     Accession #:    [id number] Date of Birth: 1945/05/11     Patient Gender: F Patient Age:   37 years Exam Location:  [facility] - Inpatient Procedure:      VAS [place] UPPER EXTREMITY VENOUS DUPLEX Referring Phys: [facility] Alaiza Yau --------------------------------------------------------------------------------  Indications: Swelling Risk Factors: Past pregnancy. Anticoagulation: Eliquis. Limitations: Limited range of motion & small vessels. Comparison Study: None. Performing Technologist: Shona Simpson  Examination Guidelines: A complete evaluation includes B-mode imaging, spectral Doppler, color Doppler, and power Doppler as needed of all accessible portions of each vessel. Bilateral testing is considered an integral part of a complete examination. Limited examinations for reoccurring indications may be performed as noted.  Right Findings: +----------+------------+---------+-----------+------------------+-------+ RIGHT     CompressiblePhasicitySpontaneous    Properties    Summary +----------+------------+---------+-----------+------------------+-------+ IJV           Full       Yes       Yes                              +----------+------------+---------+-----------+------------------+-------+ Subclavian    Full       Yes       Yes                              +----------+------------+---------+-----------+------------------+-------+ Axillary      Full       Yes  Yes                              +----------+------------+---------+-----------+------------------+-------+ Brachial      Full       Yes       Yes                              +----------+------------+---------+-----------+------------------+-------+ Radial        Full       Yes       Yes                               +----------+------------+---------+-----------+------------------+-------+ Ulnar         Full       Yes       Yes                              +----------+------------+---------+-----------+------------------+-------+ Cephalic      None                 No     brightly echogenic Acute  +----------+------------+---------+-----------+------------------+-------+ Basilic       Full                 Yes                              +----------+------------+---------+-----------+------------------+-------+ Occlusive thrombosis seen thoughout the length of the Cephalic Vein.  Left Findings: +----+------------+---------+-----------+----------+-------+ LEFTCompressiblePhasicitySpontaneousPropertiesSummary +----+------------+---------+-----------+----------+-------+ IJV     Full       Yes       Yes                      +----+------------+---------+-----------+----------+-------+  Summary:  Right: No evidence of deep vein thrombosis in the upper extremity. Findings consistent with acute superficial vein thrombosis involving the right cephalic vein.  Left: No evidence of thrombosis in the subclavian.  *See table(s) above for measurements and observations.  Diagnosing physician: Carolynn Sayers Electronically signed by Carolynn Sayers on 05/02/2023 at 7:18:20 AM.    Final      Scheduled Meds:  Chlorhexidine Gluconate Cloth  6 each Topical QHS   feeding supplement  237 mL Oral BID BM   folic acid  1 mg Oral Daily   heparin injection (subcutaneous)  5,000 Units Subcutaneous Q8H   leptospermum manuka honey  1 Application Topical QHS   memantine  10 mg Oral BID   midodrine  5 mg Oral TID WC   mouth rinse  15 mL Mouth Rinse 4 times per day   Continuous Infusions:     LOS: 8 days   Briant Cedar, MD  05/02/2023, 5:34 PM

## 2023-05-02 NOTE — Plan of Care (Signed)
   Problem: Health Behavior/Discharge Planning: Goal: Ability to manage health-related needs will improve Outcome: Progressing

## 2023-05-02 NOTE — Progress Notes (Signed)
 Physical Therapy Treatment Patient Details Name: Patricia Davies MRN: [id number] DOB: [birth year] Today's Date: 05/02/2023   History of Present Illness 78 year old  female  with Acute metabolic encephalopathy likely secondary to multifactorial issues in the setting of severe dementia, pneumonia, shock, dehydration. pt also with multiple pressure injuries on admission.  PMH: sarcoidosis COPD hypertension, kidney stones B12 deficiency and dementia.    PT Comments  Patient indicating pain with ROM to the right foot/ankle and RUE, very limited. Right  foot positioned in plantarflexion.Patient assisted with AAROM LU/LE and  PROM RU/LE, although limited by pain..  Patient will benefit from a Right prevalon boot, order placed.   If plan is discharge home, recommend the following: A lot of help with bathing/dressing/bathroom;A lot of help with walking and/or transfers;Assistance with cooking/housework;Assist for transportation;Help with stairs or ramp for entrance;Direct supervision/assist for medications management   Can travel by private vehicle        Equipment Recommendations  Other (comment)    Recommendations for Other Services       Precautions / Restrictions Precautions Precautions: Fall Precaution/Restrictions Comments: pressure ulcers, painful L foot /ankle, painful RUE( DVT) Restrictions Weight Bearing Restrictions Per Provider Order: No     Mobility  Bed Mobility               General bed mobility comments: attrempted to move to bed edge patient resisiting and stating "no"    Transfers                        Ambulation/Gait                   Stairs             Wheelchair Mobility     Tilt Bed    Modified Rankin (Stroke Patients Only)       Balance                                            Communication Communication Communication: Impaired Factors Affecting Communication: Difficulty expressing self   Cognition Arousal: Alert Behavior During Therapy: WFL for tasks assessed/performed, Anxious   PT - Cognitive impairments: History of cognitive impairments, No family/caregiver present to determine baseline                       PT - Cognition Comments: pt. declined to  sit up , said "no,no"        Cueing Cueing Techniques: Verbal cues, Gestural cues, Visual cues, Tactile cues  Exercises      General Comments        Pertinent Vitals/Pain Pain Assessment Faces Pain Scale: Hurts whole lot Breathing: occasional labored breathing, short period of hyperventilation Negative Vocalization: occasional moan/groan, low speech, negative/disapproving quality Facial Expression: sad, frightened, frown Body Language: tense, distressed pacing, fidgeting Consolability: distracted or reassured by voice/touch PAINAD Score: 5 Pain Location: right ankle/foot Pain Intervention(s): Limited activity within patient's tolerance, Repositioned    Home Living                          Prior Function            PT Goals (current goals can now be found in the care plan section) Progress towards PT goals: Progressing toward goals    Frequency  Min 1X/week      PT Plan      Co-evaluation              AM-PAC PT "6 Clicks" Mobility   Outcome Measure  Help needed turning from your back to your side while in a flat bed without using bedrails?: Total Help needed moving from lying on your back to sitting on the side of a flat bed without using bedrails?: Total Help needed moving to and from a bed to a chair (including a wheelchair)?: Total Help needed standing up from a chair using your arms (e.g., wheelchair or bedside chair)?: Total Help needed to walk in hospital room?: Total Help needed climbing 3-5 steps with a railing? : Total 6 Click Score: 6    End of Session     Patient left: in bed;with call bell/phone within reach;with bed alarm set Nurse  Communication: Mobility status (ordered Prevalon) PT Visit Diagnosis: Other abnormalities of gait and mobility (R26.89)     Time: 2841-3244 PT Time Calculation (min) (ACUTE ONLY): 13 min  Charges:    $Therapeutic Exercise: 8-22 mins PT General Charges $$ ACUTE PT VISIT: 1 Visit                     Blanchard Kelch PT Acute Rehabilitation Services Office [phone] Weekend [phone]    Rada Hay 05/02/2023, 10:41 AM

## 2023-05-02 NOTE — TOC Progression Note (Addendum)
 Transition of Care [facility]) - Progression Note    Patient Details  Name: Patricia Davies MRN: [id number] Date of Birth: May 07, 1945  Transition [facility]) CM/SW Contact  Patricia Prows, RN Phone Number: 05/02/2023, 1:00 PM  Clinical Narrative:    Follow up call to pt's dtr Patricia Davies [phone]) regarding OP palliative care agency; she says she has not yet decided and requested TOC follow up w/ her around close of business today; will follow up.  -[phone]- follow up call to pt's dtr; she has selected [facility] for pall care; Patricia Davies, hospital liaison for agency notified via secure chat.  Expected Discharge Plan: Home/Self Care Barriers to Discharge: Continued Medical Work up  Expected Discharge Plan and Services   Discharge Planning Services: CM Consult   Living arrangements for the past 2 months: Single Family Home                 DME Arranged: Hospital bed DME Agency: [employer] Date DME Agency Contacted: 05/01/23 Time DME Agency Contacted: [phone] Representative spoke with at DME Agency: Patricia Davies HH Arranged: PT HH Agency: [facility] Date [facility] Agency Contacted: 05/01/23 Time HH Agency Contacted: [phone] Representative spoke with at [facility] Agency: Patricia Davies   Social Determinants of Health (SDOH) Interventions SDOH Screenings   Food Insecurity: No Food Insecurity (04/26/2023)  Housing: Low Risk  (04/26/2023)  Transportation Needs: No Transportation Needs (04/26/2023)  Utilities: Not At Risk (04/26/2023)  Alcohol Screen: Low Risk  (12/06/2021)  Depression (PHQ2-9): Low Risk  (12/06/2021)  Financial Resource Strain: Low Risk  (12/06/2021)  Physical Activity: Inactive (12/06/2021)  Social Connections: Unknown (12/06/2021)  Stress: No Stress Concern Present (12/06/2021)  Tobacco Use: Medium Risk (04/24/2023)    Readmission Risk Interventions     No data to display

## 2023-05-02 NOTE — Progress Notes (Signed)
  Daily Progress Note   Patient Name: JAALA BOHLE       Date: 05/02/2023 DOB: 01-08-1946  Age: 78 y.o. MRN#: [id number] Attending Physician: Briant Cedar, MD Primary Care Physician: [name], MD Admit Date: 04/24/2023 Length of Stay: 8 days  As per EMR review, care team working for patient to go home with palliative support.  TOC assisting with discharge coordination.  Please reach out if acute PMT needs arise.  Thank you  Alvester Morin, DO Palliative Care Provider PMT # [phone]  No Charge Note

## 2023-05-02 NOTE — Progress Notes (Signed)
 [facility] rm [address] [facility] Liaison note:       Notified by [facility] manager of patient/family request for [facility]iative services at home after discharge.              Hospital liaison will follow patient for discharge disposition.       Please call with any hospice or outpatient palliative care related questions.         Thank you for the opportunity to participate in this patient's care. Thea Gist, BSN, [employer] [phone]

## 2023-05-03 DIAGNOSIS — R579 Shock, unspecified: Secondary | ICD-10-CM | POA: Diagnosis not present

## 2023-05-03 NOTE — Plan of Care (Signed)
  Problem: Health Behavior/Discharge Planning: Goal: Ability to manage health-related needs will improve Outcome: Progressing   Problem: Clinical Measurements: Goal: Ability to maintain clinical measurements within normal limits will improve Outcome: Progressing Goal: Will remain free from infection Outcome: Progressing Goal: Diagnostic test results will improve Outcome: Progressing Goal: Respiratory complications will improve Outcome: Progressing Goal: Cardiovascular complication will be avoided Outcome: Progressing   Problem: Activity: Goal: Risk for activity intolerance will decrease Outcome: Progressing   Problem: Nutrition: Goal: Adequate nutrition will be maintained Outcome: Progressing   Problem: Coping: Goal: Level of anxiety will decrease Outcome: Progressing   Problem: Elimination: Goal: Will not experience complications related to bowel motility Outcome: Progressing Goal: Will not experience complications related to urinary retention Outcome: Progressing   Problem: Pain Managment: Goal: General experience of comfort will improve and/or be controlled Outcome: Progressing   Problem: Safety: Goal: Ability to remain free from injury will improve Outcome: Progressing   Problem: Skin Integrity: Goal: Risk for impaired skin integrity will decrease Outcome: Progressing   Problem: Fluid Volume: Goal: Hemodynamic stability will improve Outcome: Progressing   Problem: Clinical Measurements: Goal: Diagnostic test results will improve Outcome: Progressing Goal: Signs and symptoms of infection will decrease Outcome: Progressing   Problem: Respiratory: Goal: Ability to maintain adequate ventilation will improve Outcome: Progressing   Problem: Activity: Goal: Ability to tolerate increased activity will improve Outcome: Progressing   Problem: Clinical Measurements: Goal: Ability to maintain a body temperature in the normal range will improve Outcome:  Progressing   Problem: Respiratory: Goal: Ability to maintain adequate ventilation will improve Outcome: Progressing Goal: Ability to maintain a clear airway will improve Outcome: Progressing

## 2023-05-03 NOTE — Progress Notes (Signed)
 PROGRESS NOTE    Patricia Davies  UJW:[id number] DOB: [birth year] DOA: 04/24/2023 PCP: [name], MD   Brief Narrative:  TRH pickup 04/25/2023 Admitted by PCCM on 04/24/2023 78 year old  female lives at home with her daughter for the last 3 years due to advanced dementia.  She has a past medical history significant for sarcoidosis COPD hypertension, kidney stones B12 deficiency and dementia.  She has been struggling with dementia for the past 7 to 8 years.  Patient had gradually progressive decline in the past 3 years. Patient has very poor appetite with significant weight loss. On arrival to the emergency room she was obtunded hypotensive. Labs on admission showed sodium was 149 BUN was 59 creatinine 1.72, albumin 2.1, lactic acid 6.5, hemoglobin 7.9, platelets 132. COVID RSV and flu were negative. Chest x-ray -  Increased left apical opacity. CT head showed cerebral atrophy and no evidence of acute intracranial abnormality.  Patient admitted for further management.    Today, met patient eating breakfast, appeared comfortable.  Denies any new complaints.  Awaiting delivery of hospital bed at home prior to discharge.    Assessment & Plan:   Principal Problem:   Shock (HCC) Active Problems:   Severe dementia without behavioral disturbance, psychotic disturbance, mood disturbance, or anxiety (HCC)   Hypovolemia   Failure to thrive in adult   Need for emotional support   Counseling and coordination of care   Goals of care, counseling/discussion   Palliative care encounter   Acute metabolic encephalopathy Likely secondary to multifactorial issues in the setting of severe dementia, pneumonia, shock, dehydration Mental status improving and at baseline  Severe sepsis likely 2/2 PNA Resolved She presented obtunded initial blood pressure with a EMS was 50/60 responded to IV fluids  She was hypotensive, tachycardic, tachypneic and hypoxic with lactic acidosis in the ER. Completed 7 days  of Unasyn on 05/01/23 MRSA PCR was negative Repeat Chest xray 2/28 -Continued left upper lobe opacity is noted concerning for pneumonia, scarring or possibly underlying malignancy  Acute on chronic macrocytic anemia Hemoglobin dropped to 6.5 on 04/25/23 Partly due to hemodilution from all the fluids that she has received so far, was positive over 8 L Transfused 1 unit of packed RBC on 04/25/2023 Folate level low Continue oral folate and B12 supplementation  AKI and hypernatremia  Resolved with IV fluids secondary to dehydration poor p.o. intake  Hypokalemia Replace as needed  Failure to thrive in the setting of advanced dementia Supportive care Hospice and palliative consulted, appreciate recs  Right upper extremity SVT Likely from IV infiltration  Doppler showed SVT Keep right upper extremity elevated as much as possible, pain management prn  Multiple pressure injuries present on admission  Per wound care-Sacrum wound is 100% tightly adhered eschar; Unstageable pressure injury; 5X6cm, Left hip is dark red-purple Deep tissue pressure injury; 6X5cm Right hip is Stage 3 pressure injury; 50% red, 30% yellow, 20% dark red-purple Deep tissue pressure injury to wound edges, 3.8X1.9cm Recommending-Apply Medihoney to sacrum and right hip wounds Q day then cover with foam dressing.  Change foam dressing Q 3 days or PRN soiling Apply Xeroform gauze to left hip wound Q day and cover with foam dressing.  Change foam dressing Q 3 days or PRN soiling.   Pressure Injury 04/24/23 Hip Left Deep Tissue Pressure Injury - Purple or maroon localized area of discolored intact skin or blood-filled blister due to damage of underlying soft tissue from pressure and/or shear. (Active)  04/24/23 1806  Location: Hip  Location Orientation: Left  Staging: Deep Tissue Pressure Injury - Purple or maroon localized area of discolored intact skin or blood-filled blister due to damage of underlying soft tissue from  pressure and/or shear.  Wound Description (Comments):   Present on Admission: Yes  Dressing Type Foam - Lift dressing to assess site every shift 05/02/23 2250     Pressure Injury 04/24/23 Hip Right Stage 3 -  Full thickness tissue loss. Subcutaneous fat may be visible but bone, tendon or muscle are NOT exposed. (Active)  04/24/23 1813  Location: Hip  Location Orientation: Right  Staging: Stage 3 -  Full thickness tissue loss. Subcutaneous fat may be visible but bone, tendon or muscle are NOT exposed.  Wound Description (Comments):   Present on Admission: Yes  Dressing Type Foam - Lift dressing to assess site every shift 05/02/23 2250     Pressure Injury 04/24/23 Sacrum Unstageable - Full thickness tissue loss in which the base of the injury is covered by slough (yellow, tan, gray, green or brown) and/or eschar (tan, brown or black) in the wound bed. (Active)  04/24/23 1821  Location: Sacrum  Location Orientation:   Staging: Unstageable - Full thickness tissue loss in which the base of the injury is covered by slough (yellow, tan, gray, green or brown) and/or eschar (tan, brown or black) in the wound bed.  Wound Description (Comments):   Present on Admission: Yes  Dressing Type Foam - Lift dressing to assess site every shift 05/02/23 2250     Estimated body mass index is 19.16 kg/m as calculated from the following:   Height as of this encounter: 5' (1.524 m).   Weight as of this encounter: 44.5 kg.  DVT prophylaxis: scd Code Status: dnr Family Communication:dw son Disposition Plan:  Status is: Inpatient Remains inpatient appropriate because: medically ready for dc Consultants:  pccm  Procedures: none Antimicrobials:Completed Unasyn   Objective: Vitals:   05/02/23 1541 05/03/23 0437 05/03/23 1237 05/03/23 1311  BP: 123/72 119/68 120/79   Pulse: 87 68 85   Resp: 20 18 16    Temp: 98.4 F (36.9 C) 97.7 F (36.5 C) 98.1 F (36.7 C)   TempSrc: Oral Oral Oral   SpO2: 96%  100% 100% 100%  Weight:  44.5 kg    Height:        Intake/Output Summary (Last 24 hours) at 05/03/2023 1745 Last data filed at 05/03/2023 0835 Gross per 24 hour  Intake 517 ml  Output --  Net 517 ml   Filed Weights   05/01/23 0500 05/02/23 0105 05/03/23 0437  Weight: 45.7 kg 42.7 kg 44.5 kg    Examination: General: NAD Cardiovascular: S1, S2 present Respiratory: CTAB Abdomen: Soft, nontender, nondistended, bowel sounds present Musculoskeletal: bilateral pedal edema noted Skin: Normal Psychiatry: Appears to be normal mood    Data Reviewed: I have personally reviewed following labs and imaging studies  CBC: Recent Labs  Lab 04/27/23 0301 04/28/23 1033 04/30/23 0308 05/01/23 0308 05/02/23 0257  WBC 13.8* 13.6* 7.5 6.8 6.2  HGB 10.0* 9.8* 8.8* 8.4* 10.0*  HCT 32.5* 31.4* 27.5* 28.1* 32.5*  MCV 99.7 100.6* 97.5 104.1* 101.6*  PLT 147* 174 257 290 306   Basic Metabolic Panel: Recent Labs  Lab 04/27/23 0301 04/27/23 0437 04/27/23 0741 04/28/23 1033 04/30/23 0308 05/01/23 0308 05/02/23 0257  NA 143  --   --  142 140 138 141  K 3.3*  --   --  3.8 3.4* 3.9 3.8  CL  110  --   --  109 109 109 109  CO2 25  --   --  25 25 24 27   GLUCOSE 233*   < > 203* 90 100* 86 92  BUN 22  --   --  14 16 14 11   CREATININE 0.36*  --   --  0.37* <0.30* <0.30* 0.33*  CALCIUM 8.2*  --   --  8.6* 7.6* 7.5* 8.1*  MG  --   --  1.9  --   --   --   --    < > = values in this interval not displayed.   GFR: Estimated Creatinine Clearance: 42.7 mL/min (A) (by C-G formula based on SCr of 0.33 mg/dL (L)). Liver Function Tests: Recent Labs  Lab 04/28/23 1033 04/30/23 0308 05/02/23 0257  AST 39 25 24  ALT 39 29 23  ALKPHOS 40 43 41  BILITOT 1.5* 0.7 1.0  PROT 5.2* 4.9* 5.0*  ALBUMIN 2.3* 2.1* 2.0*   No results for input(s): "LIPASE", "AMYLASE" in the last 168 hours. No results for input(s): "AMMONIA" in the last 168 hours.  Coagulation Profile: No results for input(s): "INR",  "PROTIME" in the last 168 hours.  Cardiac Enzymes: No results for input(s): "CKTOTAL", "CKMB", "CKMBINDEX", "TROPONINI" in the last 168 hours. BNP (last 3 results) No results for input(s): "PROBNP" in the last 8760 hours. HbA1C: No results for input(s): "HGBA1C" in the last 72 hours. CBG: Recent Labs  Lab 04/26/23 2333 04/27/23 0158 04/27/23 0200 04/27/23 0231 04/27/23 0334  GLUCAP 109* 54* 46* 135* 45*   Lipid Profile: No results for input(s): "CHOL", "HDL", "LDLCALC", "TRIG", "CHOLHDL", "LDLDIRECT" in the last 72 hours. Thyroid Function Tests: No results for input(s): "TSH", "T4TOTAL", "FREET4", "T3FREE", "THYROIDAB" in the last 72 hours.  Anemia Panel: No results for input(s): "VITAMINB12", "FOLATE", "FERRITIN", "TIBC", "IRON", "RETICCTPCT" in the last 72 hours.  Sepsis Labs: No results for input(s): "PROCALCITON", "LATICACIDVEN" in the last 168 hours.   Recent Results (from the past 240 hours)  Resp panel by RT-PCR (RSV, Flu A&B, Covid) Anterior Nasal Swab     Status: None   Collection Time: 04/24/23 10:41 AM   Specimen: Anterior Nasal Swab  Result Value Ref Range Status   SARS Coronavirus 2 by RT PCR NEGATIVE NEGATIVE Final    Comment: (NOTE) SARS-CoV-2 target nucleic acids are NOT DETECTED.  The SARS-CoV-2 RNA is generally detectable in upper respiratory specimens during the acute phase of infection. The lowest concentration of SARS-CoV-2 viral copies this assay can detect is 138 copies/mL. A negative result does not preclude SARS-Cov-2 infection and should not be used as the sole basis for treatment or other patient management decisions. A negative result may occur with  improper specimen collection/handling, submission of specimen other than nasopharyngeal swab, presence of viral mutation(s) within the areas targeted by this assay, and inadequate number of viral copies(<138 copies/mL). A negative result must be combined with clinical observations, patient  history, and epidemiological information. The expected result is Negative.  Fact Sheet for Patients:  BloggerCourse.com  Fact Sheet for Healthcare Providers:  SeriousBroker.it  This test is no t yet approved or cleared by the [place] FDA and  has been authorized for detection and/or diagnosis of SARS-CoV-2 by FDA under an Emergency Use Authorization (EUA). This EUA will remain  in effect (meaning this test can be used) for the duration of the COVID-19 declaration under Section 564(b)(1) of the Act, 21 U.S.C.section 360bbb-3(b)(1), unless the authorization is terminated  or revoked sooner.       Influenza A by PCR NEGATIVE NEGATIVE Final   Influenza B by PCR NEGATIVE NEGATIVE Final    Comment: (NOTE) The Xpert Xpress SARS-CoV-2/FLU/RSV plus assay is intended as an aid in the diagnosis of influenza from Nasopharyngeal swab specimens and should not be used as a sole basis for treatment. Nasal washings and aspirates are unacceptable for Xpert Xpress SARS-CoV-2/FLU/RSV testing.  Fact Sheet for Patients: BloggerCourse.com  Fact Sheet for Healthcare Providers: SeriousBroker.it  This test is not yet approved or cleared by the [place] FDA and has been authorized for detection and/or diagnosis of SARS-CoV-2 by FDA under an Emergency Use Authorization (EUA). This EUA will remain in effect (meaning this test can be used) for the duration of the COVID-19 declaration under Section 564(b)(1) of the Act, 21 U.S.C. section 360bbb-3(b)(1), unless the authorization is terminated or revoked.     Resp Syncytial Virus by PCR NEGATIVE NEGATIVE Final    Comment: (NOTE) Fact Sheet for Patients: BloggerCourse.com  Fact Sheet for Healthcare Providers: SeriousBroker.it  This test is not yet approved or cleared by the [place] FDA  and has been authorized for detection and/or diagnosis of SARS-CoV-2 by FDA under an Emergency Use Authorization (EUA). This EUA will remain in effect (meaning this test can be used) for the duration of the COVID-19 declaration under Section 564(b)(1) of the Act, 21 U.S.C. section 360bbb-3(b)(1), unless the authorization is terminated or revoked.  Performed at [facility], [address], [place], [place] [address]   Blood Culture (routine x 2)     Status: None   Collection Time: 04/24/23 10:50 AM   Specimen: BLOOD  Result Value Ref Range Status   Specimen Description   Final    BLOOD RIGHT ANTECUBITAL Performed at [facility], [address] [place], [place] [address]    Special Requests   Final    BOTTLES DRAWN AEROBIC ONLY Blood Culture results may not be optimal due to an inadequate volume of blood received in culture bottles Performed at [facility], [address], [place], [place] [address]    Culture   Final    NO GROWTH 5 DAYS Performed at [facility], [address], [place], [place] [address]    Report Status 04/29/2023 FINAL  Final  Blood Culture (routine x 2)     Status: None   Collection Time: 04/24/23  4:00 PM   Specimen: BLOOD RIGHT ARM  Result Value Ref Range Status   Specimen Description   Final    BLOOD RIGHT ARM Performed at [facility], [address], [place], [place] [address]    Special Requests   Final    BOTTLES DRAWN AEROBIC ONLY Blood Culture results may not be optimal due to an inadequate volume of blood received in culture bottles Performed at [facility], [address], [place], [place] [address]    Culture   Final    NO GROWTH 5 DAYS Performed at [facility], [address] [place], [place] [address]    Report Status 04/29/2023 FINAL  Final  MRSA Next Gen by PCR, Nasal     Status: None   Collection Time: 04/25/23  4:32 PM    Specimen: Nasal Mucosa; Nasal Swab  Result Value Ref Range Status   MRSA by PCR Next Gen NOT DETECTED NOT DETECTED Final    Comment: (NOTE) The GeneXpert MRSA Assay (FDA  approved for NASAL specimens only), is one component of a comprehensive MRSA colonization surveillance program. It is not intended to diagnose MRSA infection nor to guide or monitor treatment for MRSA infections. Test performance is not FDA approved in patients less than 44 years old. Performed at [facility], [address], [place], [place] [address]      Radiology Studies: DG CHEST PORT 1 VIEW Result Date: 05/02/2023 CLINICAL DATA:  Fluid overload. History of sarcoidosis, COPD and hypertension. EXAM: PORTABLE CHEST 1 VIEW COMPARISON:  Radiographs 04/29/2023 and 04/25/2023.  CT 08/08/2016. FINDINGS: 0823 hours. The heart size and mediastinal contours are stable with bilateral superior hilar retraction and central enlargement of the pulmonary arteries. There is severe upper lobe predominant chronic lung disease with scarring and volume loss. Asymmetrically increased density at the left apex is unchanged from recent prior studies, although increased from May 20, 2019. No other superimposed airspace disease, significant pleural effusion or pneumothorax identified. The bones appear unchanged. Telemetry leads overlie the chest. IMPRESSION: 1. No change from recent prior studies. 2. Chronic upper lobe predominant fibrotic lung disease consistent with sarcoidosis. As discussed on recent prior studies, increased left apical density could be secondary to progressive scarring, superimposed infection or developing mass. Electronically Signed   By: Carey Bullocks M.D.   On: 05/02/2023 12:18     Scheduled Meds:  feeding supplement  237 mL Oral BID BM   folic acid  1 mg Oral Daily   heparin injection (subcutaneous)  5,000 Units Subcutaneous Q8H   leptospermum manuka honey  1 Application Topical QHS   memantine  10 mg Oral  BID   mouth rinse  15 mL Mouth Rinse 4 times per day   Continuous Infusions:     LOS: 9 days   Briant Cedar, MD  05/03/2023, 5:45 PM

## 2023-05-04 ENCOUNTER — Inpatient Hospital Stay (HOSPITAL_COMMUNITY)

## 2023-05-04 DIAGNOSIS — M7989 Other specified soft tissue disorders: Secondary | ICD-10-CM | POA: Diagnosis not present

## 2023-05-04 DIAGNOSIS — R579 Shock, unspecified: Secondary | ICD-10-CM | POA: Diagnosis not present

## 2023-05-04 MED ORDER — APIXABAN 5 MG PO TABS
10.0000 mg | ORAL_TABLET | Freq: Two times a day (BID) | ORAL | Status: AC
Start: 2023-05-05 — End: 2023-05-11
  Administered 2023-05-05 – 2023-05-11 (×13): 10 mg via ORAL
  Filled 2023-05-04 (×13): qty 2

## 2023-05-04 MED ORDER — APIXABAN 5 MG PO TABS
5.0000 mg | ORAL_TABLET | Freq: Two times a day (BID) | ORAL | Status: DC
  Administered 2023-05-11 – 2023-05-12 (×2): 5 mg via ORAL
  Filled 2023-05-04 (×2): qty 1

## 2023-05-04 MED ORDER — APIXABAN 5 MG PO TABS
10.0000 mg | ORAL_TABLET | ORAL | Status: AC
  Administered 2023-05-04: 10 mg via ORAL
  Filled 2023-05-04: qty 2

## 2023-05-04 NOTE — TOC Progression Note (Signed)
 Transition of Care [facility]) - Progression Note    Patient Details  Name: Patricia Davies MRN: [id number] Date of Birth: [birth year]  Transition of Care [facility]) CM/SW Contact  Adrian Prows, RN Phone Number: 05/04/2023, 12:50 PM  Clinical Narrative:    Venetia Constable at [facility]; he says hospital bed will be delivered to pt's home tomorrow; he will contact pt's dtr to set up delivery.   Expected Discharge Plan: Home/Self Care Barriers to Discharge: Continued Medical Work up  Expected Discharge Plan and Services   Discharge Planning Services: CM Consult   Living arrangements for the past 2 months: Single Family Home                 DME Arranged: Hospital bed DME Agency: [employer] Date DME Agency Contacted: 05/01/23 Time DME Agency Contacted: [phone] Representative spoke with at DME Agency: Vaughan Basta HH Arranged: PT HH Agency: [facility] Date [facility] Agency Contacted: 05/01/23 Time HH Agency Contacted: 1502 Representative spoke with at [facility] Agency: Amy   Social Determinants of Health (SDOH) Interventions SDOH Screenings   Food Insecurity: No Food Insecurity (04/26/2023)  Housing: Low Risk  (04/26/2023)  Transportation Needs: No Transportation Needs (04/26/2023)  Utilities: Not At Risk (04/26/2023)  Alcohol Screen: Low Risk  (12/06/2021)  Depression (PHQ2-9): Low Risk  (12/06/2021)  Financial Resource Strain: Low Risk  (12/06/2021)  Physical Activity: Inactive (12/06/2021)  Social Connections: Unknown (12/06/2021)  Stress: No Stress Concern Present (12/06/2021)  Tobacco Use: Medium Risk (04/24/2023)    Readmission Risk Interventions     No data to display

## 2023-05-04 NOTE — Progress Notes (Signed)
 PROGRESS NOTE    ETNA FORQUER  QIH:[id number] DOB: [birth year] DOA: 04/24/2023 PCP: [name], MD   Brief Narrative:  TRH pickup 04/25/2023 Admitted by PCCM on 04/24/2023 78 year old  female lives at home with her daughter for the last 3 years due to advanced dementia.  She has a past medical history significant for sarcoidosis COPD hypertension, kidney stones B12 deficiency and dementia.  She has been struggling with dementia for the past 7 to 8 years.  Patient had gradually progressive decline in the past 3 years. Patient has very poor appetite with significant weight loss. On arrival to the emergency room she was obtunded hypotensive. Labs on admission showed sodium was 149 BUN was 59 creatinine 1.72, albumin 2.1, lactic acid 6.5, hemoglobin 7.9, platelets 132. COVID RSV and flu were negative. Chest x-ray -  Increased left apical opacity. CT head showed cerebral atrophy and no evidence of acute intracranial abnormality.  Patient admitted for further management.     Today patient appears comfortable.  Noted to have acute DVT.    Assessment & Plan:   Principal Problem:   Shock (HCC) Active Problems:   Severe dementia without behavioral disturbance, psychotic disturbance, mood disturbance, or anxiety (HCC)   Hypovolemia   Failure to thrive in adult   Need for emotional support   Counseling and coordination of care   Goals of care, counseling/discussion   Palliative care encounter   Acute metabolic encephalopathy Likely secondary to multifactorial issues in the setting of severe dementia, pneumonia, shock, dehydration Mental status improving and at baseline  Severe sepsis likely 2/2 PNA Resolved She presented obtunded initial blood pressure with a EMS was 50/60 responded to IV fluids  She was hypotensive, tachycardic, tachypneic and hypoxic with lactic acidosis in the ER. Completed 7 days of Unasyn on 05/01/23 MRSA PCR was negative Repeat Chest xray 2/28 -Continued left  upper lobe opacity is noted concerning for pneumonia, scarring or possibly underlying malignancy  Acute DVT in the right femoral vein Noted bilateral lower extremity edema BLE Doppler showed acute DVT involving the right femoral vein, with age indeterminant DVT in the right common femoral vein, left lower extremity with no evidence of DVT Start Eliquis  Acute on chronic macrocytic anemia Hemoglobin dropped to 6.5 on 04/25/23 Partly due to hemodilution from all the fluids that she has received so far, was positive over 8 L Transfused 1 unit of packed RBC on 04/25/2023 Folate level low Continue oral folate and B12 supplementation  AKI and hypernatremia  Resolved with IV fluids secondary to dehydration poor p.o. intake  Hypokalemia Replace as needed  Failure to thrive in the setting of advanced dementia Supportive care Hospice and palliative consulted, appreciate recs  Right upper extremity SVT Likely from IV infiltration  Doppler showed SVT Keep right upper extremity elevated as much as possible, pain management prn  Multiple pressure injuries present on admission  Per wound care-Sacrum wound is 100% tightly adhered eschar; Unstageable pressure injury; 5X6cm, Left hip is dark red-purple Deep tissue pressure injury; 6X5cm Right hip is Stage 3 pressure injury; 50% red, 30% yellow, 20% dark red-purple Deep tissue pressure injury to wound edges, 3.8X1.9cm Recommending-Apply Medihoney to sacrum and right hip wounds Q day then cover with foam dressing.  Change foam dressing Q 3 days or PRN soiling Apply Xeroform gauze to left hip wound Q day and cover with foam dressing.  Change foam dressing Q 3 days or PRN soiling.   Pressure Injury 04/24/23 Hip Left Deep Tissue  Pressure Injury - Purple or maroon localized area of discolored intact skin or blood-filled blister due to damage of underlying soft tissue from pressure and/or shear. (Active)  04/24/23 1806  Location: Hip  Location  Orientation: Left  Staging: Deep Tissue Pressure Injury - Purple or maroon localized area of discolored intact skin or blood-filled blister due to damage of underlying soft tissue from pressure and/or shear.  Wound Description (Comments):   Present on Admission: Yes  Dressing Type Foam - Lift dressing to assess site every shift 05/02/23 2250     Pressure Injury 04/24/23 Hip Right Stage 3 -  Full thickness tissue loss. Subcutaneous fat may be visible but bone, tendon or muscle are NOT exposed. (Active)  04/24/23 1813  Location: Hip  Location Orientation: Right  Staging: Stage 3 -  Full thickness tissue loss. Subcutaneous fat may be visible but bone, tendon or muscle are NOT exposed.  Wound Description (Comments):   Present on Admission: Yes  Dressing Type Foam - Lift dressing to assess site every shift 05/04/23 0419     Pressure Injury 04/24/23 Sacrum Unstageable - Full thickness tissue loss in which the base of the injury is covered by slough (yellow, tan, gray, green or brown) and/or eschar (tan, brown or black) in the wound bed. (Active)  04/24/23 1821  Location: Sacrum  Location Orientation:   Staging: Unstageable - Full thickness tissue loss in which the base of the injury is covered by slough (yellow, tan, gray, green or brown) and/or eschar (tan, brown or black) in the wound bed.  Wound Description (Comments):   Present on Admission: Yes  Dressing Type Foam - Lift dressing to assess site every shift 05/04/23 0419     Estimated body mass index is 19.16 kg/m as calculated from the following:   Height as of this encounter: 5' (1.524 m).   Weight as of this encounter: 44.5 kg.  DVT prophylaxis: scd Code Status: dnr Family Communication:dw son Disposition Plan:  Status is: Inpatient Remains inpatient appropriate because: medically ready for dc Consultants:  pccm  Procedures: none Antimicrobials:Completed Unasyn   Objective: Vitals:   05/03/23 1311 05/03/23 2029 05/04/23  0513 05/04/23 1155  BP:  124/71 134/72 118/67  Pulse:  82 73 88  Resp:  18 20 18   Temp:  99 F (37.2 C) 98.5 F (36.9 C) 98.3 F (36.8 C)  TempSrc:    Oral  SpO2: 100% 99% 99% 100%  Weight:      Height:        Intake/Output Summary (Last 24 hours) at 05/04/2023 1613 Last data filed at 05/04/2023 1300 Gross per 24 hour  Intake 960 ml  Output 1025 ml  Net -65 ml   Filed Weights   05/01/23 0500 05/02/23 0105 05/03/23 0437  Weight: 45.7 kg 42.7 kg 44.5 kg    Examination: General: NAD Cardiovascular: S1, S2 present Respiratory: CTAB Abdomen: Soft, nontender, nondistended, bowel sounds present Musculoskeletal: bilateral pedal edema noted Skin: Normal Psychiatry: Appears to be normal mood    Data Reviewed: I have personally reviewed following labs and imaging studies  CBC: Recent Labs  Lab 04/28/23 1033 04/30/23 0308 05/01/23 0308 05/02/23 0257  WBC 13.6* 7.5 6.8 6.2  HGB 9.8* 8.8* 8.4* 10.0*  HCT 31.4* 27.5* 28.1* 32.5*  MCV 100.6* 97.5 104.1* 101.6*  PLT 174 257 290 306   Basic Metabolic Panel: Recent Labs  Lab 04/28/23 1033 04/30/23 0308 05/01/23 0308 05/02/23 0257  NA 142 140 138 141  K 3.8 3.4*  3.9 3.8  CL 109 109 109 109  CO2 25 25 24 27   GLUCOSE 90 100* 86 92  BUN 14 16 14 11   CREATININE 0.37* <0.30* <0.30* 0.33*  CALCIUM 8.6* 7.6* 7.5* 8.1*   GFR: Estimated Creatinine Clearance: 42.7 mL/min (A) (by C-G formula based on SCr of 0.33 mg/dL (L)). Liver Function Tests: Recent Labs  Lab 04/28/23 1033 04/30/23 0308 05/02/23 0257  AST 39 25 24  ALT 39 29 23  ALKPHOS 40 43 41  BILITOT 1.5* 0.7 1.0  PROT 5.2* 4.9* 5.0*  ALBUMIN 2.3* 2.1* 2.0*   No results for input(s): "LIPASE", "AMYLASE" in the last 168 hours. No results for input(s): "AMMONIA" in the last 168 hours.  Coagulation Profile: No results for input(s): "INR", "PROTIME" in the last 168 hours.  Cardiac Enzymes: No results for input(s): "CKTOTAL", "CKMB", "CKMBINDEX", "TROPONINI"  in the last 168 hours. BNP (last 3 results) No results for input(s): "PROBNP" in the last 8760 hours. HbA1C: No results for input(s): "HGBA1C" in the last 72 hours. CBG: No results for input(s): "GLUCAP" in the last 168 hours.  Lipid Profile: No results for input(s): "CHOL", "HDL", "LDLCALC", "TRIG", "CHOLHDL", "LDLDIRECT" in the last 72 hours. Thyroid Function Tests: No results for input(s): "TSH", "T4TOTAL", "FREET4", "T3FREE", "THYROIDAB" in the last 72 hours.  Anemia Panel: No results for input(s): "VITAMINB12", "FOLATE", "FERRITIN", "TIBC", "IRON", "RETICCTPCT" in the last 72 hours.  Sepsis Labs: No results for input(s): "PROCALCITON", "LATICACIDVEN" in the last 168 hours.   Recent Results (from the past 240 hours)  MRSA Next Gen by PCR, Nasal     Status: None   Collection Time: 04/25/23  4:32 PM   Specimen: Nasal Mucosa; Nasal Swab  Result Value Ref Range Status   MRSA by PCR Next Gen NOT DETECTED NOT DETECTED Final    Comment: (NOTE) The GeneXpert MRSA Assay (FDA approved for NASAL specimens only), is one component of a comprehensive MRSA colonization surveillance program. It is not intended to diagnose MRSA infection nor to guide or monitor treatment for MRSA infections. Test performance is not FDA approved in patients less than 60 years old. Performed at [facility], [address] [place], [place] [address]      Radiology Studies: VAS [place] LOWER EXTREMITY VENOUS (DVT) Result Date: 05/04/2023  Lower Venous DVT Study Patient Name:  KLOVER PRIESTLY  Date of Exam:   05/04/2023 Medical Rec #: [id number]     Accession #:    [id number] Date of Birth: 11-Feb-1946     Patient Gender: F Patient Age:   47 years Exam Location:  [facility] Procedure:      VAS [place] LOWER EXTREMITY VENOUS (DVT) Referring Phys: [facility] Briceida Rasberry --------------------------------------------------------------------------------  Indications: Swelling, Edema, and Recent SVT in right  upper extremity.  Comparison Study: No prior exam. Performing Technologist: Fernande Bras  Examination Guidelines: A complete evaluation includes B-mode imaging, spectral Doppler, color Doppler, and power Doppler as needed of all accessible portions of each vessel. Bilateral testing is considered an integral part of a complete examination. Limited examinations for reoccurring indications may be performed as noted. The reflux portion of the exam is performed with the patient in reverse Trendelenburg.  +---------+---------------+---------+-----------+----------+-----------------+ RIGHT    CompressibilityPhasicitySpontaneityPropertiesThrombus Aging    +---------+---------------+---------+-----------+----------+-----------------+ CFV      Partial        Yes      Yes  Age Indeterminate +---------+---------------+---------+-----------+----------+-----------------+ SFJ      Full           Yes      Yes                                    +---------+---------------+---------+-----------+----------+-----------------+ FV Prox  Partial        Yes      Yes                  Acute             +---------+---------------+---------+-----------+----------+-----------------+ FV Mid   Full                                                           +---------+---------------+---------+-----------+----------+-----------------+ FV DistalFull                                                           +---------+---------------+---------+-----------+----------+-----------------+ PFV      Full                                                           +---------+---------------+---------+-----------+----------+-----------------+ POP      Full           No       Yes                                    +---------+---------------+---------+-----------+----------+-----------------+ PTV      Full                                                            +---------+---------------+---------+-----------+----------+-----------------+ PERO     Full                                                           +---------+---------------+---------+-----------+----------+-----------------+ Acute thrombus noted in a short segment of the proximal femoral vein and an age indeterminate thrombus oscillating within the common femoral vein noted.  +---------+---------------+---------+-----------+----------+--------------+ LEFT     CompressibilityPhasicitySpontaneityPropertiesThrombus Aging +---------+---------------+---------+-----------+----------+--------------+ CFV      Full           Yes      Yes                                 +---------+---------------+---------+-----------+----------+--------------+ SFJ      Full  Yes      Yes                                 +---------+---------------+---------+-----------+----------+--------------+ FV Prox  Full                                                        +---------+---------------+---------+-----------+----------+--------------+ FV Mid   Full                                                        +---------+---------------+---------+-----------+----------+--------------+ FV DistalFull                                                        +---------+---------------+---------+-----------+----------+--------------+ PFV      Full                                                        +---------+---------------+---------+-----------+----------+--------------+ POP      Full           Yes      Yes                                 +---------+---------------+---------+-----------+----------+--------------+ PTV      Full                                                        +---------+---------------+---------+-----------+----------+--------------+ PERO     Full                                                         +---------+---------------+---------+-----------+----------+--------------+    Summary: RIGHT: - Findings consistent with acute deep vein thrombosis involving the right femoral vein.  - Findings consistent with age indeterminate deep vein thrombosis involving the right common femoral vein.  - No cystic structure found in the popliteal fossa.  LEFT: - There is no evidence of deep vein thrombosis in the lower extremity.  - No cystic structure found in the popliteal fossa.  *See table(s) above for measurements and observations.    Preliminary      Scheduled Meds:  feeding supplement  237 mL Oral BID BM   folic acid  1 mg Oral Daily   heparin injection (subcutaneous)  5,000 Units Subcutaneous Q8H   leptospermum manuka honey  1 Application Topical QHS   memantine  10 mg Oral BID  mouth rinse  15 mL Mouth Rinse 4 times per day   Continuous Infusions:     LOS: 10 days   Briant Cedar, MD  05/04/2023, 4:13 PM

## 2023-05-04 NOTE — Plan of Care (Signed)
  Problem: Health Behavior/Discharge Planning: Goal: Ability to manage health-related needs will improve Outcome: Progressing   Problem: Clinical Measurements: Goal: Ability to maintain clinical measurements within normal limits will improve Outcome: Progressing Goal: Will remain free from infection Outcome: Progressing Goal: Diagnostic test results will improve Outcome: Progressing Goal: Respiratory complications will improve Outcome: Progressing Goal: Cardiovascular complication will be avoided Outcome: Progressing   Problem: Activity: Goal: Risk for activity intolerance will decrease Outcome: Progressing   Problem: Nutrition: Goal: Adequate nutrition will be maintained Outcome: Progressing   Problem: Coping: Goal: Level of anxiety will decrease Outcome: Progressing   Problem: Elimination: Goal: Will not experience complications related to bowel motility Outcome: Progressing Goal: Will not experience complications related to urinary retention Outcome: Progressing   Problem: Pain Managment: Goal: General experience of comfort will improve and/or be controlled Outcome: Progressing   Problem: Safety: Goal: Ability to remain free from injury will improve Outcome: Progressing   Problem: Skin Integrity: Goal: Risk for impaired skin integrity will decrease Outcome: Progressing   Problem: Fluid Volume: Goal: Hemodynamic stability will improve Outcome: Progressing   Problem: Clinical Measurements: Goal: Diagnostic test results will improve Outcome: Progressing Goal: Signs and symptoms of infection will decrease Outcome: Progressing   Problem: Respiratory: Goal: Ability to maintain adequate ventilation will improve Outcome: Progressing   Problem: Activity: Goal: Ability to tolerate increased activity will improve Outcome: Progressing   Problem: Clinical Measurements: Goal: Ability to maintain a body temperature in the normal range will improve Outcome:  Progressing   Problem: Respiratory: Goal: Ability to maintain adequate ventilation will improve Outcome: Progressing Goal: Ability to maintain a clear airway will improve Outcome: Progressing

## 2023-05-04 NOTE — Progress Notes (Signed)
 Bilateral lower extremity venous duplex has been completed.  Results can be found in chart review under CV Proc.  05/04/2023 3:52 PM  Fernande Bras, RVT.

## 2023-05-04 NOTE — Discharge Instructions (Addendum)
 Information on my medicine - ELIQUIS (apixaban)  Why was Eliquis prescribed for you? Eliquis was prescribed to treat blood clots that may have been found in the veins of your legs (deep vein thrombosis) or in your lungs (pulmonary embolism) and to reduce the risk of them occurring again.  What do You need to know about Eliquis ? Take ONE 5 mg tablet taken TWICE daily.  Eliquis may be taken with or without food.   Try to take the dose about the same time in the morning and in the evening. If you have difficulty swallowing the tablet whole please discuss with your pharmacist how to take the medication safely.  Take Eliquis exactly as prescribed and DO NOT stop taking Eliquis without talking to the doctor who prescribed the medication.  Stopping may increase your risk of developing a new blood clot.  Refill your prescription before you run out.  After discharge, you should have regular check-up appointments with your healthcare provider that is prescribing your Eliquis.    What do you do if you miss a dose? If a dose of ELIQUIS is not taken at the scheduled time, take it as soon as possible on the same day and twice-daily administration should be resumed. The dose should not be doubled to make up for a missed dose.  Important Safety Information A possible side effect of Eliquis is bleeding. You should call your healthcare provider right away if you experience any of the following: Bleeding from an injury or your nose that does not stop. Unusual colored urine (red or dark brown) or unusual colored stools (red or black). Unusual bruising for unknown reasons. A serious fall or if you hit your head (even if there is no bleeding).  Some medicines may interact with Eliquis and might increase your risk of bleeding or clotting while on Eliquis. To help avoid this, consult your healthcare provider or pharmacist prior to using any new prescription or non-prescription medications, including  herbals, vitamins, non-steroidal anti-inflammatory drugs (NSAIDs) and supplements.  This website has more information on Eliquis (apixaban): http://www.eliquis.com/eliquis/home

## 2023-05-04 NOTE — Progress Notes (Signed)
 PHARMACY - ANTICOAGULATION CONSULT NOTE  Pharmacy Consult for Eliquis Indication: DVT  No Known Allergies  Patient Measurements: Height: 5' (152.4 cm) Weight: 44.5 kg (98 lb 1.7 oz) IBW/kg (Calculated) : 45.5  Vital Signs: Temp: 98.3 F (36.8 C) (03/02 1155) Temp Source: Oral (03/02 1155) BP: 118/67 (03/02 1155) Pulse Rate: 88 (03/02 1155)  Labs: Recent Labs    05/02/23 0257  HGB 10.0*  HCT 32.5*  PLT 306  CREATININE 0.33*    Estimated Creatinine Clearance: 42.7 mL/min (A) (by C-G formula based on SCr of 0.33 mg/dL (L)).   Medical History: Past Medical History:  Diagnosis Date   COPD (chronic obstructive pulmonary disease) (HCC)    History of nephrolithiasis    Hypertension    Sarcoidosis of skin    Vitamin B12 deficiency     Assessment: AC/Heme: acute on chronic macrocytic anemia- , Transfusing 2/21 for Hgb 6.5. No SCDs per MD -HSQ stopped 2/20, resumed 2/25 - Hgb stable for 3 days>resumed SQ heparin 2/25>3/2 - Hgb up to 10, Plts WNL and rising - 3/3 BLE Dopplers showed acute DVT involving the right femoral vein, with age indeterminant DVT in the right common femoral vein   Goal of Therapy:  Therapeutic oral anticoagulation  Plan:  D/c SQ heparin Eliquis 10mg  BID x 7d, then 5mg  BID   Patricia Davies, PharmD, BCPS Clinical Staff Pharmacist Misty Stanley Stillinger 05/04/2023,4:38 PM

## 2023-05-05 DIAGNOSIS — R579 Shock, unspecified: Secondary | ICD-10-CM | POA: Diagnosis not present

## 2023-05-05 LAB — CBC
HCT: 26 % — ABNORMAL LOW (ref 36.0–46.0)
Hemoglobin: 7.9 g/dL — ABNORMAL LOW (ref 12.0–15.0)
MCH: 31.1 pg (ref 26.0–34.0)
MCHC: 30.4 g/dL (ref 30.0–36.0)
MCV: 102.4 fL — ABNORMAL HIGH (ref 80.0–100.0)
Platelets: 363 10*3/uL (ref 150–400)
RBC: 2.54 MIL/uL — ABNORMAL LOW (ref 3.87–5.11)
RDW: 18.6 % — ABNORMAL HIGH (ref 11.5–15.5)
WBC: 9.2 10*3/uL (ref 4.0–10.5)
nRBC: 0 % (ref 0.0–0.2)

## 2023-05-05 NOTE — Progress Notes (Signed)
 PROGRESS NOTE    Patricia Davies  HQI:[id number] DOB: 1945-09-02 DOA: 04/24/2023 PCP: [name], MD   Brief Narrative:  TRH pickup 04/25/2023 Admitted by PCCM on 04/24/2023 78 year old  female lives at home with her daughter for the last 3 years due to advanced dementia.  She has a past medical history significant for sarcoidosis COPD hypertension, kidney stones B12 deficiency and dementia.  She has been struggling with dementia for the past 7 to 8 years.  Patient had gradually progressive decline in the past 3 years. Patient has very poor appetite with significant weight loss. On arrival to the emergency room she was obtunded hypotensive. Labs on admission showed sodium was 149 BUN was 59 creatinine 1.72, albumin 2.1, lactic acid 6.5, hemoglobin 7.9, platelets 132. COVID RSV and flu were negative. Chest x-ray -  Increased left apical opacity. CT head showed cerebral atrophy and no evidence of acute intracranial abnormality.  Patient admitted for further management.     Met Nursing tech feeding patient, appears comfortable.  Attempted to call patient's son/daughter, no answer.  Apparently TOC was notified about patient's daughter postponing hospital bed delivery.    Assessment & Plan:   Principal Problem:   Shock (HCC) Active Problems:   Severe dementia without behavioral disturbance, psychotic disturbance, mood disturbance, or anxiety (HCC)   Hypovolemia   Failure to thrive in adult   Need for emotional support   Counseling and coordination of care   Goals of care, counseling/discussion   Palliative care encounter   Acute metabolic encephalopathy Likely secondary to multifactorial issues in the setting of severe dementia, pneumonia, shock, dehydration Mental status improving and at baseline  Severe sepsis likely 2/2 PNA Resolved She presented obtunded initial blood pressure with a EMS was 50/60 responded to IV fluids  She was hypotensive, tachycardic, tachypneic and hypoxic  with lactic acidosis in the ER. Completed 7 days of Unasyn on 05/01/23 MRSA PCR was negative Repeat Chest xray 2/28 -Continued left upper lobe opacity is noted concerning for pneumonia, scarring or possibly underlying malignancy  Acute DVT in the right femoral vein Noted bilateral lower extremity edema BLE Doppler showed acute DVT involving the right femoral vein, with age indeterminant DVT in the right common femoral vein, left lower extremity with no evidence of DVT Start Eliquis  Acute on chronic macrocytic anemia Hemoglobin dropped to 6.5 on 04/25/23, currently trending down Partly due to hemodilution from all the fluids that she has received so far, was positive over 8 L Transfused 1 unit of packed RBC on 04/25/2023 Folate level low Continue oral folate and B12 supplementation Daily CBC while on eliquis  AKI and hypernatremia  Resolved with IV fluids secondary to dehydration poor p.o. intake  Hypokalemia Replace as needed  Failure to thrive in the setting of advanced dementia Supportive care Hospice and palliative consulted, appreciate recs  Right upper extremity SVT Likely from IV infiltration  Doppler showed SVT Keep right upper extremity elevated as much as possible, pain management prn  Multiple pressure injuries present on admission  Per wound care-Sacrum wound is 100% tightly adhered eschar; Unstageable pressure injury; 5X6cm, Left hip is dark red-purple Deep tissue pressure injury; 6X5cm Right hip is Stage 3 pressure injury; 50% red, 30% yellow, 20% dark red-purple Deep tissue pressure injury to wound edges, 3.8X1.9cm Recommending-Apply Medihoney to sacrum and right hip wounds Q day then cover with foam dressing.  Change foam dressing Q 3 days or PRN soiling Apply Xeroform gauze to left hip wound Q  day and cover with foam dressing.  Change foam dressing Q 3 days or PRN soiling.   Pressure Injury 04/24/23 Hip Left Deep Tissue Pressure Injury - Purple or maroon  localized area of discolored intact skin or blood-filled blister due to damage of underlying soft tissue from pressure and/or shear. (Active)  04/24/23 1806  Location: Hip  Location Orientation: Left  Staging: Deep Tissue Pressure Injury - Purple or maroon localized area of discolored intact skin or blood-filled blister due to damage of underlying soft tissue from pressure and/or shear.  Wound Description (Comments):   Present on Admission: Yes  Dressing Type Foam - Lift dressing to assess site every shift 05/02/23 2250     Pressure Injury 04/24/23 Hip Right Stage 3 -  Full thickness tissue loss. Subcutaneous fat may be visible but bone, tendon or muscle are NOT exposed. (Active)  04/24/23 1813  Location: Hip  Location Orientation: Right  Staging: Stage 3 -  Full thickness tissue loss. Subcutaneous fat may be visible but bone, tendon or muscle are NOT exposed.  Wound Description (Comments):   Present on Admission: Yes  Dressing Type Foam - Lift dressing to assess site every shift 05/04/23 0419     Pressure Injury 04/24/23 Sacrum Unstageable - Full thickness tissue loss in which the base of the injury is covered by slough (yellow, tan, gray, green or brown) and/or eschar (tan, brown or black) in the wound bed. (Active)  04/24/23 1821  Location: Sacrum  Location Orientation:   Staging: Unstageable - Full thickness tissue loss in which the base of the injury is covered by slough (yellow, tan, gray, green or brown) and/or eschar (tan, brown or black) in the wound bed.  Wound Description (Comments):   Present on Admission: Yes  Dressing Type Foam - Lift dressing to assess site every shift 05/05/23 0400     Estimated body mass index is 18.94 kg/m as calculated from the following:   Height as of this encounter: 5' (1.524 m).   Weight as of this encounter: 44 kg.  DVT prophylaxis: scd Code Status: dnr Family Communication:dw son Disposition Plan:  Status is: Inpatient Remains inpatient  appropriate because: medically ready for dc Consultants:  pccm  Procedures: none Antimicrobials:Completed Unasyn   Objective: Vitals:   05/04/23 2040 05/05/23 0500 05/05/23 0516 05/05/23 1134  BP: 120/76  129/65 130/72  Pulse: 87  92 98  Resp: 20  20 18   Temp: 98.6 F (37 C)  98.3 F (36.8 C) 99.2 F (37.3 C)  TempSrc: Oral  Oral Oral  SpO2: 99%  97% 97%  Weight:  44 kg    Height:        Intake/Output Summary (Last 24 hours) at 05/05/2023 1705 Last data filed at 05/05/2023 1300 Gross per 24 hour  Intake 960 ml  Output --  Net 960 ml   Filed Weights   05/02/23 0105 05/03/23 0437 05/05/23 0500  Weight: 42.7 kg 44.5 kg 44 kg    Examination: General: NAD Cardiovascular: S1, S2 present Respiratory: CTAB Abdomen: Soft, nontender, nondistended, bowel sounds present Musculoskeletal: No pedal edema noted Skin: Normal Psychiatry: Appears to be normal mood    Data Reviewed: I have personally reviewed following labs and imaging studies  CBC: Recent Labs  Lab 04/30/23 0308 05/01/23 0308 05/02/23 0257 05/05/23 0451  WBC 7.5 6.8 6.2 9.2  HGB 8.8* 8.4* 10.0* 7.9*  HCT 27.5* 28.1* 32.5* 26.0*  MCV 97.5 104.1* 101.6* 102.4*  PLT 257 290 306 363  Basic Metabolic Panel: Recent Labs  Lab 04/30/23 0308 05/01/23 0308 05/02/23 0257  NA 140 138 141  K 3.4* 3.9 3.8  CL 109 109 109  CO2 25 24 27   GLUCOSE 100* 86 92  BUN 16 14 11   CREATININE <0.30* <0.30* 0.33*  CALCIUM 7.6* 7.5* 8.1*   GFR: Estimated Creatinine Clearance: 42.2 mL/min (A) (by C-G formula based on SCr of 0.33 mg/dL (L)). Liver Function Tests: Recent Labs  Lab 04/30/23 0308 05/02/23 0257  AST 25 24  ALT 29 23  ALKPHOS 43 41  BILITOT 0.7 1.0  PROT 4.9* 5.0*  ALBUMIN 2.1* 2.0*   No results for input(s): "LIPASE", "AMYLASE" in the last 168 hours. No results for input(s): "AMMONIA" in the last 168 hours.  Coagulation Profile: No results for input(s): "INR", "PROTIME" in the last 168  hours.  Cardiac Enzymes: No results for input(s): "CKTOTAL", "CKMB", "CKMBINDEX", "TROPONINI" in the last 168 hours. BNP (last 3 results) No results for input(s): "PROBNP" in the last 8760 hours. HbA1C: No results for input(s): "HGBA1C" in the last 72 hours. CBG: No results for input(s): "GLUCAP" in the last 168 hours.  Lipid Profile: No results for input(s): "CHOL", "HDL", "LDLCALC", "TRIG", "CHOLHDL", "LDLDIRECT" in the last 72 hours. Thyroid Function Tests: No results for input(s): "TSH", "T4TOTAL", "FREET4", "T3FREE", "THYROIDAB" in the last 72 hours.  Anemia Panel: No results for input(s): "VITAMINB12", "FOLATE", "FERRITIN", "TIBC", "IRON", "RETICCTPCT" in the last 72 hours.  Sepsis Labs: No results for input(s): "PROCALCITON", "LATICACIDVEN" in the last 168 hours.   No results found for this or any previous visit (from the past 240 hours).    Radiology Studies: VAS [place] LOWER EXTREMITY VENOUS (DVT) Result Date: 05/04/2023  Lower Venous DVT Study Patient Name:  MIKELL CAMP  Date of Exam:   05/04/2023 Medical Rec #: [id number]     Accession #:    [id number] Date of Birth: 1945/12/23     Patient Gender: F Patient Age:   22 years Exam Location:  [facility] Procedure:      VAS [place] LOWER EXTREMITY VENOUS (DVT) Referring Phys: [facility] Jerrye Seebeck --------------------------------------------------------------------------------  Indications: Swelling, Edema, and Recent SVT in right upper extremity.  Comparison Study: No prior exam. Performing Technologist: Fernande Bras  Examination Guidelines: A complete evaluation includes B-mode imaging, spectral Doppler, color Doppler, and power Doppler as needed of all accessible portions of each vessel. Bilateral testing is considered an integral part of a complete examination. Limited examinations for reoccurring indications may be performed as noted. The reflux portion of the exam is performed with the patient in reverse Trendelenburg.   +---------+---------------+---------+-----------+----------+-----------------+ RIGHT    CompressibilityPhasicitySpontaneityPropertiesThrombus Aging    +---------+---------------+---------+-----------+----------+-----------------+ CFV      Partial        Yes      Yes                  Age Indeterminate +---------+---------------+---------+-----------+----------+-----------------+ SFJ      Full           Yes      Yes                                    +---------+---------------+---------+-----------+----------+-----------------+ FV Prox  Partial        Yes      Yes                  Acute             +---------+---------------+---------+-----------+----------+-----------------+  FV Mid   Full                                                           +---------+---------------+---------+-----------+----------+-----------------+ FV DistalFull                                                           +---------+---------------+---------+-----------+----------+-----------------+ PFV      Full                                                           +---------+---------------+---------+-----------+----------+-----------------+ POP      Full           No       Yes                                    +---------+---------------+---------+-----------+----------+-----------------+ PTV      Full                                                           +---------+---------------+---------+-----------+----------+-----------------+ PERO     Full                                                           +---------+---------------+---------+-----------+----------+-----------------+ Acute thrombus noted in a short segment of the proximal femoral vein and an age indeterminate thrombus oscillating within the common femoral vein noted.  +---------+---------------+---------+-----------+----------+--------------+ LEFT      CompressibilityPhasicitySpontaneityPropertiesThrombus Aging +---------+---------------+---------+-----------+----------+--------------+ CFV      Full           Yes      Yes                                 +---------+---------------+---------+-----------+----------+--------------+ SFJ      Full           Yes      Yes                                 +---------+---------------+---------+-----------+----------+--------------+ FV Prox  Full                                                        +---------+---------------+---------+-----------+----------+--------------+ FV Mid   Full                                                        +---------+---------------+---------+-----------+----------+--------------+  FV DistalFull                                                        +---------+---------------+---------+-----------+----------+--------------+ PFV      Full                                                        +---------+---------------+---------+-----------+----------+--------------+ POP      Full           No       Yes                                 +---------+---------------+---------+-----------+----------+--------------+ PTV      Full                                                        +---------+---------------+---------+-----------+----------+--------------+ PERO     Full                                                        +---------+---------------+---------+-----------+----------+--------------+     Summary: RIGHT: - Findings consistent with acute deep vein thrombosis involving the right femoral vein.  - Findings consistent with age indeterminate deep vein thrombosis involving the right common femoral vein.  - No cystic structure found in the popliteal fossa.  LEFT: - There is no evidence of deep vein thrombosis in the lower extremity.  - No cystic structure found in the popliteal fossa.  *See table(s) above for measurements  and observations. Electronically signed by Coral Else MD on 05/04/2023 at 9:27:44 PM.    Final      Scheduled Meds:  apixaban  10 mg Oral Q12H   Followed by   Melene Muller ON 05/11/2023] apixaban  5 mg Oral Q12H   feeding supplement  237 mL Oral BID BM   folic acid  1 mg Oral Daily   leptospermum manuka honey  1 Application Topical QHS   memantine  10 mg Oral BID   mouth rinse  15 mL Mouth Rinse 4 times per day   Continuous Infusions:     LOS: 11 days   Briant Cedar, MD  05/05/2023, 5:05 PM

## 2023-05-05 NOTE — TOC Progression Note (Signed)
 Transition of Care [facility]) - Progression Note    Patient Details  Name: Patricia Davies MRN: [id number] Date of Birth: April 08, 1945  [facility]) CM/SW Contact  Larrie Kass, LCSW Phone Number: 05/05/2023, 4:46 PM  Clinical Narrative:     CSW spoke with a [employer] representative, to follow up about hospital bed delivery. He stated that the pt's daughter has refused the hospital bed on Friday and today. CSW attempted to contact both the p's daughter and son, but there was no answer. A HIPAA-compliant voicemail was left, requesting a return call. TOC to follow.  Expected Discharge Plan: Home/Self Care Barriers to Discharge: Continued Medical Work up  Expected Discharge Plan and Services   Discharge Planning Services: CM Consult   Living arrangements for the past 2 months: Single Family Home                 DME Arranged: Hospital bed DME Agency: [employer] Date DME Agency Contacted: 05/01/23 Time DME Agency Contacted: [phone] Representative spoke with at DME Agency: Vaughan Basta HH Arranged: PT HH Agency: [facility] Date [facility] Agency Contacted: 05/01/23 Time HH Agency Contacted: [phone] Representative spoke with at [facility] Agency: Amy   Social Determinants of Health (SDOH) Interventions SDOH Screenings   Food Insecurity: No Food Insecurity (04/26/2023)  Housing: Low Risk  (04/26/2023)  Transportation Needs: No Transportation Needs (04/26/2023)  Utilities: Not At Risk (04/26/2023)  Alcohol Screen: Low Risk  (12/06/2021)  Depression (PHQ2-9): Low Risk  (12/06/2021)  Financial Resource Strain: Low Risk  (12/06/2021)  Physical Activity: Inactive (12/06/2021)  Social Connections: Unknown (12/06/2021)  Stress: No Stress Concern Present (12/06/2021)  Tobacco Use: Medium Risk (04/24/2023)    Readmission Risk Interventions     No data to display

## 2023-05-06 ENCOUNTER — Other Ambulatory Visit (HOSPITAL_COMMUNITY): Payer: Self-pay

## 2023-05-06 DIAGNOSIS — Z7189 Other specified counseling: Secondary | ICD-10-CM | POA: Diagnosis not present

## 2023-05-06 DIAGNOSIS — R579 Shock, unspecified: Secondary | ICD-10-CM | POA: Diagnosis not present

## 2023-05-06 DIAGNOSIS — Z515 Encounter for palliative care: Secondary | ICD-10-CM | POA: Diagnosis not present

## 2023-05-06 LAB — BASIC METABOLIC PANEL
Anion gap: 8 (ref 5–15)
BUN: 18 mg/dL (ref 8–23)
CO2: 29 mmol/L (ref 22–32)
Calcium: 8.1 mg/dL — ABNORMAL LOW (ref 8.9–10.3)
Chloride: 101 mmol/L (ref 98–111)
Creatinine, Ser: 0.46 mg/dL (ref 0.44–1.00)
GFR, Estimated: >60 mL/min (ref >60)
Glucose, Bld: 98 mg/dL (ref 70–99)
Potassium: 3.1 mmol/L — ABNORMAL LOW (ref 3.5–5.1)
Sodium: 138 mmol/L (ref 135–145)

## 2023-05-06 LAB — CBC
HCT: 27.1 % — ABNORMAL LOW (ref 36.0–46.0)
Hemoglobin: 8.1 g/dL — ABNORMAL LOW (ref 12.0–15.0)
MCH: 31.5 pg (ref 26.0–34.0)
MCHC: 29.9 g/dL — ABNORMAL LOW (ref 30.0–36.0)
MCV: 105.4 fL — ABNORMAL HIGH (ref 80.0–100.0)
Platelets: 339 10*3/uL (ref 150–400)
RBC: 2.57 MIL/uL — ABNORMAL LOW (ref 3.87–5.11)
RDW: 19.3 % — ABNORMAL HIGH (ref 11.5–15.5)
WBC: 11.2 10*3/uL — ABNORMAL HIGH (ref 4.0–10.5)
nRBC: 0 % (ref 0.0–0.2)

## 2023-05-06 LAB — MAGNESIUM: Magnesium: 1.7 mg/dL (ref 1.7–2.4)

## 2023-05-06 MED ORDER — APIXABAN (ELIQUIS) VTE STARTER PACK (10MG AND 5MG)
ORAL_TABLET | ORAL | 0 refills | Status: DC
  Filled 2023-05-06: qty 74, 30d supply, fill #0

## 2023-05-06 MED ORDER — TRAMADOL HCL 50 MG PO TABS
50.0000 mg | ORAL_TABLET | Freq: Two times a day (BID) | ORAL | 0 refills | Status: DC | PRN
  Filled 2023-05-06: qty 14, 7d supply, fill #0

## 2023-05-06 MED ORDER — POLYETHYLENE GLYCOL 3350 17 GM/SCOOP PO POWD
17.0000 g | Freq: Every day | ORAL | 0 refills | Status: DC
  Filled 2023-05-06: qty 238, 14d supply, fill #0

## 2023-05-06 MED ORDER — ENSURE ENLIVE PO LIQD: 237.0000 mL | Freq: Two times a day (BID) | ORAL | Status: AC

## 2023-05-06 MED ORDER — POTASSIUM CHLORIDE CRYS ER 20 MEQ PO TBCR
40.0000 meq | EXTENDED_RELEASE_TABLET | Freq: Two times a day (BID) | ORAL | Status: AC
  Administered 2023-05-06: 40 meq via ORAL
  Filled 2023-05-06 (×3): qty 2

## 2023-05-06 MED ORDER — FOLIC ACID 1 MG PO TABS
1.0000 mg | ORAL_TABLET | Freq: Every day | ORAL | 0 refills | Status: AC
  Filled 2023-05-06: qty 30, 30d supply, fill #0

## 2023-05-06 MED ORDER — HYDROCODONE-ACETAMINOPHEN 5-325 MG PO TABS
1.0000 | ORAL_TABLET | Freq: Four times a day (QID) | ORAL | Status: DC | PRN
  Administered 2023-05-08 – 2023-05-11 (×2): 1 via ORAL
  Filled 2023-05-06 (×2): qty 1

## 2023-05-06 MED ORDER — POTASSIUM CHLORIDE CRYS ER 20 MEQ PO TBCR
40.0000 meq | EXTENDED_RELEASE_TABLET | Freq: Two times a day (BID) | ORAL | Status: AC
  Administered 2023-05-07: 40 meq via ORAL
  Filled 2023-05-06: qty 2

## 2023-05-06 MED ORDER — MEDIHONEY WOUND/BURN DRESSING EX PSTE
1.0000 | PASTE | Freq: Every day | CUTANEOUS | 0 refills | Status: DC
  Filled 2023-05-06: qty 30, 30d supply, fill #0

## 2023-05-06 NOTE — Progress Notes (Signed)
 Pt son and "daughter in law" at bedside. Pt's son was upset that pt was sitting up in bed feeding herself. RN reassured pt's son that pt was being monitored by tech and RN assigned. RN let pt's son know that pt had made progress since pt was transferred to 5W unit to feed herself with close monitoring. Pt's son insisted on speaking to nurse manager due to the son felt like there were gaps in communication from ICU regarding what was required to care for his mom at home. Pt stated he was not aware that his mom's pressure sores were to the extent of what he saw during RN's bedside care when pt had a BM while he was present. RN explained what type of care was provided during dressing changes and repositioning pt every two hours. Pt's "daughter in law" stated that pt's son did not know the extent of his mom's condition. RN provided reassurance. Charge nurse came to pt's bedside to provide further reassurance. RN notified pt per SW that the DME was attempting to contact pt's son to delivery the hospital bed that was required for pt's discharge. RN provided [employer]'s contact number for pt's son to return the call. Pt's son told staff that he was going to file a complaint with [facility]. Pt's son and "daughter in law" left without incident. RN will continue to monitor.

## 2023-05-06 NOTE — TOC Progression Note (Addendum)
 Transition of Care [facility]) - Progression Note    Patient Details  Name: Patricia Davies MRN: [id number] Date of Birth: 1946-03-04  Transition of [facility]) CM/SW Contact  Larrie Kass, LCSW Phone Number: 05/06/2023, 9:35 AM  Clinical Narrative:    CSW spoke with the pt's son, Chrissie Noa. He stated that he is not able to get in contact with his sister. He mentioned issues with his sister and would like the pt to go to rehab. CSW explained that the pt would need PT recommendations for SNF placement due to the requirement for insurance authorization. Pt's son reported that someone told him she would go to rehab and then discharge home. CSW informed the pt's son that, per chart review, the pt was to be discharged home with her daughter and home health services. Pts son inquired about LTC placement. He stated that the pt is not able to privately pay and does not have Medicaid in place. CSW explained that the pt cannot be placed through the hospital without a payor source. P's son stated he will try to call his sister and call CSW back.  9:15 AM CSW received a call from the pt's son, stating that he is still not able to get in contact with his sister. He mentioned that he will take his mother to his home. Pt's son is requesting assistance with LTC placement in the community. CSW sent a referral to [employer]. [employer] was contacted to have a hospital bed delivered to the pt's son's home. TOC to follow  3:40pm CSW spoke with the pt's son, who reported his frustration with the hospital staff. He stated that when the pt was in the ICU, he was told the pt would go to rehab. He mentioned that he feels like the hospital is trying to rush his mother out the door. He also stated that he needs to make sure the pt has everything needed before she can come to his home. CSW inquired about what the pt's son might need. Pt's son responded, 'Why don't you and the hospital staff get together and tell me  what I need?' CSW explained the hospital's recommendation is for home health services , which have already been arranged. The pt's son passed the phone to the pt's daughter-in-law. Pt's daughter-in-law requested that the pt be discharged on Friday so they would have time to make space for the hospital bed. She also requested a wheelchair. Pt will need DME orders placed, and the MD has been made aware. A referral for the wheelchair was sent to [employer]. TOC to follow."   Expected Discharge Plan: Home w Home Health Services Barriers to Discharge: ED DME delivery  Expected Discharge Plan and Services   Discharge Planning Services: CM Consult   Living arrangements for the past 2 months: Single Family Home                 DME Arranged: Hospital bed DME Agency: [employer] Date DME Agency Contacted: 05/01/23 Time DME Agency Contacted: [phone] Representative spoke with at DME Agency: Vaughan Basta HH Arranged: PT HH Agency: [facility] Date [facility] Agency Contacted: 05/01/23 Time HH Agency Contacted: [phone] Representative spoke with at [facility] Agency: Amy   Social Determinants of Health (SDOH) Interventions SDOH Screenings   Food Insecurity: No Food Insecurity (04/26/2023)  Housing: Low Risk  (04/26/2023)  Transportation Needs: No Transportation Needs (04/26/2023)  Utilities: Not At Risk (04/26/2023)  Alcohol Screen: Low Risk  (12/06/2021)  Depression (PHQ2-9): Low  Risk  (12/06/2021)  Financial Resource Strain: Low Risk  (12/06/2021)  Physical Activity: Inactive (12/06/2021)  Social Connections: Unknown (12/06/2021)  Stress: No Stress Concern Present (12/06/2021)  Tobacco Use: Medium Risk (04/24/2023)    Readmission Risk Interventions     No data to display

## 2023-05-06 NOTE — Plan of Care (Signed)
  Problem: Health Behavior/Discharge Planning: Goal: Ability to manage health-related needs will improve Outcome: Progressing   Problem: Clinical Measurements: Goal: Ability to maintain clinical measurements within normal limits will improve Outcome: Progressing Goal: Will remain free from infection Outcome: Progressing Goal: Diagnostic test results will improve Outcome: Progressing Goal: Respiratory complications will improve Outcome: Progressing Goal: Cardiovascular complication will be avoided Outcome: Progressing   Problem: Activity: Goal: Risk for activity intolerance will decrease Outcome: Progressing   Problem: Nutrition: Goal: Adequate nutrition will be maintained Outcome: Progressing   Problem: Coping: Goal: Level of anxiety will decrease Outcome: Progressing   Problem: Elimination: Goal: Will not experience complications related to bowel motility Outcome: Progressing Goal: Will not experience complications related to urinary retention Outcome: Progressing   Problem: Pain Managment: Goal: General experience of comfort will improve and/or be controlled Outcome: Progressing   Problem: Safety: Goal: Ability to remain free from injury will improve Outcome: Progressing   Problem: Skin Integrity: Goal: Risk for impaired skin integrity will decrease Outcome: Progressing   Problem: Fluid Volume: Goal: Hemodynamic stability will improve Outcome: Progressing   Problem: Clinical Measurements: Goal: Diagnostic test results will improve Outcome: Progressing Goal: Signs and symptoms of infection will decrease Outcome: Progressing   Problem: Respiratory: Goal: Ability to maintain adequate ventilation will improve Outcome: Progressing   Problem: Activity: Goal: Ability to tolerate increased activity will improve Outcome: Progressing   Problem: Clinical Measurements: Goal: Ability to maintain a body temperature in the normal range will improve Outcome:  Progressing   Problem: Respiratory: Goal: Ability to maintain adequate ventilation will improve Outcome: Progressing Goal: Ability to maintain a clear airway will improve Outcome: Progressing

## 2023-05-06 NOTE — Progress Notes (Signed)
 Physical Therapy Treatment Patient Details Name: Patricia Davies MRN: [id number] DOB: 12-15-1945 Today's Date: 05/06/2023   History of Present Illness 78 year old  female  with Acute metabolic encephalopathy likely secondary to multifactorial issues in the setting of severe dementia, pneumonia, shock, dehydration. pt also with multiple pressure injuries on admission.  PMH: sarcoidosis COPD hypertension, kidney stones B12 deficiency and dementia.    PT Comments  Pt pleasant and awake when PT arrives. Pt states that she is not in any current disocmfort or pain, agreeable to attempt mobility progression as tolerated. Pt comes to sit EOB, is able to maintain balance without weight shifting, requires HHA for COM challenges. Pt attempts to stand, tasks broken into part and whole for improved comprehension, but unable to attain standing position without total A.  At this time, she would require equipment to facilitate this level of mobility at home. Pt was found to have had BM, nursing notified and pt returned to bed with nursing staff present. Pt stands to benefit from skilled PT <3 hours per day at dc.     If plan is discharge home, recommend the following: A lot of help with bathing/dressing/bathroom;A lot of help with walking and/or transfers;Assistance with cooking/housework;Assist for transportation;Help with stairs or ramp for entrance;Direct supervision/assist for medications management   Can travel by private vehicle     No  Equipment Recommendations  Hoyer lift    Recommendations for Other Services       Precautions / Restrictions Precautions Precautions: Fall Precaution/Restrictions Comments: pressure ulcers, painful L foot /ankle, painful RUE( DVT) Restrictions Weight Bearing Restrictions Per Provider Order: No     Mobility  Bed Mobility Overal bed mobility: Needs Assistance Bed Mobility: Supine to Sit, Sit to Supine     Supine to sit: Mod assist Sit to supine: Max assist    General bed mobility comments: LE assist, cues for hand placement, trunk assist    Transfers Overall transfer level: Needs assistance   Transfers:  (unable to stand)             General transfer comment: Pt likely completes bed<>chair transfers without AD (pt denies use of AD but confirms help from others)    Ambulation/Gait                   Stairs             Wheelchair Mobility     Tilt Bed    Modified Rankin (Stroke Patients Only)       Balance Overall balance assessment: Needs assistance Sitting-balance support: No upper extremity supported, Feet supported Sitting balance-Leahy Scale: Fair Sitting balance - Comments: unable to scoot or tolerate weight shift but maintains static balance EOB with feet supported                                    Communication Communication Factors Affecting Communication: Difficulty expressing self  Cognition Arousal: Alert Behavior During Therapy: WFL for tasks assessed/performed   PT - Cognitive impairments: History of cognitive impairments, No family/caregiver present to determine baseline                                Cueing Cueing Techniques: Verbal cues, Gestural cues, Tactile cues  Exercises      General Comments General comments (skin integrity, edema, etc.): pt unable to stand, pt found  to be soiled, nursing notified, pt returned to bed supine and nursing staff present to clean pt      Pertinent Vitals/Pain Pain Assessment Pain Assessment: No/denies pain Pain Intervention(s): Monitored during session    Home Living                          Prior Function            PT Goals (current goals can now be found in the care plan section) Acute Rehab PT Goals PT Goal Formulation: With patient (get to the chair from bed) Time For Goal Achievement: 05/20/23 Potential to Achieve Goals: Fair Progress towards PT goals: Progressing toward goals     Frequency    Min 1X/week      PT Plan      Co-evaluation              AM-PAC PT "6 Clicks" Mobility   Outcome Measure  Help needed turning from your back to your side while in a flat bed without using bedrails?: A Lot Help needed moving from lying on your back to sitting on the side of a flat bed without using bedrails?: A Lot Help needed moving to and from a bed to a chair (including a wheelchair)?: Total Help needed standing up from a chair using your arms (e.g., wheelchair or bedside chair)?: Total Help needed to walk in hospital room?: Total Help needed climbing 3-5 steps with a railing? : Total 6 Click Score: 8    End of Session Equipment Utilized During Treatment: Gait belt Activity Tolerance: Patient tolerated treatment well Patient left: in bed;with call bell/phone within reach;with nursing/sitter in room Nurse Communication: Mobility status PT Visit Diagnosis: Other abnormalities of gait and mobility (R26.89)     Time: 8413-2440 PT Time Calculation (min) (ACUTE ONLY): 31 min  Charges:    $Therapeutic Activity: 23-37 mins PT General Charges $$ ACUTE PT VISIT: 1 Visit                     Madaline Guthrie PT Acute Rehabilitation Services Office: [phone] 05/06/2023\   Evelena Peat 05/06/2023, 5:03 PM

## 2023-05-06 NOTE — Progress Notes (Signed)
  Daily Progress Note   Patient Name: Patricia Davies       Date: 05/06/2023 DOB: [birth year]  Age: 78 y.o. MRN#: [id number] Attending Physician: Briant Cedar, MD Primary Care Physician: [name], MD Admit Date: 04/24/2023 Length of Stay: 12 days  Reason for Consultation/Follow-up: Establishing goals of care  Subjective:   CC: Patient denies any complaints at this time.  Awake alert, smiling, sitting up in bed. Following up regarding complex medical decision making  Subjective:  Reviewed EMR prior to presenting to bedside.  TOC note reviewed.    Objective:   Vital Signs:  BP 131/73 (BP Location: Left Arm)   Pulse 100   Temp 98.7 F (37.1 C) (Oral)   Resp 16   Ht 5' (1.524 m)   Wt 44 kg   SpO2 98%   BMI 18.94 kg/m   Physical Exam: General:   awake alert, interactive though confused, chronically ill-appearing, cachectic, frail HENT: Temporal wasting present bilaterally Cardiovascular: RRR Respiratory: no increased work of breathing noted, not in respiratory distress Extremities: Muscle wasting present in all extremities Neuro: Confused at times though pleasant  Imaging:  I personally reviewed recent imaging.   Assessment & Plan:   Assessment: Patient is a 78 year old female with a past medical history of dementia, sarcoidosis, hypertension, kidney stones, and B12 deficiency who was living at home with her daughter and admitted on 04/24/2023 for management of poor appetite and weight loss.  As noted by daughter, patient has been progressively declining for the past 3 years.  Upon admission, patient received management for acute on chronic anemia, AKI, electrolyte abnormalities, and sepsis in the second of pneumonia.  Palliative medicine team consulted to assist with complex medical decision making.  Recommendations/Plan: # Complex medical decision making/goals of care:  - Patient unable to engage in complex medical decision making due to medical status. TOC note  reviewed, plan is now for the patient to go home with son. Recommend outpatient palliative care, no further inpatient PMT specific recommendations at this time.       -  Code Status: Limited: Do not attempt resuscitation (DNR) -DNR-LIMITED -Do Not Intubate/DNI   # Psychosocial Support:  -Son, daughter   -No ACP documentation on file.  Son noted patient had not completed HCPOA documentation.  # Discharge Planning: home with home health and outpatient palliative.  -TOC assisting with discharge plan  Discussed with: IDT  Thank you for allowing the palliative care team to participate in the care Elmon Else.  Low MDM Rosalin Hawking MD.  Palliative Care Provider PMT # [phone]  If patient remains symptomatic despite maximum doses, please call PMT at [phone] between 0700 and 1900. Outside of these hours, please call attending, as PMT does not have night coverage.

## 2023-05-06 NOTE — Progress Notes (Addendum)
 PROGRESS NOTE    Patricia Davies  ZOX:[id number] DOB: 09-May-1945 DOA: 04/24/2023 PCP: [name], MD   Brief Narrative:  TRH pickup 04/25/2023 Admitted by PCCM on 04/24/2023 78 year old  female lives at home with her daughter for the last 3 years due to advanced dementia.  She has a past medical history significant for sarcoidosis COPD hypertension, kidney stones B12 deficiency and dementia.  She has been struggling with dementia for the past 7 to 8 years.  Patient had gradually progressive decline in the past 3 years. Patient has very poor appetite with significant weight loss. On arrival to the emergency room she was obtunded hypotensive. Labs on admission showed sodium was 149 BUN was 59 creatinine 1.72, albumin 2.1, lactic acid 6.5, hemoglobin 7.9, platelets 132. COVID RSV and flu were negative. Chest x-ray -  Increased left apical opacity. CT head showed cerebral atrophy and no evidence of acute intracranial abnormality.  Patient admitted for further management.     Today, patient appeared comfortable.  Multiple attempts was made on call patient's children without any luck.  Plan for patient to discharge to son's home on 05/09/23.  Mild leukocytosis noted, likely reactive.  Repeat CBC in a.m.    Assessment & Plan:   Principal Problem:   Shock (HCC) Active Problems:   Severe dementia without behavioral disturbance, psychotic disturbance, mood disturbance, or anxiety (HCC)   Hypovolemia   Failure to thrive in adult   Need for emotional support   Counseling and coordination of care   Goals of care, counseling/discussion   Palliative care encounter   Acute metabolic encephalopathy Likely secondary to multifactorial issues in the setting of severe dementia, pneumonia, shock, dehydration Mental status improving and at baseline  Severe sepsis likely 2/2 PNA Resolved She presented obtunded initial blood pressure with a EMS was 50/60 responded to IV fluids  She was hypotensive,  tachycardic, tachypneic and hypoxic with lactic acidosis in the ER. Completed 7 days of Unasyn on 05/01/23 MRSA PCR was negative Repeat Chest xray 2/28 -Continued left upper lobe opacity is noted concerning for pneumonia, scarring or possibly underlying malignancy  Acute DVT in the right femoral vein Noted bilateral lower extremity edema BLE Doppler showed acute DVT involving the right femoral vein, with age indeterminant DVT in the right common femoral vein, left lower extremity with no evidence of DVT Start Eliquis  Acute on chronic macrocytic anemia Hemoglobin dropped to 6.5 on 04/25/23, currently trending down Partly due to hemodilution from all the fluids that she has received so far, was positive over 8 L Transfused 1 unit of packed RBC on 04/25/2023 Folate level low Continue oral folate and B12 supplementation Daily CBC while on eliquis  AKI and hypernatremia  Resolved with IV fluids secondary to dehydration poor p.o. intake  Hypokalemia Replace as needed  Failure to thrive in the setting of advanced dementia Supportive care Hospice and palliative consulted, appreciate recs  Right upper extremity SVT Likely from IV infiltration  Doppler showed SVT Keep right upper extremity elevated as much as possible, pain management prn  Multiple pressure injuries present on admission  Per wound care-Sacrum wound is 100% tightly adhered eschar; Unstageable pressure injury; 5X6cm, Left hip is dark red-purple Deep tissue pressure injury; 6X5cm Right hip is Stage 3 pressure injury; 50% red, 30% yellow, 20% dark red-purple Deep tissue pressure injury to wound edges, 3.8X1.9cm Recommending-Apply Medihoney to sacrum and right hip wounds Q day then cover with foam dressing.  Change foam dressing Q 3 days  or PRN soiling Apply Xeroform gauze to left hip wound Q day and cover with foam dressing.  Change foam dressing Q 3 days or PRN soiling.   Pressure Injury 04/24/23 Hip Left Deep Tissue  Pressure Injury - Purple or maroon localized area of discolored intact skin or blood-filled blister due to damage of underlying soft tissue from pressure and/or shear. (Active)  04/24/23 1806  Location: Hip  Location Orientation: Left  Staging: Deep Tissue Pressure Injury - Purple or maroon localized area of discolored intact skin or blood-filled blister due to damage of underlying soft tissue from pressure and/or shear.  Wound Description (Comments):   Present on Admission: Yes  Dressing Type Foam - Lift dressing to assess site every shift 05/06/23 1100     Pressure Injury 04/24/23 Hip Right Stage 3 -  Full thickness tissue loss. Subcutaneous fat may be visible but bone, tendon or muscle are NOT exposed. (Active)  04/24/23 1813  Location: Hip  Location Orientation: Right  Staging: Stage 3 -  Full thickness tissue loss. Subcutaneous fat may be visible but bone, tendon or muscle are NOT exposed.  Wound Description (Comments):   Present on Admission: Yes  Dressing Type Foam - Lift dressing to assess site every shift 05/05/23 2045     Pressure Injury 04/24/23 Sacrum Unstageable - Full thickness tissue loss in which the base of the injury is covered by slough (yellow, tan, gray, green or brown) and/or eschar (tan, brown or black) in the wound bed. (Active)  04/24/23 1821  Location: Sacrum  Location Orientation:   Staging: Unstageable - Full thickness tissue loss in which the base of the injury is covered by slough (yellow, tan, gray, green or brown) and/or eschar (tan, brown or black) in the wound bed.  Wound Description (Comments):   Present on Admission: Yes  Dressing Type Foam - Lift dressing to assess site every shift 05/05/23 2045     Estimated body mass index is 18.94 kg/m as calculated from the following:   Height as of this encounter: 5' (1.524 m).   Weight as of this encounter: 44 kg.  DVT prophylaxis: scd Code Status: dnr Family Communication:dw son Disposition Plan:   Status is: Inpatient Remains inpatient appropriate because: medically ready for dc Consultants:  pccm  Procedures: none Antimicrobials:Completed Unasyn   Objective: Vitals:   05/05/23 2032 05/06/23 0453 05/06/23 0500 05/06/23 1256  BP: 113/60 131/73  (!) 146/80  Pulse:  100  86  Resp: 17 16  18   Temp: 98 F (36.7 C) 98.7 F (37.1 C)  97.8 F (36.6 C)  TempSrc:  Oral  Oral  SpO2:  98%  97%  Weight:   44 kg   Height:       No intake or output data in the 24 hours ending 05/06/23 1735  Filed Weights   05/03/23 0437 05/05/23 0500 05/06/23 0500  Weight: 44.5 kg 44 kg 44 kg    Examination: General: NAD Cardiovascular: S1, S2 present Respiratory: CTAB Abdomen: Soft, nontender, nondistended, bowel sounds present Musculoskeletal: No pedal edema noted Skin: Decubitus ulcer Psychiatry: Appears to be normal mood    Data Reviewed: I have personally reviewed following labs and imaging studies  CBC: Recent Labs  Lab 04/30/23 0308 05/01/23 0308 05/02/23 0257 05/05/23 0451 05/06/23 0457  WBC 7.5 6.8 6.2 9.2 11.2*  HGB 8.8* 8.4* 10.0* 7.9* 8.1*  HCT 27.5* 28.1* 32.5* 26.0* 27.1*  MCV 97.5 104.1* 101.6* 102.4* 105.4*  PLT 257 290 306 363 339  Basic Metabolic Panel: Recent Labs  Lab 04/30/23 0308 05/01/23 0308 05/02/23 0257 05/06/23 0457  NA 140 138 141 138  K 3.4* 3.9 3.8 3.1*  CL 109 109 109 101  CO2 25 24 27 29   GLUCOSE 100* 86 92 98  BUN 16 14 11 18   CREATININE <0.30* <0.30* 0.33* 0.46  CALCIUM 7.6* 7.5* 8.1* 8.1*  MG  --   --   --  1.7   GFR: Estimated Creatinine Clearance: 42.2 mL/min (by C-G formula based on SCr of 0.46 mg/dL). Liver Function Tests: Recent Labs  Lab 04/30/23 0308 05/02/23 0257  AST 25 24  ALT 29 23  ALKPHOS 43 41  BILITOT 0.7 1.0  PROT 4.9* 5.0*  ALBUMIN 2.1* 2.0*   No results for input(s): "LIPASE", "AMYLASE" in the last 168 hours. No results for input(s): "AMMONIA" in the last 168 hours.  Coagulation Profile: No  results for input(s): "INR", "PROTIME" in the last 168 hours.  Cardiac Enzymes: No results for input(s): "CKTOTAL", "CKMB", "CKMBINDEX", "TROPONINI" in the last 168 hours. BNP (last 3 results) No results for input(s): "PROBNP" in the last 8760 hours. HbA1C: No results for input(s): "HGBA1C" in the last 72 hours. CBG: No results for input(s): "GLUCAP" in the last 168 hours.  Lipid Profile: No results for input(s): "CHOL", "HDL", "LDLCALC", "TRIG", "CHOLHDL", "LDLDIRECT" in the last 72 hours. Thyroid Function Tests: No results for input(s): "TSH", "T4TOTAL", "FREET4", "T3FREE", "THYROIDAB" in the last 72 hours.  Anemia Panel: No results for input(s): "VITAMINB12", "FOLATE", "FERRITIN", "TIBC", "IRON", "RETICCTPCT" in the last 72 hours.  Sepsis Labs: No results for input(s): "PROCALCITON", "LATICACIDVEN" in the last 168 hours.   No results found for this or any previous visit (from the past 240 hours).    Radiology Studies: No results found.    Scheduled Meds:  apixaban  10 mg Oral Q12H   Followed by   Melene Muller ON 05/11/2023] apixaban  5 mg Oral Q12H   feeding supplement  237 mL Oral BID BM   folic acid  1 mg Oral Daily   leptospermum manuka honey  1 Application Topical QHS   memantine  10 mg Oral BID   mouth rinse  15 mL Mouth Rinse 4 times per day   potassium chloride  40 mEq Oral BID   Continuous Infusions:     LOS: 12 days   Briant Cedar, MD  05/06/2023, 5:35 PM

## 2023-05-07 ENCOUNTER — Other Ambulatory Visit (HOSPITAL_COMMUNITY): Payer: Self-pay

## 2023-05-07 DIAGNOSIS — R579 Shock, unspecified: Secondary | ICD-10-CM | POA: Diagnosis not present

## 2023-05-07 LAB — BASIC METABOLIC PANEL
Anion gap: 8 (ref 5–15)
BUN: 16 mg/dL (ref 8–23)
CO2: 30 mmol/L (ref 22–32)
Calcium: 8.4 mg/dL — ABNORMAL LOW (ref 8.9–10.3)
Chloride: 103 mmol/L (ref 98–111)
Creatinine, Ser: 0.41 mg/dL — ABNORMAL LOW (ref 0.44–1.00)
GFR, Estimated: >60 mL/min (ref >60)
Glucose, Bld: 90 mg/dL (ref 70–99)
Potassium: 3.7 mmol/L (ref 3.5–5.1)
Sodium: 141 mmol/L (ref 135–145)

## 2023-05-07 LAB — CBC
HCT: 30.1 % — ABNORMAL LOW (ref 36.0–46.0)
Hemoglobin: 9.1 g/dL — ABNORMAL LOW (ref 12.0–15.0)
MCH: 31.3 pg (ref 26.0–34.0)
MCHC: 30.2 g/dL (ref 30.0–36.0)
MCV: 103.4 fL — ABNORMAL HIGH (ref 80.0–100.0)
Platelets: 352 10*3/uL (ref 150–400)
RBC: 2.91 MIL/uL — ABNORMAL LOW (ref 3.87–5.11)
RDW: 19.1 % — ABNORMAL HIGH (ref 11.5–15.5)
WBC: 8.4 10*3/uL (ref 4.0–10.5)
nRBC: 0 % (ref 0.0–0.2)

## 2023-05-07 NOTE — Progress Notes (Addendum)
 Progress Note   Patient: Patricia Davies WGN:[id number] DOB: 1945-09-16 DOA: 04/24/2023     13 DOS: the patient was seen and examined on 05/07/2023   Brief hospital course: 78 year old  female lives at home with her daughter for the last 3 years due to advanced dementia.  She has a past medical history significant for sarcoidosis COPD hypertension, kidney stones B12 deficiency and dementia.  She has been struggling with dementia for the past 7 to 8 years.  Patient had gradually progressive decline in the past 3 years. Patient has very poor appetite with significant weight loss. On arrival to the emergency room she was obtunded hypotensive. Labs on admission showed sodium was 149 BUN was 59 creatinine 1.72, albumin 2.1, lactic acid 6.5, hemoglobin 7.9, platelets 132. COVID RSV and flu were negative. Chest x-ray -  Increased left apical opacity. CT head showed cerebral atrophy and no evidence of acute intracranial abnormality.  Patient admitted for further management.   Assessment and Plan:  Acute metabolic encephalopathy - Factorial etiology given initial sepsis and underlying dementia.  Appears to be resolved and patient back at her baseline.  Severe sepsis  - Initial systolic blood pressures in the 50s on presentation, hypotensive, tachycardic.  Completed 7 days of Unasyn 2/27.  Appears to be resolved.  Acute pneumonia - Chest x-ray 2/28 noting left upper lobe opacity.  Not currently hypoxic.  Completed antibiotic therapy.  Acute on chronic microcytic anemia - Status post 1 unit packed RBCs.  Hemoglobin appears to be stable.  No active bleeding.  Acute kidney injury - Resolved after IV fluid hydration.  Secondary to poor p.o. intake.  Continue to encourage p.o.  Advanced dementia with failure to thrive - Hospice and palliative care consulted, following closely.  Multiple pressure injuries present on admission -Per wound care-Sacrum wound is 100% tightly adhered eschar; Unstageable pressure  injury; 5X6cm, Left hip is dark red-purple Deep tissue pressure injury; 6X5cm Right hip is Stage 3 pressure injury; 50% red, 30% yellow, 20% dark red-purple Deep tissue pressure injury to wound edges, 3.8X1.9cm Recommending-Apply Medihoney to sacrum and right hip wounds Q day then cover with foam dressing.  Change foam dressing Q 3 days or PRN soiling Apply Xeroform gauze to left hip wound Q day and cover with foam dressing.  Change foam dressing Q 3 days or PRN soiling.  Right Lower extremity DVT - Doppler imaging 3/2 noting acute DVT.  Continues on anticoagulation.  Goals of care - Disposition complicated by family dynamic.  Please review case management notes for more details.  Initially dealing with a daughter who is since become MIA.  Discussion with patient's son, awaiting to clear space for home hospital bed.  Patient to be discharged home with home health.  Plan to discharge Friday 3/7.       Subjective: Patient resting comfortably.  Denies any shortness of breath, chest pain, nausea, vomiting, abdominal pain.  Physical Exam: Vitals:   05/06/23 1256 05/06/23 2032 05/07/23 0531 05/07/23 1222  BP: (!) 146/80 132/78 132/75 139/80  Pulse: 86 93 87 79  Resp: 18 20 18 17   Temp: 97.8 F (36.6 C) 98.9 F (37.2 C) 98.1 F (36.7 C) 97.8 F (36.6 C)  TempSrc: Oral Oral Oral Oral  SpO2: 97% 99% 98% 100%  Weight:      Height:       GENERAL:  Alert, pleasant, no acute distress, cachectic HEENT:  EOMI CARDIOVASCULAR:  RRR, no murmurs appreciated RESPIRATORY:  Clear to auscultation, no wheezing,  rales, or rhonchi GASTROINTESTINAL:  Soft, nontender, nondistended EXTREMITIES: Thin, no LE edema bilaterally NEURO:  No new focal deficits appreciated SKIN:  No rashes noted PSYCH:  Appropriate mood and affect   Data Reviewed:  There are no new results to review at this time.  Family Communication: None at bedside  Disposition: Status is: Inpatient Remains inpatient appropriate  because: Disposition planning  Planned Discharge Destination: Home with Home Health    Time spent: 35 minutes  Author: Deanna Artis, DO 05/07/2023 12:53 PM  For on call review [email or web address].

## 2023-05-07 NOTE — Plan of Care (Signed)
  Problem: Health Behavior/Discharge Planning: Goal: Ability to manage health-related needs will improve Outcome: Progressing   Problem: Clinical Measurements: Goal: Ability to maintain clinical measurements within normal limits will improve Outcome: Progressing Goal: Will remain free from infection Outcome: Progressing Goal: Diagnostic test results will improve Outcome: Progressing Goal: Respiratory complications will improve Outcome: Progressing Goal: Cardiovascular complication will be avoided Outcome: Progressing   Problem: Activity: Goal: Risk for activity intolerance will decrease Outcome: Progressing   Problem: Nutrition: Goal: Adequate nutrition will be maintained Outcome: Progressing   Problem: Coping: Goal: Level of anxiety will decrease Outcome: Progressing   Problem: Elimination: Goal: Will not experience complications related to bowel motility Outcome: Progressing Goal: Will not experience complications related to urinary retention Outcome: Progressing   Problem: Pain Managment: Goal: General experience of comfort will improve and/or be controlled Outcome: Progressing   Problem: Safety: Goal: Ability to remain free from injury will improve Outcome: Progressing   Problem: Skin Integrity: Goal: Risk for impaired skin integrity will decrease Outcome: Progressing   Problem: Fluid Volume: Goal: Hemodynamic stability will improve Outcome: Progressing   Problem: Clinical Measurements: Goal: Diagnostic test results will improve Outcome: Progressing Goal: Signs and symptoms of infection will decrease Outcome: Progressing   Problem: Respiratory: Goal: Ability to maintain adequate ventilation will improve Outcome: Progressing   Problem: Activity: Goal: Ability to tolerate increased activity will improve Outcome: Progressing   Problem: Clinical Measurements: Goal: Ability to maintain a body temperature in the normal range will improve Outcome:  Progressing   Problem: Respiratory: Goal: Ability to maintain adequate ventilation will improve Outcome: Progressing Goal: Ability to maintain a clear airway will improve Outcome: Progressing

## 2023-05-07 NOTE — Plan of Care (Signed)
   Problem: Elimination: Goal: Will not experience complications related to bowel motility Outcome: Progressing

## 2023-05-08 DIAGNOSIS — R579 Shock, unspecified: Secondary | ICD-10-CM | POA: Diagnosis not present

## 2023-05-08 LAB — CBC
HCT: 26.4 % — ABNORMAL LOW (ref 36.0–46.0)
Hemoglobin: 8.1 g/dL — ABNORMAL LOW (ref 12.0–15.0)
MCH: 32.3 pg (ref 26.0–34.0)
MCHC: 30.7 g/dL (ref 30.0–36.0)
MCV: 105.2 fL — ABNORMAL HIGH (ref 80.0–100.0)
Platelets: 301 10*3/uL (ref 150–400)
RBC: 2.51 MIL/uL — ABNORMAL LOW (ref 3.87–5.11)
RDW: 18.9 % — ABNORMAL HIGH (ref 11.5–15.5)
WBC: 7 10*3/uL (ref 4.0–10.5)
nRBC: 0 % (ref 0.0–0.2)

## 2023-05-08 LAB — MAGNESIUM: Magnesium: 1.8 mg/dL (ref 1.7–2.4)

## 2023-05-08 LAB — BASIC METABOLIC PANEL
Anion gap: 8 (ref 5–15)
BUN: 13 mg/dL (ref 8–23)
CO2: 27 mmol/L (ref 22–32)
Calcium: 8.4 mg/dL — ABNORMAL LOW (ref 8.9–10.3)
Chloride: 105 mmol/L (ref 98–111)
Creatinine, Ser: 0.37 mg/dL — ABNORMAL LOW (ref 0.44–1.00)
GFR, Estimated: >60 mL/min (ref >60)
Glucose, Bld: 93 mg/dL (ref 70–99)
Potassium: 3.5 mmol/L (ref 3.5–5.1)
Sodium: 140 mmol/L (ref 135–145)

## 2023-05-08 NOTE — Plan of Care (Signed)
  Problem: Health Behavior/Discharge Planning: Goal: Ability to manage health-related needs will improve Outcome: Progressing   Problem: Clinical Measurements: Goal: Ability to maintain clinical measurements within normal limits will improve Outcome: Progressing Goal: Will remain free from infection Outcome: Progressing Goal: Diagnostic test results will improve Outcome: Progressing Goal: Respiratory complications will improve Outcome: Progressing Goal: Cardiovascular complication will be avoided Outcome: Progressing   Problem: Activity: Goal: Risk for activity intolerance will decrease Outcome: Progressing   Problem: Nutrition: Goal: Adequate nutrition will be maintained Outcome: Progressing   Problem: Coping: Goal: Level of anxiety will decrease Outcome: Progressing   Problem: Elimination: Goal: Will not experience complications related to bowel motility Outcome: Progressing Goal: Will not experience complications related to urinary retention Outcome: Progressing   Problem: Pain Managment: Goal: General experience of comfort will improve and/or be controlled Outcome: Progressing   Problem: Safety: Goal: Ability to remain free from injury will improve Outcome: Progressing   Problem: Skin Integrity: Goal: Risk for impaired skin integrity will decrease Outcome: Progressing   Problem: Fluid Volume: Goal: Hemodynamic stability will improve Outcome: Progressing   Problem: Clinical Measurements: Goal: Diagnostic test results will improve Outcome: Progressing Goal: Signs and symptoms of infection will decrease Outcome: Progressing   Problem: Respiratory: Goal: Ability to maintain adequate ventilation will improve Outcome: Progressing   Problem: Activity: Goal: Ability to tolerate increased activity will improve Outcome: Progressing   Problem: Clinical Measurements: Goal: Ability to maintain a body temperature in the normal range will improve Outcome:  Progressing   Problem: Respiratory: Goal: Ability to maintain adequate ventilation will improve Outcome: Progressing Goal: Ability to maintain a clear airway will improve Outcome: Progressing

## 2023-05-08 NOTE — Progress Notes (Signed)
 Pt's son and daughter in law were at bedside and pt's son stated that he was told that his mother was unable to go to rehab due to her inability to "do anything for herself". Pt's son asked his mom to demonstrate to RN that she is able to follow commands and move her hands independently which reflected that she is motivated to "get better". Pt's son asked if pt could be reevaluated for rehab. RN asked pt's son when was the last time that he saw his mother walking independently and he stated that it was about November and that his sister stated that she wasn't able to bring her to any activities outside the home around that time due to pt not feeling well. Pt's son stated that he didn't know that she was in her present condition because when he visited his mom at this sister's house, she was always in the bed. RN forwarded pt's son's request to CW and MD to follow up to address pt's son's request. RN will continue to monitor.

## 2023-05-08 NOTE — TOC Progression Note (Signed)
 Transition of Care [facility]) - Progression Note    Patient Details  Name: Patricia Davies MRN: [id number] Date of Birth: [birth year]  Transition of Care [facility]) CM/SW Contact  Larrie Kass, LCSW Phone Number: 05/08/2023, 1:43 PM  Clinical Narrative:     CSW spoke with the pt's son, Daviana Haymaker, to inquire about DME delivery. The pt's son reported having trouble getting in touch with the DME company. He stated that he has called but has not received a return call. CSW confirmed with the DME company that the pt's son has not called to schedule the DME delivery. Pt's son is requesting more time to "clean and disinfect" the room where his mother will be living and is asking for the pt to stay until Monday. CSW explained that the pt has been medically stable since Monday, 3/3, and is ready for discharge. Pt's son started to expressed frustration about the situation, stating, "I'm going to get a lawyer." Pt's son kept repeating that he was told by several people when his mother was in the ICU that she would be going to short-term rehab before returning home. CSW explained that TOC had spoken with his sister several times and that the plan is for the pt to go home with home health services. CSW again discussed the process for LTC placement. Pt's son stated he is working on getting pt's SS card and will be going to DSS to start pt's Medicaid application soon. MD and RN were notified about the conversation with the pt's son.   3:00 PM: CSW received a call from a Financial controller. He stated that the pt's son is refusing to allow the DME company to deliver the DME until Monday of next week. [employer] was informed that the patient will not be in the home on Friday. TOC to follow.    Expected Discharge Plan: Home w Home Health Services Barriers to Discharge: ED DME delivery  Expected Discharge Plan and Services   Discharge Planning Services: CM Consult   Living arrangements for the past 2 months:  Single Family Home                 DME Arranged: Hospital bed DME Agency: [employer] Date DME Agency Contacted: 05/01/23 Time DME Agency Contacted: [phone] Representative spoke with at DME Agency: Vaughan Basta HH Arranged: PT HH Agency: [facility] Date [facility] Agency Contacted: 05/01/23 Time HH Agency Contacted: [phone] Representative spoke with at [facility] Agency: Amy   Social Determinants of Health (SDOH) Interventions SDOH Screenings   Food Insecurity: No Food Insecurity (04/26/2023)  Housing: Low Risk  (04/26/2023)  Transportation Needs: No Transportation Needs (04/26/2023)  Utilities: Not At Risk (04/26/2023)  Alcohol Screen: Low Risk  (12/06/2021)  Depression (PHQ2-9): Low Risk  (12/06/2021)  Financial Resource Strain: Low Risk  (12/06/2021)  Physical Activity: Inactive (12/06/2021)  Social Connections: Unknown (12/06/2021)  Stress: No Stress Concern Present (12/06/2021)  Tobacco Use: Medium Risk (04/24/2023)    Readmission Risk Interventions     No data to display

## 2023-05-08 NOTE — Plan of Care (Signed)
  Problem: Clinical Measurements: Goal: Ability to maintain clinical measurements within normal limits will improve Outcome: Progressing Goal: Will remain free from infection Outcome: Progressing Goal: Diagnostic test results will improve Outcome: Progressing Goal: Respiratory complications will improve Outcome: Progressing   Problem: Activity: Goal: Risk for activity intolerance will decrease Outcome: Progressing   Problem: Nutrition: Goal: Adequate nutrition will be maintained Outcome: Progressing   Problem: Coping: Goal: Level of anxiety will decrease Outcome: Progressing   Problem: Elimination: Goal: Will not experience complications related to bowel motility Outcome: Progressing   Problem: Pain Managment: Goal: General experience of comfort will improve and/or be controlled Outcome: Progressing   Problem: Safety: Goal: Ability to remain free from injury will improve Outcome: Progressing

## 2023-05-08 NOTE — Progress Notes (Signed)
 Progress Note   Patient: Patricia Davies ZOX:[id number] DOB: [birth year] DOA: 04/24/2023     14 DOS: the patient was seen and examined on 05/08/2023   Brief hospital course: 78 year old  female lives at home with her daughter for the last 3 years due to advanced dementia.  She has a past medical history significant for sarcoidosis COPD hypertension, kidney stones B12 deficiency and dementia.  She has been struggling with dementia for the past 7 to 8 years.  Patient had gradually progressive decline in the past 3 years. Patient has very poor appetite with significant weight loss. On arrival to the emergency room she was obtunded hypotensive. Labs on admission showed sodium was 149 BUN was 59 creatinine 1.72, albumin 2.1, lactic acid 6.5, hemoglobin 7.9, platelets 132. COVID RSV and flu were negative. Chest x-ray -  Increased left apical opacity. CT head showed cerebral atrophy and no evidence of acute intracranial abnormality.  Patient admitted for further management.   Assessment and Plan:  Acute metabolic encephalopathy - Factorial etiology given initial sepsis and underlying dementia.  Appears to be resolved and patient back at her baseline.  Severe sepsis  - Initial systolic blood pressures in the 50s on presentation, hypotensive, tachycardic.  Completed 7 days of Unasyn 2/27.  Appears to be resolved.  Acute pneumonia - Chest x-ray 2/28 noting left upper lobe opacity.  Not currently hypoxic.  Completed antibiotic therapy.  Acute on chronic microcytic anemia - Status post 1 unit packed RBCs.  Hemoglobin appears to be stable.  No active bleeding.  Acute kidney injury - Resolved after IV fluid hydration.  Secondary to poor p.o. intake.  Continue to encourage p.o.  Advanced dementia with failure to thrive - Hospice and palliative care consulted, following closely.  Multiple pressure injuries present on admission -Per wound care-Sacrum wound is 100% tightly adhered eschar; Unstageable pressure  injury; 5X6cm, Left hip is dark red-purple Deep tissue pressure injury; 6X5cm Right hip is Stage 3 pressure injury; 50% red, 30% yellow, 20% dark red-purple Deep tissue pressure injury to wound edges, 3.8X1.9cm Recommending-Apply Medihoney to sacrum and right hip wounds Q day then cover with foam dressing.  Change foam dressing Q 3 days or PRN soiling Apply Xeroform gauze to left hip wound Q day and cover with foam dressing.  Change foam dressing Q 3 days or PRN soiling.  Right Lower extremity DVT - Doppler imaging 3/2 noting acute DVT.  Continues on anticoagulation.  Goals of care - Disposition complicated by family dynamic.  Please review case management notes for more details.  Initially dealing with a daughter who is since become MIA.  Discussion with patient's son, awaiting to clear space for home hospital bed.  Patient to be discharged home with home health.  Plan to discharge Friday 3/7.  Working to provide family with only DME/equipment needs including hospital bed.       Subjective: Patient resting comfortably.  Denies any shortness of breath, chest pain, nausea, vomiting, abdominal pain.  Physical Exam: Vitals:   05/07/23 0531 05/07/23 1222 05/07/23 1928 05/08/23 0445  BP: 132/75 139/80 128/70 (!) 115/56  Pulse: 87 79 83 78  Resp: 18 17 18 18   Temp: 98.1 F (36.7 C) 97.8 F (36.6 C) 98 F (36.7 C) 98.1 F (36.7 C)  TempSrc: Oral Oral Oral Oral  SpO2: 98% 100% 100% 99%  Weight:      Height:       GENERAL:  Alert, pleasant, no acute distress, cachectic HEENT:  EOMI CARDIOVASCULAR:  RRR, no murmurs appreciated RESPIRATORY:  Clear to auscultation, no wheezing, rales, or rhonchi GASTROINTESTINAL:  Soft, nontender, nondistended EXTREMITIES: Thin, no LE edema bilaterally NEURO:  No new focal deficits appreciated SKIN:  No rashes noted PSYCH:  Appropriate mood and affect   Data Reviewed:  There are no new results to review at this time.  Family Communication: None at  bedside  Disposition: Status is: Inpatient Remains inpatient appropriate because: Disposition planning  Planned Discharge Destination: Home with Home Health    Time spent: 30 minutes  Author: Deanna Artis, DO 05/08/2023 12:09 PM  For on call review [email or web address].

## 2023-05-08 NOTE — Progress Notes (Signed)
 Pt's son was at bedside. Pt's son requested that pt to be discharged on Monday due to he was preparing the room where his mom would be staying in to make it more "sterile". RN forward message to MD and case worker to follow up with pt's son.

## 2023-05-09 ENCOUNTER — Other Ambulatory Visit (HOSPITAL_COMMUNITY): Payer: Self-pay

## 2023-05-09 DIAGNOSIS — R579 Shock, unspecified: Secondary | ICD-10-CM | POA: Diagnosis not present

## 2023-05-09 NOTE — Progress Notes (Signed)
 Progress Note   Patient: Patricia Davies QIO:[id number] DOB: 1945-05-10 DOA: 04/24/2023     15 DOS: the patient was seen and examined on 05/09/2023   Brief hospital course: 78 year old  female lives at home with her daughter for the last 3 years due to advanced dementia.  She has a past medical history significant for sarcoidosis COPD hypertension, kidney stones B12 deficiency and dementia.  She has been struggling with dementia for the past 7 to 8 years.  Patient had gradually progressive decline in the past 3 years. Patient has very poor appetite with significant weight loss. On arrival to the emergency room she was obtunded hypotensive. Labs on admission showed sodium was 149 BUN was 59 creatinine 1.72, albumin 2.1, lactic acid 6.5, hemoglobin 7.9, platelets 132. COVID RSV and flu were negative. Chest x-ray -  Increased left apical opacity. CT head showed cerebral atrophy and no evidence of acute intracranial abnormality.  Patient admitted for further management.   Assessment and Plan:  Acute metabolic encephalopathy - Factorial etiology given initial sepsis and underlying dementia.  Appears to be resolved and patient back at her baseline.  Severe sepsis  - Initial systolic blood pressures in the 50s on presentation, hypotensive, tachycardic.  Completed 7 days of Unasyn 2/27.  Appears to be resolved.  Acute pneumonia - Chest x-ray 2/28 noting left upper lobe opacity.  Not currently hypoxic.  Completed antibiotic therapy.  Acute on chronic microcytic anemia - Status post 1 unit packed RBCs.  Hemoglobin appears to be stable.  No active bleeding.  Acute kidney injury - Resolved after IV fluid hydration.  Secondary to poor p.o. intake.  Continue to encourage p.o.  Advanced dementia with failure to thrive - Hospice and palliative care consulted, following closely.  Multiple pressure injuries present on admission -Per wound care-Sacrum wound is 100% tightly adhered eschar; Unstageable pressure  injury; 5X6cm, Left hip is dark red-purple Deep tissue pressure injury; 6X5cm Right hip is Stage 3 pressure injury; 50% red, 30% yellow, 20% dark red-purple Deep tissue pressure injury to wound edges, 3.8X1.9cm Recommending-Apply Medihoney to sacrum and right hip wounds Q day then cover with foam dressing.  Change foam dressing Q 3 days or PRN soiling Apply Xeroform gauze to left hip wound Q day and cover with foam dressing.  Change foam dressing Q 3 days or PRN soiling.  Right Lower extremity DVT - Doppler imaging 3/2 noting acute DVT.  Continues on anticoagulation.  Goals of care - Disposition complicated by family dynamic.  Please review case management notes for more details.  Discussion with patient's son, awaiting to clear space for home hospital bed.  Patient to be discharged home with home health.  Plan to discharge now delayed to 3/10.  Son states he needs time to clean and prepare for the hospital bed.         Subjective: Patient resting comfortably.  Denies any shortness of breath, chest pain, nausea, vomiting, abdominal pain.  States she is feeling well today.  Physical Exam: Vitals:   05/08/23 0445 05/08/23 1429 05/08/23 2022 05/09/23 0446  BP: (!) 115/56 (!) 144/76 120/62 124/75  Pulse: 78 65 83 87  Resp: 18 18 18 19   Temp: 98.1 F (36.7 C) 97.9 F (36.6 C) 98.4 F (36.9 C) 98.4 F (36.9 C)  TempSrc: Oral Oral Oral Oral  SpO2: 99% 95% 100% 100%  Weight:      Height:       GENERAL:  Alert, pleasant, no acute distress, cachectic HEENT:  EOMI CARDIOVASCULAR:  RRR, no murmurs appreciated RESPIRATORY:  Clear to auscultation, no wheezing, rales, or rhonchi GASTROINTESTINAL:  Soft, nontender, nondistended EXTREMITIES: Thin, no LE edema bilaterally NEURO:  No new focal deficits appreciated SKIN:  No rashes noted PSYCH:  Appropriate mood and affect   Data Reviewed:  There are no new results to review at this time.  Family Communication: None at  bedside  Disposition: Status is: Inpatient Remains inpatient appropriate because: Disposition planning  Planned Discharge Destination: Home with Home Health    Time spent: 31 minutes  Author: Deanna Artis, DO 05/09/2023 10:54 AM  For on call review [email or web address].

## 2023-05-09 NOTE — Plan of Care (Signed)
  Problem: Health Behavior/Discharge Planning: Goal: Ability to manage health-related needs will improve Outcome: Progressing   Problem: Clinical Measurements: Goal: Ability to maintain clinical measurements within normal limits will improve Outcome: Progressing Goal: Will remain free from infection Outcome: Progressing Goal: Diagnostic test results will improve Outcome: Progressing Goal: Respiratory complications will improve Outcome: Progressing Goal: Cardiovascular complication will be avoided Outcome: Progressing   Problem: Activity: Goal: Risk for activity intolerance will decrease Outcome: Progressing   Problem: Nutrition: Goal: Adequate nutrition will be maintained Outcome: Progressing   Problem: Coping: Goal: Level of anxiety will decrease Outcome: Progressing   Problem: Elimination: Goal: Will not experience complications related to bowel motility Outcome: Progressing Goal: Will not experience complications related to urinary retention Outcome: Progressing   Problem: Pain Managment: Goal: General experience of comfort will improve and/or be controlled Outcome: Progressing   Problem: Safety: Goal: Ability to remain free from injury will improve Outcome: Progressing   Problem: Skin Integrity: Goal: Risk for impaired skin integrity will decrease Outcome: Progressing   Problem: Fluid Volume: Goal: Hemodynamic stability will improve Outcome: Progressing   Problem: Clinical Measurements: Goal: Diagnostic test results will improve Outcome: Progressing Goal: Signs and symptoms of infection will decrease Outcome: Progressing   Problem: Respiratory: Goal: Ability to maintain adequate ventilation will improve Outcome: Progressing   Problem: Activity: Goal: Ability to tolerate increased activity will improve Outcome: Progressing   Problem: Clinical Measurements: Goal: Ability to maintain a body temperature in the normal range will improve Outcome:  Progressing   Problem: Respiratory: Goal: Ability to maintain adequate ventilation will improve Outcome: Progressing Goal: Ability to maintain a clear airway will improve Outcome: Progressing

## 2023-05-10 DIAGNOSIS — R579 Shock, unspecified: Secondary | ICD-10-CM | POA: Diagnosis not present

## 2023-05-10 NOTE — Progress Notes (Signed)
 Pt drank all of her vanilla ensure stating she does not like chocolate but she also ate 100% of her magic ice cream cup which was chocolate.

## 2023-05-10 NOTE — Progress Notes (Signed)
 Progress Note   Patient: Patricia Davies [id number]:[id number] DOB: September 19, 1945 DOA: 04/24/2023     16 DOS: the patient was seen and examined on 05/10/2023   Brief hospital course: 78 year old  female lives at home with her daughter for the last 3 years due to advanced dementia.  She has a past medical history significant for sarcoidosis COPD hypertension, kidney stones B12 deficiency and dementia.  She has been struggling with dementia for the past 7 to 8 years.  Patient had gradually progressive decline in the past 3 years. Patient has very poor appetite with significant weight loss. On arrival to the emergency room she was obtunded hypotensive.  Treated for pneumonia.  At this time medically stable for discharge however disposition complicated by family dynamic.  Assessment and Plan:  Acute metabolic encephalopathy - Factorial etiology given initial sepsis and underlying dementia.  Appears to be resolved and patient back at her baseline.  Severe sepsis  - Initial systolic blood pressures in the 50s on presentation, hypotensive, tachycardic.  Completed 7 days of Unasyn 2/27.  Appears to be resolved.  Acute pneumonia - Chest x-ray 2/28 noting left upper lobe opacity.  Not currently hypoxic.  Completed antibiotic therapy.  Acute on chronic microcytic anemia - Status post 1 unit packed RBCs.  Hemoglobin appears to be stable.  No active bleeding.  Acute kidney injury - Resolved after IV fluid hydration.  Secondary to poor p.o. intake.  Continue to encourage p.o.  Advanced dementia with failure to thrive - Hospice and palliative care consulted, following closely.  Multiple pressure injuries present on admission -Per wound care-Sacrum wound is 100% tightly adhered eschar; Unstageable pressure injury; 5X6cm, Left hip is dark red-purple Deep tissue pressure injury; 6X5cm Right hip is Stage 3 pressure injury; 50% red, 30% yellow, 20% dark red-purple Deep tissue pressure injury to wound edges,  3.8X1.9cm Recommending-Apply Medihoney to sacrum and right hip wounds Q day then cover with foam dressing.  Change foam dressing Q 3 days or PRN soiling Apply Xeroform gauze to left hip wound Q day and cover with foam dressing.  Change foam dressing Q 3 days or PRN soiling.  Right Lower extremity DVT - Doppler imaging 3/2 noting acute DVT.  Continues on anticoagulation.  Goals of care - Disposition complicated by family dynamic.  Please review case management notes for more details.  Discussion with patient's son, awaiting to clear space for home hospital bed.  Patient to be discharged home with home health.  Plan to discharge now delayed to 3/10.  Son states he needs time to clean and prepare for the hospital bed.         Subjective: Patient resting comfortably.  Denies any shortness of breath, chest pain, nausea, vomiting, abdominal pain.  States she has nothing to complain about today.  Physical Exam: Vitals:   05/09/23 0446 05/09/23 1350 05/09/23 1935 05/10/23 0523  BP: 124/75 132/70 110/62 135/76  Pulse: 87 94 89 73  Resp: 19 18 16 17   Temp: 98.4 F (36.9 C) 98.5 F (36.9 C) 98.9 F (37.2 C) 97.6 F (36.4 C)  TempSrc: Oral   Oral  SpO2: 100% 98% 100% 98%  Weight:      Height:       GENERAL:  Alert, pleasant, no acute distress, cachectic HEENT:  EOMI CARDIOVASCULAR:  RRR, no murmurs appreciated RESPIRATORY:  Clear to auscultation, no wheezing, rales, or rhonchi GASTROINTESTINAL:  Soft, nontender, nondistended EXTREMITIES: Thin, no LE edema bilaterally NEURO:  No new focal deficits appreciated  SKIN:  No rashes noted PSYCH:  Appropriate mood and affect   Data Reviewed:  There are no new results to review at this time.  Family Communication: None at bedside  Disposition: Status is: Inpatient Remains inpatient appropriate because: Disposition planning  Planned Discharge Destination: Home with Home Health    Time spent: 25 minutes  Author: Deanna Artis,  DO 05/10/2023 12:31 PM  For on call review [email or web address].

## 2023-05-10 NOTE — Progress Notes (Signed)
 Physical Therapy Treatment Patient Details Name: INNOCENCE SCHLOTZHAUER MRN: [id number] DOB: Jan 16, 1946 Today's Date: 05/10/2023   History of Present Illness 78 year old  female  with Acute metabolic encephalopathy likely secondary to multifactorial issues in the setting of severe dementia, pneumonia, shock, dehydration. pt also with multiple pressure injuries on admission.  PMH: sarcoidosis COPD hypertension, kidney stones B12 deficiency and dementia.    PT Comments  Pt agreeable to therapy, smiling, reports being at home and appears surprised when therapist orients pt to being in hospital. Pt reports being 78 years old and unsure of the year or current president. Pt needing increased time with 1 step commands, redirection to task and multimodal cues to complete. Pt needing mod A to come to sitting EOB, slowly inches BLE towards EOB, using HOB elevated and bedrails. Once seated EOB, pt unable to tolerate weight shifting, min A to close supv with static supported sitting. With fatigue, pt needing max A to lift BLE back into bed and reposition to comfort. Mod A to roll R/L to reposition linen under pt, multimodal cues for using bedrail to assist in rotating trunk. Pt needing assist with LLE movement, appears fatigued with AAROM and declines RLE mobility due to pain with mobility. Pt in supine, RN in room to assist with feeding.    If plan is discharge home, recommend the following: A lot of help with bathing/dressing/bathroom;A lot of help with walking and/or transfers;Assistance with cooking/housework;Assist for transportation;Help with stairs or ramp for entrance;Direct supervision/assist for medications management   Can travel by private vehicle     No  Equipment Recommendations  Hoyer lift    Recommendations for Other Services       Precautions / Restrictions Precautions Precautions: Fall Precaution/Restrictions Comments: pressure ulcers, painful RUE( DVT) Restrictions Weight Bearing Restrictions  Per Provider Order: No     Mobility  Bed Mobility Overal bed mobility: Needs Assistance Bed Mobility: Supine to Sit, Rolling, Sit to Supine Rolling: Mod assist, Used rails   Supine to sit: Mod assist, HOB elevated, Used rails Sit to supine: Max assist   General bed mobility comments: pt inching BLE towards EOB, heavy cues and reminders to task at hand to complete transfer to EOB and avoid returning to supine; max A to lift BLE back into bed and reposition trunk to comfort; mod A rolling R/L to reposition linen, cues for bedrail use and pt able to reach for bedrails to assist in rotating upper trunk    Transfers                   General transfer comment: pt declines, fatigues with sitting EOB and requests to return to supine, VSS on RA    Ambulation/Gait                   Stairs             Wheelchair Mobility     Tilt Bed    Modified Rankin (Stroke Patients Only)       Balance Overall balance assessment: Needs assistance Sitting-balance support: No upper extremity supported, Feet supported Sitting balance-Leahy Scale: Fair Sitting balance - Comments: static sitting min A to close supv, unable to weightshift or reach                                    Communication Communication Communication: No apparent difficulties  Cognition Arousal: Alert Behavior During Therapy:  WFL for tasks assessed/performed   PT - Cognitive impairments: History of cognitive impairments, No family/caregiver present to determine baseline                       PT - Cognition Comments: pt able to report name correctly, reports being at home and being 29 years ago, also states "no clue" when asked about current year or president        Cueing Cueing Techniques: Verbal cues, Gestural cues, Tactile cues  Exercises General Exercises - Lower Extremity Ankle Circles/Pumps: AAROM, Left, 10 reps, Supine Short Arc Quad: AAROM, Left, 5 reps,  Supine Heel Slides: AAROM, Left, 5 reps, Supine    General Comments General comments (skin integrity, edema, etc.): VSS on RA      Pertinent Vitals/Pain Pain Assessment Pain Assessment: Faces Faces Pain Scale: Hurts a little bit Pain Location: generalized with movement Pain Descriptors / Indicators: Grimacing Pain Intervention(s): Limited activity within patient's tolerance, Monitored during session, Repositioned    Home Living                          Prior Function            PT Goals (current goals can now be found in the care plan section) Acute Rehab PT Goals PT Goal Formulation: With patient (get to the chair from bed) Time For Goal Achievement: 05/20/23 Potential to Achieve Goals: Fair Progress towards PT goals: Progressing toward goals    Frequency    Min 1X/week      PT Plan      Co-evaluation              AM-PAC PT "6 Clicks" Mobility   Outcome Measure  Help needed turning from your back to your side while in a flat bed without using bedrails?: A Lot Help needed moving from lying on your back to sitting on the side of a flat bed without using bedrails?: A Lot Help needed moving to and from a bed to a chair (including a wheelchair)?: Total Help needed standing up from a chair using your arms (e.g., wheelchair or bedside chair)?: Total Help needed to walk in hospital room?: Total Help needed climbing 3-5 steps with a railing? : Total 6 Click Score: 8    End of Session   Activity Tolerance: Patient tolerated treatment well Patient left: in bed;with call bell/phone within reach Nurse Communication: Mobility status PT Visit Diagnosis: Other abnormalities of gait and mobility (R26.89)     Time: 6045-4098 PT Time Calculation (min) (ACUTE ONLY): 32 min  Charges:    $Therapeutic Exercise: 8-22 mins $Therapeutic Activity: 8-22 mins PT General Charges $$ ACUTE PT VISIT: 1 Visit                     Tori Mitchelle Goerner PT, DPT 05/10/23,  12:16 PM
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
## 2023-05-10 NOTE — Progress Notes (Signed)
 Son appeared at bedside at this time and asked for someone to reposition his mom.  Myself and another RN went straight into room and repositioned patient.  Son verbally loud and pacing in the room expressing a great deal of disappointment and frustration saying that "everyday there are 9 nurses up there not doing their job.  I'm filing a complaint."  Son was asked if I could get someone for him to talk to and son interrupted me proclaiming "maam. I don't need to hear anything from you.  I can feed my mom."  Son gave me no opportunity to explain that his mom was repositioned and linens changed within the past hour.

## 2023-05-11 DIAGNOSIS — R579 Shock, unspecified: Secondary | ICD-10-CM | POA: Diagnosis not present

## 2023-05-11 LAB — BASIC METABOLIC PANEL
Anion gap: 8 (ref 5–15)
BUN: 16 mg/dL (ref 8–23)
CO2: 28 mmol/L (ref 22–32)
Calcium: 8.5 mg/dL — ABNORMAL LOW (ref 8.9–10.3)
Chloride: 106 mmol/L (ref 98–111)
Creatinine, Ser: <0.3 mg/dL — ABNORMAL LOW (ref 0.44–1.00)
Glucose, Bld: 91 mg/dL (ref 70–99)
Potassium: 4.1 mmol/L (ref 3.5–5.1)
Sodium: 142 mmol/L (ref 135–145)

## 2023-05-11 LAB — CBC
HCT: 24.4 % — ABNORMAL LOW (ref 36.0–46.0)
Hemoglobin: 7.5 g/dL — ABNORMAL LOW (ref 12.0–15.0)
MCH: 31.8 pg (ref 26.0–34.0)
MCHC: 30.7 g/dL (ref 30.0–36.0)
MCV: 103.4 fL — ABNORMAL HIGH (ref 80.0–100.0)
Platelets: 278 10*3/uL (ref 150–400)
RBC: 2.36 MIL/uL — ABNORMAL LOW (ref 3.87–5.11)
RDW: 18.9 % — ABNORMAL HIGH (ref 11.5–15.5)
WBC: 5.5 10*3/uL (ref 4.0–10.5)
nRBC: 0 % (ref 0.0–0.2)

## 2023-05-11 LAB — MAGNESIUM: Magnesium: 1.8 mg/dL (ref 1.7–2.4)

## 2023-05-11 NOTE — Progress Notes (Signed)
 Pt has eaten 100% of her lunch.  Including all of the grape juice, magic cup, and chocolate pudding.  I turned on the tv for pt and she replied "that's Justice Rocher" smiling.

## 2023-05-11 NOTE — Progress Notes (Signed)
 Progress Note   Patient: Patricia Davies ZDG:[id number] DOB: [birth year] DOA: 04/24/2023     17 DOS: the patient was seen and examined on 05/11/2023   Brief hospital course: 78 year old  female lives at home with her daughter for the last 3 years due to advanced dementia.  She has a past medical history significant for sarcoidosis COPD hypertension, kidney stones B12 deficiency and dementia.  She has been struggling with dementia for the past 7 to 8 years.  Patient had gradually progressive decline in the past 3 years. Patient has very poor appetite with significant weight loss. On arrival to the emergency room she was obtunded hypotensive.  Treated for pneumonia.  At this time medically stable for discharge however disposition complicated by family dynamic.  Assessment and Plan:  Acute metabolic encephalopathy - Factorial etiology given initial sepsis and underlying dementia.  Appears to be resolved and patient back at her baseline.  Severe sepsis  - Initial systolic blood pressures in the 50s on presentation, hypotensive, tachycardic.  Completed 7 days of Unasyn 2/27.  Appears to be resolved.  Acute pneumonia - Chest x-ray 2/28 noting left upper lobe opacity.  Not currently hypoxic.  Completed antibiotic therapy.  Acute on chronic microcytic anemia - Status post 1 unit packed RBCs.  Hemoglobin appears to be stable.  No active bleeding.  Acute kidney injury - Resolved after IV fluid hydration.  Secondary to poor p.o. intake.  Continue to encourage p.o.  Advanced dementia with failure to thrive - Hospice and palliative care consulted, following closely.  Multiple pressure injuries present on admission -Per wound care-Sacrum wound is 100% tightly adhered eschar; Unstageable pressure injury; 5X6cm, Left hip is dark red-purple Deep tissue pressure injury; 6X5cm Right hip is Stage 3 pressure injury; 50% red, 30% yellow, 20% dark red-purple Deep tissue pressure injury to wound edges,  3.8X1.9cm Recommending-Apply Medihoney to sacrum and right hip wounds Q day then cover with foam dressing.  Change foam dressing Q 3 days or PRN soiling Apply Xeroform gauze to left hip wound Q day and cover with foam dressing.  Change foam dressing Q 3 days or PRN soiling.  Right Lower extremity DVT - Doppler imaging 3/2 noting acute DVT.  Continues on anticoagulation.  Goals of care - Disposition complicated by family dynamic.  Please review case management notes for more details.  Awaiting to clear space for home hospital bed.  Patient to be discharged home with home health.  Plan to discharge now delayed to 3/10.  Son states he needs time to clean and prepare for the hospital bed.         Subjective: Patient resting comfortably.  Denies any shortness of breath, chest pain, nausea, vomiting, abdominal pain.  Very pleasant this morning.  Physical Exam: Vitals:   05/10/23 0523 05/10/23 1700 05/11/23 0435 05/11/23 0530  BP: 135/76 131/69 112/65   Pulse: 73 82 (!) 103 96  Resp: 17 18 17    Temp: 97.6 F (36.4 C) 98.1 F (36.7 C) 100 F (37.8 C) 99.4 F (37.4 C)  TempSrc: Oral Oral Oral   SpO2: 98%  100%   Weight:      Height:       GENERAL:  Alert, pleasant, no acute distress, cachectic HEENT:  EOMI CARDIOVASCULAR:  RRR, no murmurs appreciated RESPIRATORY:  Clear to auscultation, no wheezing, rales, or rhonchi GASTROINTESTINAL:  Soft, nontender, nondistended EXTREMITIES: Thin, no LE edema bilaterally NEURO:  No new focal deficits appreciated SKIN:  No rashes noted PSYCH:  Appropriate mood and affect   Data Reviewed:  There are no new results to review at this time.  Family Communication: None at bedside  Disposition: Status is: Inpatient Remains inpatient appropriate because: Disposition planning  Planned Discharge Destination: Home with Home Health    Time spent: 26 minutes  Author: Deanna Artis, DO 05/11/2023 10:56 AM  For on call review [email or web address].

## 2023-05-11 NOTE — Progress Notes (Signed)
 Pt ate 100% of her dinner including all of her fish and mashed potatoes.  Pt loves the sweet fruit.

## 2023-05-12 ENCOUNTER — Other Ambulatory Visit (HOSPITAL_COMMUNITY): Payer: Self-pay

## 2023-05-12 ENCOUNTER — Other Ambulatory Visit: Payer: Self-pay

## 2023-05-12 ENCOUNTER — Inpatient Hospital Stay (HOSPITAL_COMMUNITY)
Admission: EM | Admit: 2023-05-12 | Discharge: 2023-05-23 | DRG: 871 | Disposition: A | Discharge status: Skilled Nursing Facility | Attending: Internal Medicine | Admitting: Internal Medicine

## 2023-05-12 ENCOUNTER — Encounter (HOSPITAL_COMMUNITY): Payer: Self-pay

## 2023-05-12 ENCOUNTER — Emergency Department (HOSPITAL_COMMUNITY)

## 2023-05-12 DIAGNOSIS — R627 Adult failure to thrive: Secondary | ICD-10-CM | POA: Diagnosis present

## 2023-05-12 DIAGNOSIS — Z833 Family history of diabetes mellitus: Secondary | ICD-10-CM

## 2023-05-12 DIAGNOSIS — Z7901 Long term (current) use of anticoagulants: Secondary | ICD-10-CM

## 2023-05-12 DIAGNOSIS — Z87442 Personal history of urinary calculi: Secondary | ICD-10-CM

## 2023-05-12 DIAGNOSIS — R911 Solitary pulmonary nodule: Secondary | ICD-10-CM | POA: Diagnosis not present

## 2023-05-12 DIAGNOSIS — J449 Chronic obstructive pulmonary disease, unspecified: Secondary | ICD-10-CM | POA: Diagnosis not present

## 2023-05-12 DIAGNOSIS — F03918 Unspecified dementia, unspecified severity, with other behavioral disturbance: Secondary | ICD-10-CM | POA: Diagnosis not present

## 2023-05-12 DIAGNOSIS — R531 Weakness: Secondary | ICD-10-CM | POA: Diagnosis not present

## 2023-05-12 DIAGNOSIS — R509 Fever, unspecified: Secondary | ICD-10-CM | POA: Diagnosis not present

## 2023-05-12 DIAGNOSIS — L89210 Pressure ulcer of right hip, unstageable: Secondary | ICD-10-CM | POA: Diagnosis present

## 2023-05-12 DIAGNOSIS — Z681 Body mass index (BMI) 19 or less, adult: Secondary | ICD-10-CM | POA: Diagnosis not present

## 2023-05-12 DIAGNOSIS — L89150 Pressure ulcer of sacral region, unstageable: Secondary | ICD-10-CM | POA: Diagnosis present

## 2023-05-12 DIAGNOSIS — R0902 Hypoxemia: Secondary | ICD-10-CM | POA: Diagnosis not present

## 2023-05-12 DIAGNOSIS — Z515 Encounter for palliative care: Secondary | ICD-10-CM

## 2023-05-12 DIAGNOSIS — D869 Sarcoidosis, unspecified: Secondary | ICD-10-CM | POA: Diagnosis present

## 2023-05-12 DIAGNOSIS — I959 Hypotension, unspecified: Secondary | ICD-10-CM | POA: Diagnosis not present

## 2023-05-12 DIAGNOSIS — R918 Other nonspecific abnormal finding of lung field: Secondary | ICD-10-CM | POA: Diagnosis not present

## 2023-05-12 DIAGNOSIS — I82501 Chronic embolism and thrombosis of unspecified deep veins of right lower extremity: Secondary | ICD-10-CM | POA: Diagnosis present

## 2023-05-12 DIAGNOSIS — F03C18 Unspecified dementia, severe, with other behavioral disturbance: Secondary | ICD-10-CM | POA: Diagnosis present

## 2023-05-12 DIAGNOSIS — I10 Essential (primary) hypertension: Secondary | ICD-10-CM | POA: Diagnosis present

## 2023-05-12 DIAGNOSIS — L89896 Pressure-induced deep tissue damage of other site: Secondary | ICD-10-CM | POA: Diagnosis present

## 2023-05-12 DIAGNOSIS — Z8249 Family history of ischemic heart disease and other diseases of the circulatory system: Secondary | ICD-10-CM

## 2023-05-12 DIAGNOSIS — E876 Hypokalemia: Secondary | ICD-10-CM | POA: Diagnosis not present

## 2023-05-12 DIAGNOSIS — L89154 Pressure ulcer of sacral region, stage 4: Secondary | ICD-10-CM

## 2023-05-12 DIAGNOSIS — Z87891 Personal history of nicotine dependence: Secondary | ICD-10-CM

## 2023-05-12 DIAGNOSIS — J9601 Acute respiratory failure with hypoxia: Secondary | ICD-10-CM | POA: Diagnosis present

## 2023-05-12 DIAGNOSIS — A419 Sepsis, unspecified organism: Secondary | ICD-10-CM | POA: Diagnosis not present

## 2023-05-12 DIAGNOSIS — J984 Other disorders of lung: Secondary | ICD-10-CM | POA: Diagnosis not present

## 2023-05-12 DIAGNOSIS — J9690 Respiratory failure, unspecified, unspecified whether with hypoxia or hypercapnia: Secondary | ICD-10-CM | POA: Diagnosis not present

## 2023-05-12 DIAGNOSIS — J8410 Pulmonary fibrosis, unspecified: Secondary | ICD-10-CM | POA: Diagnosis not present

## 2023-05-12 DIAGNOSIS — Z1152 Encounter for screening for COVID-19: Secondary | ICD-10-CM

## 2023-05-12 DIAGNOSIS — J690 Pneumonitis due to inhalation of food and vomit: Secondary | ICD-10-CM | POA: Diagnosis present

## 2023-05-12 DIAGNOSIS — D649 Anemia, unspecified: Secondary | ICD-10-CM | POA: Diagnosis not present

## 2023-05-12 DIAGNOSIS — E785 Hyperlipidemia, unspecified: Secondary | ICD-10-CM | POA: Diagnosis not present

## 2023-05-12 DIAGNOSIS — E43 Unspecified severe protein-calorie malnutrition: Secondary | ICD-10-CM | POA: Diagnosis not present

## 2023-05-12 DIAGNOSIS — R062 Wheezing: Secondary | ICD-10-CM | POA: Diagnosis not present

## 2023-05-12 DIAGNOSIS — B479 Mycetoma, unspecified: Secondary | ICD-10-CM | POA: Diagnosis present

## 2023-05-12 DIAGNOSIS — R933 Abnormal findings on diagnostic imaging of other parts of digestive tract: Secondary | ICD-10-CM | POA: Diagnosis not present

## 2023-05-12 DIAGNOSIS — J439 Emphysema, unspecified: Secondary | ICD-10-CM | POA: Diagnosis present

## 2023-05-12 DIAGNOSIS — Z7401 Bed confinement status: Secondary | ICD-10-CM | POA: Diagnosis not present

## 2023-05-12 DIAGNOSIS — K5900 Constipation, unspecified: Secondary | ICD-10-CM | POA: Diagnosis present

## 2023-05-12 DIAGNOSIS — D510 Vitamin B12 deficiency anemia due to intrinsic factor deficiency: Secondary | ICD-10-CM | POA: Diagnosis not present

## 2023-05-12 DIAGNOSIS — R0602 Shortness of breath: Secondary | ICD-10-CM | POA: Diagnosis not present

## 2023-05-12 DIAGNOSIS — E440 Moderate protein-calorie malnutrition: Secondary | ICD-10-CM | POA: Diagnosis present

## 2023-05-12 DIAGNOSIS — J90 Pleural effusion, not elsewhere classified: Secondary | ICD-10-CM | POA: Diagnosis not present

## 2023-05-12 DIAGNOSIS — L89616 Pressure-induced deep tissue damage of right heel: Secondary | ICD-10-CM | POA: Diagnosis present

## 2023-05-12 DIAGNOSIS — R000 Tachycardia, unspecified: Secondary | ICD-10-CM | POA: Diagnosis not present

## 2023-05-12 DIAGNOSIS — Z66 Do not resuscitate: Secondary | ICD-10-CM | POA: Diagnosis present

## 2023-05-12 DIAGNOSIS — R269 Unspecified abnormalities of gait and mobility: Secondary | ICD-10-CM | POA: Diagnosis not present

## 2023-05-12 DIAGNOSIS — I700 Atherosclerosis of aorta: Secondary | ICD-10-CM | POA: Diagnosis not present

## 2023-05-12 DIAGNOSIS — Z79899 Other long term (current) drug therapy: Secondary | ICD-10-CM

## 2023-05-12 DIAGNOSIS — R4189 Other symptoms and signs involving cognitive functions and awareness: Secondary | ICD-10-CM | POA: Diagnosis not present

## 2023-05-12 DIAGNOSIS — F0390 Unspecified dementia without behavioral disturbance: Secondary | ICD-10-CM | POA: Diagnosis not present

## 2023-05-12 DIAGNOSIS — E872 Acidosis, unspecified: Secondary | ICD-10-CM | POA: Diagnosis present

## 2023-05-12 DIAGNOSIS — Z90710 Acquired absence of both cervix and uterus: Secondary | ICD-10-CM

## 2023-05-12 DIAGNOSIS — E46 Unspecified protein-calorie malnutrition: Secondary | ICD-10-CM | POA: Diagnosis not present

## 2023-05-12 DIAGNOSIS — R579 Shock, unspecified: Secondary | ICD-10-CM | POA: Diagnosis not present

## 2023-05-12 DIAGNOSIS — Z9049 Acquired absence of other specified parts of digestive tract: Secondary | ICD-10-CM

## 2023-05-12 LAB — PROTIME-INR
INR: 1.2 (ref 0.8–1.2)
Prothrombin Time: 15.6 s — ABNORMAL HIGH (ref 11.4–15.2)

## 2023-05-12 LAB — COMPREHENSIVE METABOLIC PANEL
ALT: 23 U/L (ref 0–44)
AST: 30 U/L (ref 15–41)
Albumin: 2.7 g/dL — ABNORMAL LOW (ref 3.5–5.0)
Alkaline Phosphatase: 51 U/L (ref 38–126)
Anion gap: 9 (ref 5–15)
BUN: 15 mg/dL (ref 8–23)
CO2: 29 mmol/L (ref 22–32)
Calcium: 9 mg/dL (ref 8.9–10.3)
Chloride: 99 mmol/L (ref 98–111)
Creatinine, Ser: 0.63 mg/dL (ref 0.44–1.00)
GFR, Estimated: >60 mL/min (ref >60)
Glucose, Bld: 141 mg/dL — ABNORMAL HIGH (ref 70–99)
Potassium: 4.3 mmol/L (ref 3.5–5.1)
Sodium: 137 mmol/L (ref 135–145)
Total Bilirubin: 0.5 mg/dL (ref 0.0–1.2)
Total Protein: 7 g/dL (ref 6.5–8.1)

## 2023-05-12 LAB — CBC WITH DIFFERENTIAL/PLATELET
Abs Immature Granulocytes: 0.02 10*3/uL (ref 0.00–0.07)
Basophils Absolute: 0 10*3/uL (ref 0.0–0.1)
Basophils Relative: 0 %
Eosinophils Absolute: 0 10*3/uL (ref 0.0–0.5)
Eosinophils Relative: 1 %
HCT: 32.4 % — ABNORMAL LOW (ref 36.0–46.0)
Hemoglobin: 10.4 g/dL — ABNORMAL LOW (ref 12.0–15.0)
Immature Granulocytes: 1 %
Lymphocytes Relative: 15 %
Lymphs Abs: 0.7 10*3/uL (ref 0.7–4.0)
MCH: 32.8 pg (ref 26.0–34.0)
MCHC: 32.1 g/dL (ref 30.0–36.0)
MCV: 102.2 fL — ABNORMAL HIGH (ref 80.0–100.0)
Monocytes Absolute: 0.4 10*3/uL (ref 0.1–1.0)
Monocytes Relative: 9 %
Neutro Abs: 3.3 10*3/uL (ref 1.7–7.7)
Neutrophils Relative %: 74 %
Platelets: 328 10*3/uL (ref 150–400)
RBC: 3.17 MIL/uL — ABNORMAL LOW (ref 3.87–5.11)
RDW: 17.5 % — ABNORMAL HIGH (ref 11.5–15.5)
WBC: 4.4 10*3/uL (ref 4.0–10.5)
nRBC: 0 % (ref 0.0–0.2)

## 2023-05-12 LAB — I-STAT CG4 LACTIC ACID, ED
Lactic Acid, Venous: 3.4 mmol/L (ref 0.5–1.9)
Lactic Acid, Venous: 3.1 mmol/L (ref 0.5–1.9)

## 2023-05-12 LAB — RESP PANEL BY RT-PCR (RSV, FLU A&B, COVID)  RVPGX2
Influenza A by PCR: NEGATIVE
Influenza B by PCR: NEGATIVE
Resp Syncytial Virus by PCR: NEGATIVE
SARS Coronavirus 2 by RT PCR: NEGATIVE

## 2023-05-12 LAB — APTT: aPTT: 32 s (ref 24–36)

## 2023-05-12 MED ORDER — LACTATED RINGERS IV BOLUS (SEPSIS)
1000.0000 mL | Freq: Once | INTRAVENOUS | Status: AC
  Administered 2023-05-12: 1000 mL via INTRAVENOUS

## 2023-05-12 MED ORDER — METRONIDAZOLE 500 MG/100ML IV SOLN
500.0000 mg | Freq: Once | INTRAVENOUS | Status: AC
  Administered 2023-05-12: 500 mg via INTRAVENOUS
  Filled 2023-05-12: qty 100

## 2023-05-12 MED ORDER — LACTATED RINGERS IV SOLN: INTRAVENOUS | Status: DC

## 2023-05-12 MED ORDER — MEDIHONEY WOUND/BURN DRESSING EX PSTE: 1.0000 | PASTE | Freq: Every day | CUTANEOUS | 0 refills | Status: AC

## 2023-05-12 MED ORDER — LACTATED RINGERS IV BOLUS
500.0000 mL | Freq: Once | INTRAVENOUS | Status: AC
  Administered 2023-05-12: 500 mL via INTRAVENOUS

## 2023-05-12 MED ORDER — SODIUM CHLORIDE 0.9 % IV SOLN
2.0000 g | Freq: Once | INTRAVENOUS | Status: AC
  Administered 2023-05-12: 2 g via INTRAVENOUS
  Filled 2023-05-12: qty 12.5

## 2023-05-12 MED ORDER — ACETAMINOPHEN 650 MG RE SUPP
650.0000 mg | Freq: Once | RECTAL | Status: AC
  Administered 2023-05-12: 650 mg via RECTAL
  Filled 2023-05-12: qty 1

## 2023-05-12 MED ORDER — VANCOMYCIN HCL IN DEXTROSE 1-5 GM/200ML-% IV SOLN
1000.0000 mg | Freq: Once | INTRAVENOUS | Status: AC
  Administered 2023-05-12: 1000 mg via INTRAVENOUS
  Filled 2023-05-12: qty 200

## 2023-05-12 MED ORDER — IOHEXOL 350 MG/ML SOLN
75.0000 mL | Freq: Once | INTRAVENOUS | Status: AC | PRN
  Administered 2023-05-12: 75 mL via INTRAVENOUS

## 2023-05-12 NOTE — Progress Notes (Signed)
 TOC discharge meds delivered to pt's nurse  in a secure bag by this RN

## 2023-05-12 NOTE — Plan of Care (Signed)
  Problem: Health Behavior/Discharge Planning: Goal: Ability to manage health-related needs will improve Outcome: Progressing   Problem: Clinical Measurements: Goal: Ability to maintain clinical measurements within normal limits will improve Outcome: Progressing Goal: Will remain free from infection Outcome: Progressing Goal: Diagnostic test results will improve Outcome: Progressing Goal: Respiratory complications will improve Outcome: Progressing Goal: Cardiovascular complication will be avoided Outcome: Progressing   Problem: Activity: Goal: Risk for activity intolerance will decrease Outcome: Progressing   Problem: Nutrition: Goal: Adequate nutrition will be maintained Outcome: Progressing   Problem: Coping: Goal: Level of anxiety will decrease Outcome: Progressing   Problem: Elimination: Goal: Will not experience complications related to bowel motility Outcome: Progressing Goal: Will not experience complications related to urinary retention Outcome: Progressing   Problem: Pain Managment: Goal: General experience of comfort will improve and/or be controlled Outcome: Progressing   Problem: Safety: Goal: Ability to remain free from injury will improve Outcome: Progressing   Problem: Skin Integrity: Goal: Risk for impaired skin integrity will decrease Outcome: Progressing   Problem: Fluid Volume: Goal: Hemodynamic stability will improve Outcome: Progressing   Problem: Clinical Measurements: Goal: Diagnostic test results will improve Outcome: Progressing Goal: Signs and symptoms of infection will decrease Outcome: Progressing   Problem: Respiratory: Goal: Ability to maintain adequate ventilation will improve Outcome: Progressing   Problem: Activity: Goal: Ability to tolerate increased activity will improve Outcome: Progressing   Problem: Clinical Measurements: Goal: Ability to maintain a body temperature in the normal range will improve Outcome:  Progressing   Problem: Respiratory: Goal: Ability to maintain adequate ventilation will improve Outcome: Progressing Goal: Ability to maintain a clear airway will improve Outcome: Progressing   Problem: Respiratory: Goal: Ability to maintain adequate ventilation will improve Outcome: Progressing Goal: Ability to maintain a clear airway will improve Outcome: Progressing

## 2023-05-12 NOTE — ED Triage Notes (Signed)
 Pt was discharged from [facility] today, to which she was admitted for pneumonia, she was severely sick at home and was severe sepsis on 20th of February. She was D/c at 1pm this afternoon and she is potentially septic again. She is not oxygenating well, She has had some shortness of breath.   Medic vitals   120hr 148/96 91%ra 146bgl 26rr 101.6 temp  20g left AC  5mg  albuterol

## 2023-05-12 NOTE — Discharge Summary (Signed)
 Physician Discharge Summary   Patient: Patricia Davies MRN: [id number] DOB: 07-23-1945  Admit date:     04/24/2023  Discharge date: 05/12/23  Discharge Physician: Deanna Artis   PCP: [name], MD   Recommendations at discharge:   At this time patient will be discharged home with home health.  If you experience any symptoms such as fever, vomiting, shortness of breath, chest pain, abdominal pain, or other concerning symptoms, please call your primary care provider or go to the emergency department immediately.  Discharge Diagnoses: Principal Problem:   Shock (HCC) Active Problems:   Severe dementia without behavioral disturbance, psychotic disturbance, mood disturbance, or anxiety (HCC)   Hypovolemia   Failure to thrive in adult   Need for emotional support   Counseling and coordination of care   Goals of care, counseling/discussion   Palliative care encounter  Resolved Problems:   * No resolved hospital problems. *  Hospital Course: 78 year old female lives at home with her daughter for the last 3 years due to advanced dementia. She has a past medical history significant for sarcoidosis COPD hypertension, kidney stones B12 deficiency and dementia. She has been struggling with dementia for the past 7 to 8 years. Patient had gradually progressive decline in the past 3 years. Patient has very poor appetite with significant weight loss. On arrival to the emergency room she was obtunded hypotensive. Treated for pneumonia. At this time medically stable for discharge however disposition complicated by family dynamic.   Assessment and Plan: Acute metabolic encephalopathy - Factorial etiology given initial sepsis and underlying dementia.  Appears to be resolved and patient back at her baseline.   Severe sepsis  - Initial systolic blood pressures in the 50s on presentation, hypotensive, tachycardic.  Completed 7 days of Unasyn 2/27.  Appears to be resolved.   Acute pneumonia -  Chest x-ray 2/28 noting left upper lobe opacity.  Not currently hypoxic.  Completed antibiotic therapy.   Acute on chronic microcytic anemia - Status post 1 unit packed RBCs.  Hemoglobin appears to be stable.  No active bleeding.   Acute kidney injury - Resolved after IV fluid hydration.  Secondary to poor p.o. intake.  Continue to encourage p.o.   Advanced dementia with failure to thrive - Hospice and palliative care consulted, following closely.   Multiple pressure injuries present on admission -Per wound care-Sacrum wound is 100% tightly adhered eschar; Unstageable pressure injury; 5X6cm, Left hip is dark red-purple Deep tissue pressure injury; 6X5cm Right hip is Stage 3 pressure injury; 50% red, 30% yellow, 20% dark red-purple Deep tissue pressure injury to wound edges, 3.8X1.9cm Recommending-Apply Medihoney to sacrum and right hip wounds Q day then cover with foam dressing.  Change foam dressing Q 3 days or PRN soiling Apply Xeroform gauze to left hip wound Q day and cover with foam dressing.  Change foam dressing Q 3 days or PRN soiling.   Right Lower extremity DVT - Doppler imaging 3/2 noting acute DVT.  Continues on anticoagulation.   Goals of care - Disposition complicated by family dynamic.  Please review case management notes for more details.  Awaiting to clear space for home hospital bed.  Patient to be discharged home with home health.         Consultants: Palliative care Procedures performed: None Disposition: Home health Diet recommendation:  Discharge Diet Orders (From admission, onward)     Start     Ordered   05/12/23 0000  Diet - low sodium heart healthy  05/12/23 1058           Regular diet DISCHARGE MEDICATION: Allergies as of 05/12/2023   No Known Allergies      Medication List     STOP taking these medications    ibuprofen 200 MG tablet Commonly known as: ADVIL       TAKE these medications    acetaminophen 325 MG  tablet Commonly known as: TYLENOL Take 650 mg by mouth every 6 (six) hours as needed.   Apixaban Starter Pack (10mg  and 5mg ) Commonly known as: ELIQUIS STARTER PACK Take as directed on package: start with two-5mg  tablets twice daily for 7 days. On day 8, switch to one-5mg  tablet twice daily.   feeding supplement Liqd Take 237 mLs by mouth 2 (two) times daily between meals.   folic acid 1 MG tablet Commonly known as: FOLVITE Take 1 tablet (1 mg total) by mouth daily.   leptospermum manuka honey Pste paste Apply 1 Application topically at bedtime.   memantine 10 MG tablet Commonly known as: NAMENDA Take 1 tablet (10 mg at night) for 2 weeks, then increase to 1 tablet (10 mg) twice a day What changed:  how much to take how to take this when to take this   polyethylene glycol powder 17 GM/SCOOP powder Commonly known as: GLYCOLAX/MIRALAX Take 17 g by mouth daily.   traMADol 50 MG tablet Commonly known as: Ultram Take 1 tablet (50 mg total) by mouth every 12 (twelve) hours as needed for up to 7 days for moderate pain (pain score 4-6).               Durable Medical Equipment  (From admission, onward)           Start     Ordered   05/08/23 0827  For home use only DME Air overlay mattress  Once        05/08/23 0827   05/07/23 1610  For home use only DME Other see comment  Once       Comments: Michiel Sites lift  Question:  Length of Need  Answer:  12 Months   05/07/23 1610   05/06/23 1628  For home use only DME standard manual wheelchair with seat cushion  Once       Comments: Patient suffers from Dementia which impairs their ability to perform daily activities like bathing/feeding/grooming in the home.  A walker will not resolve issue with performing activities of daily living. A wheelchair will allow patient to safely perform daily activities. Patient can safely propel the wheelchair in the home or has a caregiver who can provide assistance. Length of need  Lifetime. Accessories: elevating leg rests (ELRs), wheel locks, extensions and anti-tippers.   05/06/23 1628   04/30/23 1256  For home use only DME Hospital bed  Once       Question Answer Comment  Length of Need Lifetime   The above medical condition requires: Patient requires the ability to reposition frequently   Bed type Semi-electric   Support Surface: Low Air loss Mattress      04/30/23 1255              Discharge Care Instructions  (From admission, onward)           Start     Ordered   05/12/23 0000  Discharge wound care:       Comments: 1. Apply Medihoney to sacrum and right hip wounds daily then cover with foam dressing.  Change foam dressing  every 3 days or PRN soiling 2. Apply Xeroform gauze to left hip wound daily and cover with foam dressing.  Change foam dressing every 3 days or PRN soiling   05/12/23 1058            Follow-up Information     [facility] Follow up.   Contact information: [facility]) [address] [place], [place] [address] [phone]        [facility] Follow up.   Contact information: [address] [place] [address] [phone]                Discharge Exam: Filed Weights   05/03/23 0437 05/05/23 0500 05/06/23 0500  Weight: 44.5 kg 44 kg 44 kg   GENERAL:  Alert, pleasant, no acute distress, cachectic HEENT:  EOMI CARDIOVASCULAR:  RRR, no murmurs appreciated RESPIRATORY:  Clear to auscultation, no wheezing, rales, or rhonchi GASTROINTESTINAL:  Soft, nontender, nondistended EXTREMITIES: Thin, no LE edema bilaterally NEURO:  No new focal deficits appreciated SKIN:  No rashes noted PSYCH:  Appropriate mood and affect   Condition at discharge: improving  The results of significant diagnostics from this hospitalization (including imaging, microbiology, ancillary and laboratory) are listed below for reference.   Imaging Studies: VAS [place] LOWER EXTREMITY VENOUS  (DVT) Result Date: 05/04/2023  Lower Venous DVT Study Patient Name:  JATZIRY WECHTER  Date of Exam:   05/04/2023 Medical Rec #: [id number]     Accession #:    [id number] Date of Birth: August 27, 1945     Patient Gender: F Patient Age:   89 years Exam Location:  [facility] Procedure:      VAS [place] LOWER EXTREMITY VENOUS (DVT) Referring Phys: [facility] EZEND[place]A --------------------------------------------------------------------------------  Indications: Swelling, Edema, and Recent SVT in right upper extremity.  Comparison Study: No prior exam. Performing Technologist: Fernande Bras  Examination Guidelines: A complete evaluation includes B-mode imaging, spectral Doppler, color Doppler, and power Doppler as needed of all accessible portions of each vessel. Bilateral testing is considered an integral part of a complete examination. Limited examinations for reoccurring indications may be performed as noted. The reflux portion of the exam is performed with the patient in reverse Trendelenburg.  +---------+---------------+---------+-----------+----------+-----------------+ RIGHT    CompressibilityPhasicitySpontaneityPropertiesThrombus Aging    +---------+---------------+---------+-----------+----------+-----------------+ CFV      Partial        Yes      Yes                  Age Indeterminate +---------+---------------+---------+-----------+----------+-----------------+ SFJ      Full           Yes      Yes                                    +---------+---------------+---------+-----------+----------+-----------------+ FV Prox  Partial        Yes      Yes                  Acute             +---------+---------------+---------+-----------+----------+-----------------+ FV Mid   Full                                                           +---------+---------------+---------+-----------+----------+-----------------+  FV DistalFull                                                            +---------+---------------+---------+-----------+----------+-----------------+ PFV      Full                                                           +---------+---------------+---------+-----------+----------+-----------------+ POP      Full           No       Yes                                    +---------+---------------+---------+-----------+----------+-----------------+ PTV      Full                                                           +---------+---------------+---------+-----------+----------+-----------------+ PERO     Full                                                           +---------+---------------+---------+-----------+----------+-----------------+ Acute thrombus noted in a short segment of the proximal femoral vein and an age indeterminate thrombus oscillating within the common femoral vein noted.  +---------+---------------+---------+-----------+----------+--------------+ LEFT     CompressibilityPhasicitySpontaneityPropertiesThrombus Aging +---------+---------------+---------+-----------+----------+--------------+ CFV      Full           Yes      Yes                                 +---------+---------------+---------+-----------+----------+--------------+ SFJ      Full           Yes      Yes                                 +---------+---------------+---------+-----------+----------+--------------+ FV Prox  Full                                                        +---------+---------------+---------+-----------+----------+--------------+ FV Mid   Full                                                        +---------+---------------+---------+-----------+----------+--------------+ FV DistalFull                                                        +---------+---------------+---------+-----------+----------+--------------+  PFV      Full                                                         +---------+---------------+---------+-----------+----------+--------------+ POP      Full           No       Yes                                 +---------+---------------+---------+-----------+----------+--------------+ PTV      Full                                                        +---------+---------------+---------+-----------+----------+--------------+ PERO     Full                                                        +---------+---------------+---------+-----------+----------+--------------+     Summary: RIGHT: - Findings consistent with acute deep vein thrombosis involving the right femoral vein.  - Findings consistent with age indeterminate deep vein thrombosis involving the right common femoral vein.  - No cystic structure found in the popliteal fossa.  LEFT: - There is no evidence of deep vein thrombosis in the lower extremity.  - No cystic structure found in the popliteal fossa.  *See table(s) above for measurements and observations. Electronically signed by Coral Else MD on 05/04/2023 at 9:27:44 PM.    Final    DG CHEST PORT 1 VIEW Result Date: 05/02/2023 CLINICAL DATA:  Fluid overload. History of sarcoidosis, COPD and hypertension. EXAM: PORTABLE CHEST 1 VIEW COMPARISON:  Radiographs 04/29/2023 and 04/25/2023.  CT 08/08/2016. FINDINGS: 0823 hours. The heart size and mediastinal contours are stable with bilateral superior hilar retraction and central enlargement of the pulmonary arteries. There is severe upper lobe predominant chronic lung disease with scarring and volume loss. Asymmetrically increased density at the left apex is unchanged from recent prior studies, although increased from Jun 15, 2019. No other superimposed airspace disease, significant pleural effusion or pneumothorax identified. The bones appear unchanged. Telemetry leads overlie the chest. IMPRESSION: 1. No change from recent prior studies. 2. Chronic upper lobe predominant fibrotic lung disease consistent  with sarcoidosis. As discussed on recent prior studies, increased left apical density could be secondary to progressive scarring, superimposed infection or developing mass. Electronically Signed   By: Carey Bullocks M.D.   On: 05/02/2023 12:18   VAS [place] UPPER EXTREMITY VENOUS DUPLEX Result Date: 05/02/2023 UPPER VENOUS STUDY  Patient Name:  KLOEY CAZAREZ  Date of Exam:   05/01/2023 Medical Rec #: [id number]     Accession #:    [id number] Date of Birth: [birth year]     Patient Gender: F Patient Age:   26 years Exam Location:  [facility] Procedure:      VAS [place] UPPER EXTREMITY VENOUS DUPLEX Referring Phys: [facility] EZEND[place]A --------------------------------------------------------------------------------  Indications: Swelling Risk Factors: Past pregnancy. Anticoagulation: Eliquis. Limitations: Limited range of  motion & small vessels. Comparison Study: None. Performing Technologist: Shona Simpson  Examination Guidelines: A complete evaluation includes B-mode imaging, spectral Doppler, color Doppler, and power Doppler as needed of all accessible portions of each vessel. Bilateral testing is considered an integral part of a complete examination. Limited examinations for reoccurring indications may be performed as noted.  Right Findings: +----------+------------+---------+-----------+------------------+-------+ RIGHT     CompressiblePhasicitySpontaneous    Properties    Summary +----------+------------+---------+-----------+------------------+-------+ IJV           Full       Yes       Yes                              +----------+------------+---------+-----------+------------------+-------+ Subclavian    Full       Yes       Yes                              +----------+------------+---------+-----------+------------------+-------+ Axillary      Full       Yes       Yes                              +----------+------------+---------+-----------+------------------+-------+ Brachial       Full       Yes       Yes                              +----------+------------+---------+-----------+------------------+-------+ Radial        Full       Yes       Yes                              +----------+------------+---------+-----------+------------------+-------+ Ulnar         Full       Yes       Yes                              +----------+------------+---------+-----------+------------------+-------+ Cephalic      None                 No     brightly echogenic Acute  +----------+------------+---------+-----------+------------------+-------+ Basilic       Full                 Yes                              +----------+------------+---------+-----------+------------------+-------+ Occlusive thrombosis seen thoughout the length of the Cephalic Vein.  Left Findings: +----+------------+---------+-----------+----------+-------+ LEFTCompressiblePhasicitySpontaneousPropertiesSummary +----+------------+---------+-----------+----------+-------+ IJV     Full       Yes       Yes                      +----+------------+---------+-----------+----------+-------+  Summary:  Right: No evidence of deep vein thrombosis in the upper extremity. Findings consistent with acute superficial vein thrombosis involving the right cephalic vein.  Left: No evidence of thrombosis in the subclavian.  *See table(s) above for measurements and observations.  Diagnosing physician: Carolynn Sayers Electronically signed by Carolynn Sayers on 05/02/2023 at 7:18:20 AM.    Final  DG Chest 1 View Result Date: 04/29/2023 CLINICAL DATA:  Hypoxia. EXAM: CHEST  1 VIEW COMPARISON:  Chest x-ray dated April 25, 2023. FINDINGS: Stable cardiomediastinal silhouette with normal heart size. Chronic upper lobe predominant fibrotic interstitial lung disease again noted, with similar superimposed increased density in the left upper lobe. Suspected new small left pleural effusion with mildly increased left  basilar opacity. No pneumothorax. No acute osseous abnormality. IMPRESSION: 1. Suspected new small left pleural effusion with mildly increased left basilar opacity, atelectasis versus pneumonia. 2. Chronic upper lobe predominant fibrotic interstitial lung disease with similar superimposed increased density in the left upper lobe, which could reflect superimposed pneumonia, progressive scarring, or new mass lesion. Electronically Signed   By: Obie Dredge M.D.   On: 04/29/2023 12:32   DG Chest 1 View Result Date: 04/25/2023 CLINICAL DATA:  Hypoxia. EXAM: CHEST  1 VIEW COMPARISON:  April 24, 2023. FINDINGS: Stable cardiomediastinal silhouette. Continued left upper lobe opacity is noted concerning for pneumonia, scarring or possibly underlying malignancy. Stable right lung opacities are noted concerning for scarring or inflammation. IMPRESSION: Continued left upper lobe opacity is noted concerning for pneumonia, scarring or possibly underlying malignancy. CT scan of the chest is recommended for further evaluation as noted on prior exam. Stable probable right lung scarring. Electronically Signed   By: Lupita Raider M.D.   On: 04/25/2023 13:21   US Abdomen Limited RUQ (LIVER/GB) Result Date: 04/24/2023 CLINICAL DATA:  Sepsis. EXAM: ULTRASOUND ABDOMEN LIMITED RIGHT UPPER QUADRANT COMPARISON:  August 28, 2022. FINDINGS: Gallbladder: Status post cholecystectomy. Common bile duct: Diameter: 4 mm which is within normal limits. Liver: No focal lesion identified. Within normal limits in parenchymal echogenicity. Portal vein is patent on color Doppler imaging with normal direction of blood flow towards the liver. Other: None. IMPRESSION: No acute abnormality seen in the right upper quadrant of the abdomen. Electronically Signed   By: Lupita Raider M.D.   On: 04/24/2023 14:04   CT Head Wo Contrast Result Date: 04/24/2023 CLINICAL DATA:  Mental status change, unknown cause EXAM: CT HEAD WITHOUT CONTRAST TECHNIQUE:  Contiguous axial images were obtained from the base of the skull through the vertex without intravenous contrast. RADIATION DOSE REDUCTION: This exam was performed according to the departmental dose-optimization program which includes automated exposure control, adjustment of the mA and/or kV according to patient size and/or use of iterative reconstruction technique. COMPARISON:  CT head April 20, 23. FINDINGS: Motion limited study.  Within this limitation: Brain: No evidence of acute infarction, hemorrhage, hydrocephalus, extra-axial collection or mass lesion/mass effect. Patchy white matter hypodensities, nonspecific but compatible with chronic microvascular disease. Cerebral atrophy. Vascular: No hyperdense vessel identified. Calcific atherosclerosis. Skull: No acute fracture. Sinuses/Orbits: Clear sinuses.  No acute orbital findings. Other: No mastoid effusions. IMPRESSION: 1. No evidence of acute intracranial abnormality. 2.  Cerebral Atrophy (ICD10-G31.9). Electronically Signed   By: Feliberto Harts M.D.   On: 04/24/2023 12:42   DG Chest Portable 1 View Result Date: 04/24/2023 CLINICAL DATA:  Questionable sepsis. EXAM: PORTABLE CHEST 1 VIEW COMPARISON:  Radiographs 07/22/2019 and 05/06/2017.  CT 08/08/2016. FINDINGS: 1145 hours. The heart size and mediastinal contours are grossly stable. There is severe chronic lung disease with biapical scarring and superior hilar retraction bilaterally. Interval increased density at the left lung apex which could reflect progressive scarring, superimposed infection or developing mass. No significant change identified within the right hemithorax. There is no pleural effusion or pneumothorax. The bones appear unchanged. IMPRESSION: 1. Increased left apical  opacity and volume loss compared with prior studies from 4 years ago. Findings could be secondary to progressive scarring, superimposed infection or developing mass. Correlate clinically. CT may be helpful for further  evaluation. 2. Otherwise grossly stable chronic lung disease attributed to underlying sarcoidosis. Electronically Signed   By: Carey Bullocks M.D.   On: 04/24/2023 12:19    Microbiology: Results for orders placed or performed during the hospital encounter of 04/24/23  Resp panel by RT-PCR (RSV, Flu A&B, Covid) Anterior Nasal Swab     Status: None   Collection Time: 04/24/23 10:41 AM   Specimen: Anterior Nasal Swab  Result Value Ref Range Status   SARS Coronavirus 2 by RT PCR NEGATIVE NEGATIVE Final    Comment: (NOTE) SARS-CoV-2 target nucleic acids are NOT DETECTED.  The SARS-CoV-2 RNA is generally detectable in upper respiratory specimens during the acute phase of infection. The lowest concentration of SARS-CoV-2 viral copies this assay can detect is 138 copies/mL. A negative result does not preclude SARS-Cov-2 infection and should not be used as the sole basis for treatment or other patient management decisions. A negative result may occur with  improper specimen collection/handling, submission of specimen other than nasopharyngeal swab, presence of viral mutation(s) within the areas targeted by this assay, and inadequate number of viral copies(<138 copies/mL). A negative result must be combined with clinical observations, patient history, and epidemiological information. The expected result is Negative.  Fact Sheet for Patients:  BloggerCourse.com  Fact Sheet for Healthcare Providers:  SeriousBroker.it  This test is no t yet approved or cleared by the [place] FDA and  has been authorized for detection and/or diagnosis of SARS-CoV-2 by FDA under an Emergency Use Authorization (EUA). This EUA will remain  in effect (meaning this test can be used) for the duration of the COVID-19 declaration under Section 564(b)(1) of the Act, 21 U.S.C.section 360bbb-3(b)(1), unless the authorization is terminated  or revoked sooner.        Influenza A by PCR NEGATIVE NEGATIVE Final   Influenza B by PCR NEGATIVE NEGATIVE Final    Comment: (NOTE) The Xpert Xpress SARS-CoV-2/FLU/RSV plus assay is intended as an aid in the diagnosis of influenza from Nasopharyngeal swab specimens and should not be used as a sole basis for treatment. Nasal washings and aspirates are unacceptable for Xpert Xpress SARS-CoV-2/FLU/RSV testing.  Fact Sheet for Patients: BloggerCourse.com  Fact Sheet for Healthcare Providers: SeriousBroker.it  This test is not yet approved or cleared by the [place] FDA and has been authorized for detection and/or diagnosis of SARS-CoV-2 by FDA under an Emergency Use Authorization (EUA). This EUA will remain in effect (meaning this test can be used) for the duration of the COVID-19 declaration under Section 564(b)(1) of the Act, 21 U.S.C. section 360bbb-3(b)(1), unless the authorization is terminated or revoked.     Resp Syncytial Virus by PCR NEGATIVE NEGATIVE Final    Comment: (NOTE) Fact Sheet for Patients: BloggerCourse.com  Fact Sheet for Healthcare Providers: [email or web address]  This test is not yet approved or cleared by the [place] FDA and has been authorized for detection and/or diagnosis of SARS-CoV-2 by FDA under an Emergency Use Authorization (EUA). This EUA will remain in effect (meaning this test can be used) for the duration of the COVID-19 declaration under Section 564(b)(1) of the Act, 21 U.S.C. section 360bbb-3(b)(1), unless the authorization is terminated or revoked.  Performed at [facility], [address] [place], [place] [address]   Blood Culture (routine x 2)  Status: None   Collection Time: 04/24/23 10:50 AM   Specimen: BLOOD  Result Value Ref Range Status   Specimen Description   Final    BLOOD RIGHT ANTECUBITAL Performed at [facility], [address], [place], [place] [address]    Special Requests   Final    BOTTLES DRAWN AEROBIC ONLY Blood Culture results may not be optimal due to an inadequate volume of blood received in culture bottles Performed at [facility], [address] [place], [place] [address]    Culture   Final    NO GROWTH 5 DAYS Performed at [facility], [address], [place], [place] [address]    Report Status 04/29/2023 FINAL  Final  Blood Culture (routine x 2)     Status: None   Collection Time: 04/24/23  4:00 PM   Specimen: BLOOD RIGHT ARM  Result Value Ref Range Status   Specimen Description   Final    BLOOD RIGHT ARM Performed at [facility], [address], [place], [place] [address]    Special Requests   Final    BOTTLES DRAWN AEROBIC ONLY Blood Culture results may not be optimal due to an inadequate volume of blood received in culture bottles Performed at [facility], [address], [place], [place] [address]    Culture   Final    NO GROWTH 5 DAYS Performed at [facility], [address], [place], [place] [address]    Report Status 04/29/2023 FINAL  Final  MRSA Next Gen by PCR, Nasal     Status: None   Collection Time: 04/25/23  4:32 PM   Specimen: Nasal Mucosa; Nasal Swab  Result Value Ref Range Status   MRSA by PCR Next Gen NOT DETECTED NOT DETECTED Final    Comment: (NOTE) The GeneXpert MRSA Assay (FDA approved for NASAL specimens only), is one component of a comprehensive MRSA colonization surveillance program. It is not intended to diagnose MRSA infection nor to guide or monitor treatment for MRSA infections. Test performance is not FDA approved in patients less than 55 years old. Performed at [facility], [address] [place], [place] [address]     Labs: CBC: Recent Labs  Lab 05/06/23 0457 05/07/23 0745 05/08/23 0441 05/11/23 0451  WBC 11.2* 8.4  7.0 5.5  HGB 8.1* 9.1* 8.1* 7.5*  HCT 27.1* 30.1* 26.4* 24.4*  MCV 105.4* 103.4* 105.2* 103.4*  PLT 339 352 301 278   Basic Metabolic Panel: Recent Labs  Lab 05/06/23 0457 05/07/23 0745 05/08/23 0441 05/11/23 0451  NA 138 141 140 142  K 3.1* 3.7 3.5 4.1  CL 101 103 105 106  CO2 29 30 27 28   GLUCOSE 98 90 93 91  BUN 18 16 13 16   CREATININE 0.46 0.41* 0.37* <0.30*  CALCIUM 8.1* 8.4* 8.4* 8.5*  MG 1.7  --  1.8 1.8   Liver Function Tests: No results for input(s): "AST", "ALT", "ALKPHOS", "BILITOT", "PROT", "ALBUMIN" in the last 168 hours. CBG: No results for input(s): "GLUCAP" in the last 168 hours.  Discharge time spent: less than 30 minutes.  Signed: Deanna Artis, DO [facility] 05/12/2023

## 2023-05-12 NOTE — ED Notes (Signed)
 Please give an update to Chrissie Noa (brother), number is on emergency contacts

## 2023-05-12 NOTE — ED Provider Notes (Signed)
 [place] EMERGENCY DEPARTMENT AT [facility] Provider Note   CSN: [id number] Arrival date & time: 05/12/23  1934     History  Chief Complaint  Patient presents with   Shortness of Breath    Patricia Davies is a 78 y.o. female.  Pt is a 78 year old female lives at home with her daughter for the last 3 years due to advanced dementia. She has a past medical history significant for sarcoidosis COPD hypertension, kidney stones B12 deficiency and dementia who was discharged from the hospital today after being admitted for severe septic shock with pneumonia that improved after 7 days of Unasyn.  Patient got home earlier today does wear chronic oxygen and family noted that later this evening she developed a fever, altered mental status and did not look well.  Family did report she had some shortness of breath.  When fire arrived patient's sats were in the 60s and she received a DuoNeb prior to arrival.  Patient is awake and responsive here but does not answer questions.  The history is provided by the EMS personnel and medical records. The history is limited by the condition of the patient and the absence of a caregiver.  Shortness of Breath      Home Medications Prior to Admission medications   Medication Sig Start Date End Date Taking? Authorizing Provider  acetaminophen (TYLENOL) 325 MG tablet Take 650 mg by mouth every 6 (six) hours as needed.    [provider]  APIXABAN Everlene Balls) VTE STARTER PACK (10MG  AND 5MG ) Take as directed on package: start with two-5mg  tablets twice daily for 7 days. On day 8, switch to one-5mg  tablet twice daily. 05/06/23   Briant Cedar, MD  feeding supplement (ENSURE ENLIVE / ENSURE PLUS) LIQD Take 237 mLs by mouth 2 (two) times daily between meals. 05/06/23   Briant Cedar, MD  folic acid (FOLVITE) 1 MG tablet Take 1 tablet (1 mg total) by mouth daily. 05/06/23 06/11/23  Briant Cedar, MD  leptospermum manuka honey (MEDIHONEY)  PSTE paste Apply 1 Application topically at bedtime. 05/12/23   Deanna Artis, DO  memantine (NAMENDA) 10 MG tablet Take 1 tablet (10 mg at night) for 2 weeks, then increase to 1 tablet (10 mg) twice a day Patient taking differently: Take 10 mg by mouth See admin instructions. Take 1 tablet (10 mg at night) for 2 weeks, then increase to 1 tablet (10 mg) twice a day 09/07/21   Marcos Eke, PA-C  polyethylene glycol powder (GLYCOLAX/MIRALAX) 17 GM/SCOOP powder Take 17 g by mouth daily. 05/06/23   Briant Cedar, MD  traMADol (ULTRAM) 50 MG tablet Take 1 tablet (50 mg total) by mouth every 12 (twelve) hours as needed for up to 7 days for moderate pain (pain score 4-6). 05/06/23 05/19/23  Briant Cedar, MD      Allergies    Patient has no known allergies.    Review of Systems   Review of Systems  Respiratory:  Positive for shortness of breath.     Physical Exam Updated Vital Signs BP 132/73   Pulse (!) 110   Temp 100.1 F (37.8 C) (Oral)   Resp 18   SpO2 100%  Physical Exam Vitals and nursing note reviewed.  Constitutional:      General: She is not in acute distress.    Appearance: She is well-developed and underweight.  HENT:     Head: Normocephalic and atraumatic.     Mouth/Throat:  Mouth: Mucous membranes are dry.  Eyes:     Pupils: Pupils are equal, round, and reactive to light.  Cardiovascular:     Rate and Rhythm: Normal rate and regular rhythm.     Heart sounds: Murmur heard.     No friction rub.  Pulmonary:     Effort: Pulmonary effort is normal.     Breath sounds: Rhonchi present. No wheezing or rales.  Abdominal:     General: Bowel sounds are normal. There is no distension.     Palpations: Abdomen is soft.     Tenderness: There is no abdominal tenderness. There is no guarding or rebound.  Musculoskeletal:        General: Tenderness present. Normal range of motion.     Comments: No edema large sacral decubitus wound with foul-smelling green discharge  and eschar present.  Also wound with eschar over the right hip  Skin:    General: Skin is warm and dry.     Findings: No rash.  Neurological:     Mental Status: She is alert.     Cranial Nerves: No cranial nerve deficit.     Comments: Patient is awake and does not intermittently with acknowledgment that she can hear [place]  Psychiatric:        Behavior: Behavior normal.     ED Results / Procedures / Treatments   Labs (all labs ordered are listed, but only abnormal results are displayed) Labs Reviewed  COMPREHENSIVE METABOLIC PANEL - Abnormal; Notable for the following components:      Result Value   Glucose, Bld 141 (*)    Albumin 2.7 (*)    All other components within normal limits  CBC WITH DIFFERENTIAL/PLATELET - Abnormal; Notable for the following components:   RBC 3.17 (*)    Hemoglobin 10.4 (*)    HCT 32.4 (*)    MCV 102.2 (*)    RDW 17.5 (*)    All other components within normal limits  PROTIME-INR - Abnormal; Notable for the following components:   Prothrombin Time 15.6 (*)    All other components within normal limits  I-STAT CG4 LACTIC ACID, ED - Abnormal; Notable for the following components:   Lactic Acid, Venous 3.4 (*)    All other components within normal limits  I-STAT CG4 LACTIC ACID, ED - Abnormal; Notable for the following components:   Lactic Acid, Venous 3.1 (*)    All other components within normal limits  RESP PANEL BY RT-PCR (RSV, FLU A&B, COVID)  RVPGX2  CULTURE, BLOOD (ROUTINE X 2)  CULTURE, BLOOD (ROUTINE X 2)  APTT  URINALYSIS, W/ REFLEX TO CULTURE (INFECTION SUSPECTED)    EKG EKG Interpretation Date/Time:  Monday May 12 2023 20:43:31 EDT Ventricular Rate:  115 PR Interval:  126 QRS Duration:  72 QT Interval:  310 QTC Calculation: 429 R Axis:   80  Text Interpretation: Sinus tachycardia Nonspecific T abnormalities, lateral leads No significant change since last tracing Confirmed by Gwyneth Sprout ([id number]) on 05/12/2023 9:16:13  PM  Radiology No results found.  Procedures Procedures    Medications Ordered in ED Medications  lactated ringers infusion ( Intravenous New Bag/Given 05/12/23 2222)  vancomycin (VANCOCIN) IVPB 1000 mg/200 mL premix (1,000 mg Intravenous New Bag/Given 05/12/23 2210)  lactated ringers bolus 500 mL (has no administration in time range)  lactated ringers bolus 1,000 mL (0 mLs Intravenous Stopped 05/12/23 2222)  ceFEPIme (MAXIPIME) 2 g in sodium chloride 0.9 % 100 mL IVPB (0 g Intravenous Stopped  05/12/23 2049)  metroNIDAZOLE (FLAGYL) IVPB 500 mg (0 mg Intravenous Stopped 05/12/23 2210)  acetaminophen (TYLENOL) suppository 650 mg (650 mg Rectal Given 05/12/23 2020)  iohexol (OMNIPAQUE) 350 MG/ML injection 75 mL (75 mLs Intravenous Contrast Given 05/12/23 2125)    ED Course/ Medical Decision Making/ A&P                                 Medical Decision Making Amount and/or Complexity of Data Reviewed Independent Historian: EMS External Data Reviewed: notes. Labs: ordered. Decision-making details documented in ED Course. Radiology: ordered and independent interpretation performed. Decision-making details documented in ED Course. ECG/medicine tests: ordered and independent interpretation performed. Decision-making details documented in ED Course.  Risk OTC drugs. Prescription drug management. Decision regarding hospitalization.   Pt with multiple medical problems and comorbidities and presenting today with a complaint that caries a high risk for morbidity and mortality.  Returning only hours after being discharged from the hospital with fever and some shortness of breath.  Patient's temperature is 103.6 upon arrival and sepsis order set was initiated.  Blood pressure was normal but she was tachycardic.  She is satting 99% on 4 L nasal cannula.  Patient is rhonchorous and was recently treated for pneumonia and concern for return pneumonia versus wound infection from the sacrum that is draining  foul green discharge versus flu or UTI.  Patient was given an IV fluid bolus and labs are pending.  Given PR Tylenol.  11:05 PM I independently interpreted patient's labs and EKG.  EKG with sinus tachycardia but no acute changes, initial lactic acid is elevated at 3.4 and second was still elevated at 3.1.  Patient initially had received 1 L of LR but after persistent elevated lactate an extra 500 was added which was a little more than 30/kg and patient continued IV fluids.  Blood pressure remained stable.  CMP without acute findings, CBC with normal white count hemoglobin improved to 10.4 from 8, viral panel is negative.  I have independently visualized and interpreted pt's images today. Chest x-ray without new infiltrates and CT of the pelvis still pending.  Patient was covered with broad-spectrum antibiotics and Tylenol did improve fever.  Tachycardia has improved.  Patient will need admission.         Final Clinical Impression(s) / ED Diagnoses Final diagnoses:  Sepsis without acute organ dysfunction, due to unspecified organism [facility])    Rx / DC Orders ED Discharge Orders     None         Gwyneth Sprout, MD 05/12/23 2311

## 2023-05-12 NOTE — Plan of Care (Signed)
 Problem: Health Behavior/Discharge Planning: Goal: Ability to manage health-related needs will improve 05/12/2023 1255 by Candyce Churn, RN Outcome: Adequate for Discharge 05/12/2023 0930 by Candyce Churn, RN Outcome: Progressing   Problem: Clinical Measurements: Goal: Ability to maintain clinical measurements within normal limits will improve 05/12/2023 1255 by Candyce Churn, RN Outcome: Adequate for Discharge 05/12/2023 0930 by Candyce Churn, RN Outcome: Progressing Goal: Will remain free from infection 05/12/2023 1255 by Candyce Churn, RN Outcome: Adequate for Discharge 05/12/2023 0930 by Candyce Churn, RN Outcome: Progressing Goal: Diagnostic test results will improve 05/12/2023 1255 by Candyce Churn, RN Outcome: Adequate for Discharge 05/12/2023 0930 by Candyce Churn, RN Outcome: Progressing Goal: Respiratory complications will improve 05/12/2023 1255 by Candyce Churn, RN Outcome: Adequate for Discharge 05/12/2023 0930 by Candyce Churn, RN Outcome: Progressing Goal: Cardiovascular complication will be avoided 05/12/2023 1255 by Candyce Churn, RN Outcome: Adequate for Discharge 05/12/2023 0930 by Candyce Churn, RN Outcome: Progressing   Problem: Activity: Goal: Risk for activity intolerance will decrease 05/12/2023 1255 by Candyce Churn, RN Outcome: Adequate for Discharge 05/12/2023 0930 by Candyce Churn, RN Outcome: Progressing   Problem: Nutrition: Goal: Adequate nutrition will be maintained 05/12/2023 1255 by Candyce Churn, RN Outcome: Adequate for Discharge 05/12/2023 0930 by Candyce Churn, RN Outcome: Progressing   Problem: Coping: Goal: Level of anxiety will decrease 05/12/2023 1255 by Candyce Churn, RN Outcome: Adequate for Discharge 05/12/2023 0930 by Candyce Churn, RN Outcome: Progressing   Problem: Elimination: Goal: Will not experience complications related to bowel motility 05/12/2023 1255 by Candyce Churn, RN Outcome: Adequate for  Discharge 05/12/2023 0930 by Candyce Churn, RN Outcome: Progressing Goal: Will not experience complications related to urinary retention 05/12/2023 1255 by Candyce Churn, RN Outcome: Adequate for Discharge 05/12/2023 0930 by Candyce Churn, RN Outcome: Progressing   Problem: Pain Managment: Goal: General experience of comfort will improve and/or be controlled 05/12/2023 1255 by Candyce Churn, RN Outcome: Adequate for Discharge 05/12/2023 0930 by Candyce Churn, RN Outcome: Progressing   Problem: Safety: Goal: Ability to remain free from injury will improve 05/12/2023 1255 by Candyce Churn, RN Outcome: Adequate for Discharge 05/12/2023 0930 by Candyce Churn, RN Outcome: Progressing   Problem: Skin Integrity: Goal: Risk for impaired skin integrity will decrease 05/12/2023 1255 by Candyce Churn, RN Outcome: Adequate for Discharge 05/12/2023 0930 by Candyce Churn, RN Outcome: Progressing   Problem: Clinical Measurements: Goal: Diagnostic test results will improve 05/12/2023 1255 by Candyce Churn, RN Outcome: Adequate for Discharge 05/12/2023 0930 by Candyce Churn, RN Outcome: Progressing Goal: Signs and symptoms of infection will decrease 05/12/2023 1255 by Candyce Churn, RN Outcome: Adequate for Discharge 05/12/2023 0930 by Candyce Churn, RN Outcome: Progressing   Problem: Respiratory: Goal: Ability to maintain adequate ventilation will improve 05/12/2023 1255 by Candyce Churn, RN Outcome: Adequate for Discharge 05/12/2023 0930 by Candyce Churn, RN Outcome: Progressing   Problem: Activity: Goal: Ability to tolerate increased activity will improve 05/12/2023 1255 by Candyce Churn, RN Outcome: Adequate for Discharge 05/12/2023 0930 by Candyce Churn, RN Outcome: Progressing   Problem: Clinical Measurements: Goal: Ability to maintain a body temperature in the normal range will improve 05/12/2023 1255 by Candyce Churn, RN Outcome: Adequate for Discharge 05/12/2023  0930 by Candyce Churn, RN Outcome: Progressing   Problem: Respiratory: Goal: Ability to maintain adequate ventilation will improve  05/12/2023 1255 by Candyce Churn, RN Outcome: Adequate for Discharge 05/12/2023 0930 by Candyce Churn, RN Outcome: Progressing Goal: Ability to maintain a clear airway will improve 05/12/2023 1255 by Candyce Churn, RN Outcome: Adequate for Discharge 05/12/2023 0930 by Candyce Churn, RN Outcome: Progressing

## 2023-05-12 NOTE — TOC Transition Note (Signed)
 Transition of Care [facility]) - Discharge Note   Patient Details  Name: Patricia Davies MRN: [id number] Date of Birth: April 19, 1945  Transition of Care [facility]) CM/SW Contact:  Larrie Kass, LCSW Phone Number: 05/12/2023, 12:32 PM   Clinical Narrative:    Csw spoke with pt's son who stated he is waiting on hospital bed to be delivered today. He state he was told it will be delivered between 12-3pm. cSW spoke with DME company.  12:30pm CSW received a call from [employer], and they confirmed that the pt's hospital bed and all DME have been delivered.  CSW spoke with the pt's son and confirmed the home address: [address], [place], [place]. CSW explained that the pt's home health services are through [facility], and [facility] is following up for outpatient palliative services. Pt's son inquired about the patient's PCP, and CSW provided the contact information. The p's son is requesting EMS transport home. [facility] was called. No further       Final next level of care: Home w Home Health Services Barriers to Discharge: Barriers Resolved   Patient Goals and CMS Choice Patient states their goals for this hospitalization and ongoing recovery are:: son would like pt to retrun home CMS Medicare.gov Compare Post Acute Care list provided to:: Patient Represenative (must comment)        Discharge Placement                Patient to be transferred to facility by: EMS Name of family member notified: [place], [place] (Son)  [phone] [facility]) Patient and family notified of of transfer: 05/12/23  Discharge Plan and Services Additional resources added to the After Visit Summary for     Discharge Planning Services: CM Consult            DME Arranged: Hospital bed DME Agency: [employer] Date DME Agency Contacted: 05/01/23 Time DME Agency Contacted: [phone] Representative spoke with at DME Agency: Vaughan Basta HH Arranged: PT HH Agency: [facility] Date [facility] Agency Contacted:  05/01/23 Time HH Agency Contacted: [phone] Representative spoke with at [facility] Agency: Amy  Social Drivers of Health (SDOH) Interventions SDOH Screenings   Food Insecurity: No Food Insecurity (04/26/2023)  Housing: Low Risk  (04/26/2023)  Transportation Needs: No Transportation Needs (04/26/2023)  Utilities: Not At Risk (04/26/2023)  Alcohol Screen: Low Risk  (12/06/2021)  Depression (PHQ2-9): Low Risk  (12/06/2021)  Financial Resource Strain: Low Risk  (12/06/2021)  Physical Activity: Inactive (12/06/2021)  Social Connections: Unknown (12/06/2021)  Stress: No Stress Concern Present (12/06/2021)  Tobacco Use: Medium Risk (04/24/2023)     Readmission Risk Interventions     No data to display

## 2023-05-12 NOTE — Sepsis Progress Note (Signed)
 Elink following for sepsis protocol.

## 2023-05-12 NOTE — ED Notes (Signed)
 Patient returned from imaging, IV fluids reconnected and patient's temperature and pain reassessed. Patient asked if her pain improved and she said yes. Patient asked if her pain was gone and she said yes.

## 2023-05-12 NOTE — Progress Notes (Signed)
 Pt's brother called wanting to know a time for patient to be discharged stating the whole family is ready for her to be released.  I offered multiple times to have social work or myself return his call when more information is known.  The caller became angry telling me "it's already in concrete she's leaving today, and we are just trying to get a time.  Now I see why my nephew is so angry", and caller hung up on me

## 2023-05-12 NOTE — Care Management Important Message (Signed)
 Important Message  Patient Details IM Letter given. Name: Patricia Davies MRN: [id number] Date of Birth: 03/17/1945   Important Message Given:  Yes - Medicare IM     Caren Macadam 05/12/2023, 11:21 AM

## 2023-05-13 ENCOUNTER — Inpatient Hospital Stay (HOSPITAL_COMMUNITY)

## 2023-05-13 DIAGNOSIS — Z7901 Long term (current) use of anticoagulants: Secondary | ICD-10-CM | POA: Diagnosis not present

## 2023-05-13 DIAGNOSIS — B479 Mycetoma, unspecified: Secondary | ICD-10-CM | POA: Diagnosis present

## 2023-05-13 DIAGNOSIS — I10 Essential (primary) hypertension: Secondary | ICD-10-CM

## 2023-05-13 DIAGNOSIS — Z681 Body mass index (BMI) 19 or less, adult: Secondary | ICD-10-CM | POA: Diagnosis not present

## 2023-05-13 DIAGNOSIS — A419 Sepsis, unspecified organism: Secondary | ICD-10-CM | POA: Diagnosis present

## 2023-05-13 DIAGNOSIS — L89150 Pressure ulcer of sacral region, unstageable: Secondary | ICD-10-CM | POA: Diagnosis present

## 2023-05-13 DIAGNOSIS — R911 Solitary pulmonary nodule: Secondary | ICD-10-CM | POA: Diagnosis not present

## 2023-05-13 DIAGNOSIS — D869 Sarcoidosis, unspecified: Secondary | ICD-10-CM | POA: Diagnosis present

## 2023-05-13 DIAGNOSIS — J449 Chronic obstructive pulmonary disease, unspecified: Secondary | ICD-10-CM | POA: Diagnosis not present

## 2023-05-13 DIAGNOSIS — R933 Abnormal findings on diagnostic imaging of other parts of digestive tract: Secondary | ICD-10-CM | POA: Diagnosis not present

## 2023-05-13 DIAGNOSIS — L89896 Pressure-induced deep tissue damage of other site: Secondary | ICD-10-CM | POA: Diagnosis present

## 2023-05-13 DIAGNOSIS — J439 Emphysema, unspecified: Secondary | ICD-10-CM | POA: Diagnosis present

## 2023-05-13 DIAGNOSIS — E872 Acidosis, unspecified: Secondary | ICD-10-CM | POA: Diagnosis present

## 2023-05-13 DIAGNOSIS — Z1152 Encounter for screening for COVID-19: Secondary | ICD-10-CM | POA: Diagnosis not present

## 2023-05-13 DIAGNOSIS — E785 Hyperlipidemia, unspecified: Secondary | ICD-10-CM | POA: Diagnosis not present

## 2023-05-13 DIAGNOSIS — E876 Hypokalemia: Secondary | ICD-10-CM | POA: Diagnosis not present

## 2023-05-13 DIAGNOSIS — K5900 Constipation, unspecified: Secondary | ICD-10-CM | POA: Diagnosis present

## 2023-05-13 DIAGNOSIS — F0390 Unspecified dementia without behavioral disturbance: Secondary | ICD-10-CM | POA: Diagnosis not present

## 2023-05-13 DIAGNOSIS — I82501 Chronic embolism and thrombosis of unspecified deep veins of right lower extremity: Secondary | ICD-10-CM | POA: Diagnosis present

## 2023-05-13 DIAGNOSIS — J8410 Pulmonary fibrosis, unspecified: Secondary | ICD-10-CM | POA: Diagnosis not present

## 2023-05-13 DIAGNOSIS — L89154 Pressure ulcer of sacral region, stage 4: Secondary | ICD-10-CM | POA: Diagnosis present

## 2023-05-13 DIAGNOSIS — L89210 Pressure ulcer of right hip, unstageable: Secondary | ICD-10-CM | POA: Diagnosis present

## 2023-05-13 DIAGNOSIS — R4189 Other symptoms and signs involving cognitive functions and awareness: Secondary | ICD-10-CM | POA: Diagnosis not present

## 2023-05-13 DIAGNOSIS — Z8249 Family history of ischemic heart disease and other diseases of the circulatory system: Secondary | ICD-10-CM | POA: Diagnosis not present

## 2023-05-13 DIAGNOSIS — R269 Unspecified abnormalities of gait and mobility: Secondary | ICD-10-CM | POA: Diagnosis not present

## 2023-05-13 DIAGNOSIS — Z66 Do not resuscitate: Secondary | ICD-10-CM | POA: Diagnosis present

## 2023-05-13 DIAGNOSIS — D649 Anemia, unspecified: Secondary | ICD-10-CM | POA: Diagnosis not present

## 2023-05-13 DIAGNOSIS — L89616 Pressure-induced deep tissue damage of right heel: Secondary | ICD-10-CM | POA: Diagnosis present

## 2023-05-13 DIAGNOSIS — Z833 Family history of diabetes mellitus: Secondary | ICD-10-CM | POA: Diagnosis not present

## 2023-05-13 DIAGNOSIS — R627 Adult failure to thrive: Secondary | ICD-10-CM

## 2023-05-13 DIAGNOSIS — F03918 Unspecified dementia, unspecified severity, with other behavioral disturbance: Secondary | ICD-10-CM

## 2023-05-13 DIAGNOSIS — F03C18 Unspecified dementia, severe, with other behavioral disturbance: Secondary | ICD-10-CM | POA: Diagnosis present

## 2023-05-13 DIAGNOSIS — J9690 Respiratory failure, unspecified, unspecified whether with hypoxia or hypercapnia: Secondary | ICD-10-CM | POA: Diagnosis not present

## 2023-05-13 DIAGNOSIS — J9601 Acute respiratory failure with hypoxia: Secondary | ICD-10-CM | POA: Diagnosis present

## 2023-05-13 DIAGNOSIS — E440 Moderate protein-calorie malnutrition: Secondary | ICD-10-CM | POA: Diagnosis present

## 2023-05-13 DIAGNOSIS — J90 Pleural effusion, not elsewhere classified: Secondary | ICD-10-CM | POA: Diagnosis not present

## 2023-05-13 DIAGNOSIS — J690 Pneumonitis due to inhalation of food and vomit: Secondary | ICD-10-CM | POA: Diagnosis present

## 2023-05-13 DIAGNOSIS — E43 Unspecified severe protein-calorie malnutrition: Secondary | ICD-10-CM | POA: Diagnosis not present

## 2023-05-13 LAB — URINALYSIS, W/ REFLEX TO CULTURE (INFECTION SUSPECTED)
Bilirubin Urine: NEGATIVE
Glucose, UA: 50 mg/dL — AB
Ketones, ur: NEGATIVE mg/dL
Leukocytes,Ua: NEGATIVE
Nitrite: NEGATIVE
Protein, ur: NEGATIVE mg/dL
Specific Gravity, Urine: 1.023 (ref 1.005–1.030)
pH: 7 (ref 5.0–8.0)

## 2023-05-13 LAB — CBC WITH DIFFERENTIAL/PLATELET
Abs Immature Granulocytes: 0.04 10*3/uL (ref 0.00–0.07)
Basophils Absolute: 0 10*3/uL (ref 0.0–0.1)
Basophils Relative: 0 %
Eosinophils Absolute: 0 10*3/uL (ref 0.0–0.5)
Eosinophils Relative: 0 %
HCT: 25.7 % — ABNORMAL LOW (ref 36.0–46.0)
Hemoglobin: 8.2 g/dL — ABNORMAL LOW (ref 12.0–15.0)
Immature Granulocytes: 1 %
Lymphocytes Relative: 17 %
Lymphs Abs: 0.9 10*3/uL (ref 0.7–4.0)
MCH: 32.7 pg (ref 26.0–34.0)
MCHC: 31.9 g/dL (ref 30.0–36.0)
MCV: 102.4 fL — ABNORMAL HIGH (ref 80.0–100.0)
Monocytes Absolute: 0.8 10*3/uL (ref 0.1–1.0)
Monocytes Relative: 14 %
Neutro Abs: 3.6 10*3/uL (ref 1.7–7.7)
Neutrophils Relative %: 68 %
Platelets: 251 10*3/uL (ref 150–400)
RBC: 2.51 MIL/uL — ABNORMAL LOW (ref 3.87–5.11)
RDW: 17.5 % — ABNORMAL HIGH (ref 11.5–15.5)
WBC: 5.4 10*3/uL (ref 4.0–10.5)
nRBC: 0 % (ref 0.0–0.2)

## 2023-05-13 LAB — BASIC METABOLIC PANEL
Anion gap: 3 — ABNORMAL LOW (ref 5–15)
BUN: 9 mg/dL (ref 8–23)
CO2: 27 mmol/L (ref 22–32)
Calcium: 7.9 mg/dL — ABNORMAL LOW (ref 8.9–10.3)
Chloride: 103 mmol/L (ref 98–111)
Creatinine, Ser: 0.42 mg/dL — ABNORMAL LOW (ref 0.44–1.00)
GFR, Estimated: >60 mL/min (ref >60)
Glucose, Bld: 111 mg/dL — ABNORMAL HIGH (ref 70–99)
Potassium: 4.1 mmol/L (ref 3.5–5.1)
Sodium: 133 mmol/L — ABNORMAL LOW (ref 135–145)

## 2023-05-13 MED ORDER — SODIUM CHLORIDE 0.9 % IV SOLN: INTRAVENOUS | Status: DC

## 2023-05-13 MED ORDER — APIXABAN 5 MG PO TABS
5.0000 mg | ORAL_TABLET | Freq: Two times a day (BID) | ORAL | Status: DC
  Administered 2023-05-13 – 2023-05-23 (×21): 5 mg via ORAL
  Filled 2023-05-13 (×21): qty 1

## 2023-05-13 MED ORDER — ACETAMINOPHEN 650 MG RE SUPP: 650.0000 mg | Freq: Four times a day (QID) | RECTAL | Status: DC | PRN

## 2023-05-13 MED ORDER — VANCOMYCIN HCL IN DEXTROSE 1-5 GM/200ML-% IV SOLN
1000.0000 mg | INTRAVENOUS | Status: DC
  Administered 2023-05-14: 1000 mg via INTRAVENOUS
  Filled 2023-05-13: qty 200

## 2023-05-13 MED ORDER — POLYETHYLENE GLYCOL 3350 17 G PO PACK
17.0000 g | PACK | Freq: Two times a day (BID) | ORAL | Status: DC
  Administered 2023-05-13 – 2023-05-23 (×10): 17 g via ORAL
  Filled 2023-05-13 (×11): qty 1

## 2023-05-13 MED ORDER — SENNOSIDES-DOCUSATE SODIUM 8.6-50 MG PO TABS: 1.0000 | ORAL_TABLET | Freq: Every evening | ORAL | Status: DC | PRN

## 2023-05-13 MED ORDER — FOLIC ACID 1 MG PO TABS
1.0000 mg | ORAL_TABLET | Freq: Every day | ORAL | Status: DC
  Administered 2023-05-13 – 2023-05-23 (×11): 1 mg via ORAL
  Filled 2023-05-13 (×11): qty 1

## 2023-05-13 MED ORDER — IOHEXOL 350 MG/ML SOLN
50.0000 mL | Freq: Once | INTRAVENOUS | Status: AC | PRN
  Administered 2023-05-13: 50 mL via INTRAVENOUS

## 2023-05-13 MED ORDER — LACTATED RINGERS IV BOLUS
1000.0000 mL | Freq: Once | INTRAVENOUS | Status: AC
  Administered 2023-05-13: 1000 mL via INTRAVENOUS

## 2023-05-13 MED ORDER — ACETAMINOPHEN 325 MG PO TABS: 650.0000 mg | ORAL_TABLET | Freq: Four times a day (QID) | ORAL | Status: DC | PRN

## 2023-05-13 MED ORDER — MEDIHONEY WOUND/BURN DRESSING EX PSTE
1.0000 | PASTE | Freq: Every day | CUTANEOUS | Status: DC
  Administered 2023-05-13 – 2023-05-23 (×11): 1 via TOPICAL
  Filled 2023-05-13 (×2): qty 44

## 2023-05-13 MED ORDER — BISACODYL 10 MG RE SUPP
10.0000 mg | Freq: Once | RECTAL | Status: AC
  Administered 2023-05-13: 10 mg via RECTAL
  Filled 2023-05-13: qty 1

## 2023-05-13 MED ORDER — SODIUM CHLORIDE 0.9 % IV SOLN
2.0000 g | Freq: Two times a day (BID) | INTRAVENOUS | Status: DC
  Administered 2023-05-13 – 2023-05-15 (×5): 2 g via INTRAVENOUS
  Filled 2023-05-13 (×5): qty 12.5

## 2023-05-13 NOTE — ED Notes (Signed)
 Dressing on patient sacral area changed and applied medihoney

## 2023-05-13 NOTE — ED Notes (Signed)
 Pt unable to void at this time.  KM

## 2023-05-13 NOTE — Progress Notes (Signed)
 Patient admitted after midnight, please see H&P.  Just d/c'd from [facility] after a > 2 week stay. Was seen by palliative care during that hospitalization.    A/P:  Sepsis unclear etiology  -Acute respiratory failure with hypoxia, patient not on home oxygen -Concern for pneumonia based on chest x-ray, CT chest pending read -Concern for cellulitis, decubitus ulcers, Surgery consulted-- they will follow  -Blood cultures x 2 collected.   -Wound care consult placed. -IV cefepime and vancomycin continued -Tylenol as needed fever -Respiratory panel negative     8 cm rectal stool ball, constipation -MiraLAX twice daily, Dulcolax suppository -Given patient's many sacral and hip decub.  Will avoid  diarrhea   RLE DVT -Continue Eliquis   Failure to thrive in adult - Protein-calorie malnutrition, moderate (HCC)  Marlin Canary DO

## 2023-05-13 NOTE — Progress Notes (Deleted)
 PCP:   [name], MD   Chief Complaint:  Fever, weakness  HPI: This is a 78 year old female with past medical history significant for severe dementia, sacral decubitus, FTT, sarcoidosis, COPD, HTN, RLE DVT.  She was recently admitted 2/20 - 05/12/2023 with sepsis secondary to pneumonia.  Patient treated and discharged yesterday.  Since being discharged patient has been lethargic, generalized weakness, febrile with temperatures up to 102 at home.  She is brought back to the ER.  In the ER patient's Tmax 103.6.  3 L nasal cannula.  Lactic acid 3.4 => 3.1.  Blood cultures x 2 collected CT Pelvis: Midline lower back and gluteal subcutaneus soft tissue edema and emphysema. Gas extends to the soft tissues along the sacrum. A necrotizing fasciitis cannot be excluded as this is a clinical diagnosis Constipation. 8 cm rectal stool ball.  CXR: Largely chronic lung disease with biapical pleuroparenchymal scarring and areas of distortion and fibrosis. Left apical density slightly progressive compared to more remote exams and could be secondary to progressive scarring, superimposed infection or developing mass and chest CT follow-up was previously recommended.  IV antibiotics cefepime and vancomycin given.  EDP contacted Dr Hillery Hunter who will see patient.    Review of Systems:  Per HPI  Past Medical History: Past Medical History:  Diagnosis Date   COPD (chronic obstructive pulmonary disease) (HCC)    History of nephrolithiasis    Hypertension    Sarcoidosis of skin    Vitamin B12 deficiency    Past Surgical History:  Procedure Laterality Date   ABDOMINAL HYSTERECTOMY     CHOLECYSTECTOMY      Medications: Prior to Admission medications   Medication Sig Start Date End Date Taking? Authorizing Provider  acetaminophen (TYLENOL) 325 MG tablet Take 650 mg by mouth every 6 (six) hours as needed.    [provider]  APIXABAN Everlene Balls) VTE STARTER PACK (10MG  AND 5MG ) Take as directed  on package: start with two-5mg  tablets twice daily for 7 days. On day 8, switch to one-5mg  tablet twice daily. 05/06/23   Briant Cedar, MD  feeding supplement (ENSURE ENLIVE / ENSURE PLUS) LIQD Take 237 mLs by mouth 2 (two) times daily between meals. 05/06/23   Briant Cedar, MD  folic acid (FOLVITE) 1 MG tablet Take 1 tablet (1 mg total) by mouth daily. 05/06/23 06/11/23  Briant Cedar, MD  leptospermum manuka honey (MEDIHONEY) PSTE paste Apply 1 Application topically at bedtime. 05/12/23   Deanna Artis, DO  memantine (NAMENDA) 10 MG tablet Take 1 tablet (10 mg at night) for 2 weeks, then increase to 1 tablet (10 mg) twice a day Patient taking differently: Take 10 mg by mouth See admin instructions. Take 1 tablet (10 mg at night) for 2 weeks, then increase to 1 tablet (10 mg) twice a day 09/07/21   Marcos Eke, PA-C  polyethylene glycol powder (GLYCOLAX/MIRALAX) 17 GM/SCOOP powder Take 17 g by mouth daily. 05/06/23   Briant Cedar, MD  traMADol (ULTRAM) 50 MG tablet Take 1 tablet (50 mg total) by mouth every 12 (twelve) hours as needed for up to 7 days for moderate pain (pain score 4-6). 05/06/23 05/19/23  Briant Cedar, MD    Allergies:  No Known Allergies  Social History:  reports that she quit smoking about 33 years ago. Her smoking use included cigarettes. She started smoking about 53 years ago. She has a 10 pack-year smoking history. She has never used smokeless tobacco. She reports  that she does not currently use alcohol after a past usage of about 1.0 standard drink of alcohol per week. She reports that she does not use drugs.  Family History: Family History  Problem Relation Age of Onset   Hypertension Maternal Aunt    Diabetes Maternal Aunt     Physical Exam: Vitals:   05/12/23 2319 05/13/23 0000 05/13/23 0017 05/13/23 0019  BP:  124/67 122/77   Pulse:      Resp: 16 16  17   Temp:      TempSrc:      SpO2:        General: Alert, slender, no acute  distress Eyes: Pink conjunctiva, no scleral icterus ENT: Moist oral mucosa, neck supple, no thyromegaly Lungs: CTA B/L, no wheeze, no crackles, no use of accessory muscles Cardiovascular: RRR, no murmurs, no JVD Abdomen: soft, positive BS, NTND, not an acute abdomen GU: not examined Neuro: CN II - XII appears grossly intact Musculoskeletal: Moves all extremities Skin: Large wound bilateral hips.  No drainage.  No fluctuance.  No erythema.  Sacral decub Psych: appropriate patient   Labs on Admission:  Recent Labs    05/11/23 0451 05/12/23 2001  NA 142 137  K 4.1 4.3  CL 106 99  CO2 28 29  GLUCOSE 91 141*  BUN 16 15  CREATININE <0.30* 0.63  CALCIUM 8.5* 9.0  MG 1.8  --    Recent Labs    05/12/23 2001  AST 30  ALT 23  ALKPHOS 51  BILITOT 0.5  PROT 7.0  ALBUMIN 2.7*    Recent Labs    05/11/23 0451 05/12/23 2001  WBC 5.5 4.4  NEUTROABS  --  3.3  HGB 7.5* 10.4*  HCT 24.4* 32.4*  MCV 103.4* 102.2*  PLT 278 328    Micro Results: Recent Results (from the past 240 hours)  Resp panel by RT-PCR (RSV, Flu A&B, Covid) Anterior Nasal Swab     Status: None   Collection Time: 05/12/23  8:01 PM   Specimen: Anterior Nasal Swab  Result Value Ref Range Status   SARS Coronavirus 2 by RT PCR NEGATIVE NEGATIVE Final   Influenza A by PCR NEGATIVE NEGATIVE Final   Influenza B by PCR NEGATIVE NEGATIVE Final    Comment: (NOTE) The Xpert Xpress SARS-CoV-2/FLU/RSV plus assay is intended as an aid in the diagnosis of influenza from Nasopharyngeal swab specimens and should not be used as a sole basis for treatment. Nasal washings and aspirates are unacceptable for Xpert Xpress SARS-CoV-2/FLU/RSV testing.  Fact Sheet for Patients: BloggerCourse.com  Fact Sheet for Healthcare Providers: SeriousBroker.it  This test is not yet approved or cleared by the [place] FDA and has been authorized for detection and/or diagnosis of  SARS-CoV-2 by FDA under an Emergency Use Authorization (EUA). This EUA will remain in effect (meaning this test can be used) for the duration of the COVID-19 declaration under Section 564(b)(1) of the Act, 21 U.S.C. section 360bbb-3(b)(1), unless the authorization is terminated or revoked.     Resp Syncytial Virus by PCR NEGATIVE NEGATIVE Final    Comment: (NOTE) Fact Sheet for Patients: BloggerCourse.com  Fact Sheet for Healthcare Providers: SeriousBroker.it  This test is not yet approved or cleared by the [place] FDA and has been authorized for detection and/or diagnosis of SARS-CoV-2 by FDA under an Emergency Use Authorization (EUA). This EUA will remain in effect (meaning this test can be used) for the duration of the COVID-19 declaration under Section 564(b)(1) of the  Act, 21 U.S.C. section 360bbb-3(b)(1), unless the authorization is terminated or revoked.  Performed at [facility], [address], [place], [place] [address]      Radiological Exams on Admission: CT PELVIS W CONTRAST Result Date: 05/12/2023 CLINICAL DATA:  Soft tissue infection suspected, pelvis, xray done EXAM: CT PELVIS WITH CONTRAST TECHNIQUE: Multidetector CT imaging of the pelvis was performed using the standard protocol following the bolus administration of intravenous contrast. RADIATION DOSE REDUCTION: This exam was performed according to the departmental dose-optimization program which includes automated exposure control, adjustment of the mA and/or kV according to patient size and/or use of iterative reconstruction technique. CONTRAST:  75mL OMNIPAQUE IOHEXOL 350 MG/ML SOLN COMPARISON:  None Available. FINDINGS: Urinary Tract:  No abnormality visualized. Bowel:  Stool throughout the majority of the visualized colon. Vascular/Lymphatic: Atherosclerotic plaque. No pathologically enlarged lymph nodes. No significant vascular abnormality seen.  Reproductive:  No mass or other significant abnormality Other: No intraperitoneal free fluid. No intraperitoneal free gas. No organized fluid collection. Musculoskeletal: Midline lower back and gluteal subcutaneus soft tissue edema and emphysema. Gas extends to the soft tissues along the sacrum. Right femoral head avascular necrosis. No acute displaced fracture. No acute displaced fracture or dislocation of the bilateral hips. No acute displaced fracture or diastasis of the bones of the pelvis. No cortical erosion or destruction. Grade 2 anterolisthesis of L4 on L5. Degenerative changes of visualized lower lumbar spine. IMPRESSION: 1. Midline lower back and gluteal subcutaneus soft tissue edema and emphysema. Gas extends to the soft tissues along the sacrum. A necrotizing fasciitis cannot be excluded as this is a clinical diagnosis. 2. No CT evidence of osteomyelitis. 3. Constipation.  8 cm rectal stool ball. 4. Grade 2 anterolisthesis of L4 on L5. 5.  Aortic Atherosclerosis (ICD10-I70.0). Electronically Signed   By: Tish Frederickson M.D.   On: 05/12/2023 23:38   DG Chest Port 1 View Result Date: 05/12/2023 CLINICAL DATA:  Possible sepsis EXAM: PORTABLE CHEST 1 VIEW COMPARISON:  05/02/2023, 07/22/2019 FINDINGS: Chronic pleural and parenchymal scarring at the left greater than right apex with elevation the hila and areas of architectural distortion. No acute airspace disease or effusion. Stable cardiomediastinal silhouette with aortic atherosclerosis. Left apical progressive airspace disease compared to more remote exams. IMPRESSION: 1. No active disease. 2. Largely chronic lung disease with biapical pleuroparenchymal scarring and areas of distortion and fibrosis. Left apical density slightly progressive compared to more remote exams and could be secondary to progressive scarring, superimposed infection or developing mass and chest CT follow-up was previously recommended. Electronically Signed   By: Jasmine Pang  M.D.   On: 05/12/2023 23:05    Assessment/Plan Present on Admission:  Sepsis unclear etiology //  Lactic acidosis -Acute respiratory failure with hypoxia, patient not on home oxygen -Concern for pneumonia based on chest x-ray, CT chest with contrast ordered -Concern for cellulitis, decubitus ulcers, Surgery consulted -Blood cultures x 2 collected.  Wound care consult placed. -Surgery saw and evaluated patient.  Does not believe the patient has necrotizing fasciitis.  Surgery's input appreciated -Wound care consult placed.  Surgery recommends Medihoney to the sacrum and cover with a dressing. Change BID or when soiled.  -IV cefepime and vancomycin continued -IV fluid hydration.  Follow lactic acid curve -Tylenol as needed fever -Respiratory panel ordered -Optimize nutrition and avoid pressure to the area as able -Nebulizes as needed every 2 hours as needed.   8 cm rectal stool ball, constipation -MiraLAX twice daily, Dulcolax suppository -Given patient's  many sacral and hip decub.  Will avoid oral diarrhea   RLE DVT -Continue Eliquis   Failure to thrive in adult  Protein-calorie malnutrition, moderate (HCC) -Add Ensure   h/o Sarcoidosis  COPD mixed type (HCC)  Dementia with behavioral disturbance (HCC) -  Dayra Rapley 05/13/2023, 12:39 AM

## 2023-05-13 NOTE — Evaluation (Signed)
 Clinical/Bedside Swallow Evaluation Patient Details  Name: NICHOLA CIESLINSKI MRN: [id number] Date of Birth: 1946-03-01  Today's Date: 05/13/2023 Time: SLP Start Time (ACUTE ONLY): 1650 SLP Stop Time (ACUTE ONLY): 1715 SLP Time Calculation (min) (ACUTE ONLY): 25 min  Past Medical History:  Past Medical History:  Diagnosis Date   COPD (chronic obstructive pulmonary disease) (HCC)    History of nephrolithiasis    Hypertension    Sarcoidosis of skin    Vitamin B12 deficiency    Past Surgical History:  Past Surgical History:  Procedure Laterality Date   ABDOMINAL HYSTERECTOMY     CHOLECYSTECTOMY     HPI:  Patient is a 78 y.o. female who was recently admitted at [facility] (04/24/23) with very poor PO intake, was obtunded, hypotensive and tachycardic. PMH: advanced dementia (lives at home with daughter), sarcoidosis, FTT, COPD, HTN, kidney stones, B12 deficiency. She presented to [facility] on 05/13/23 with acute respiratory failure with hypoxia, concern for PNA based on CXR, CT chest pending, concern for cellulitis, decubitus ulcers (surgery consulted).    Assessment / Plan / Recommendation  Clinical Impression  Patient is not currently presenting with clinical s/s of dysphagia as per this bedside swallow evaluation. When SLP in room, patient's dinner meal tray arrived. SLP provided setup assistance but patient able to manage cup, taking straw sips of thin liquids. She had difficulty managing utensils and opted to mainly pick up food and feed herself. Mastication was prolonged but she is edentulous and only minimal amount of PO residuals in oral cavity after initial swallows. One incident of congested sounding but non-productive cough occured during PO intake. SLP informed RN of patient's PO intake (she had eaten approximately 30% of meal tray and still actively feeding self when SLP left room) and recommendation of soft finger foods to maximize patient's PO intake and self-feeding ability. SLP not recommending  further skilled intervention at this time. SLP Visit Diagnosis: Dysphagia, unspecified (R13.10)    Aspiration Risk  Mild aspiration risk    Diet Recommendation Other (Comment);Dysphagia 3 (Mech soft) (soft solid finger foods, thin liquids)    Liquid Administration via: Cup;Straw Medication Administration: Other (Comment) (as tolerated) Supervision: Patient able to self feed;Intermittent supervision to cue for compensatory strategies Compensations: Slow rate;Small sips/bites Postural Changes: Seated upright at 90 degrees    Other  Recommendations Oral Care Recommendations: Oral care BID;Staff/trained caregiver to provide oral care    Recommendations for follow up therapy are one component of a multi-disciplinary discharge planning process, led by the attending physician.  Recommendations may be updated based on patient status, additional functional criteria and insurance authorization.  Follow up Recommendations No SLP follow up      Assistance Recommended at Discharge    Functional Status Assessment Patient has not had a recent decline in their functional status  Frequency and Duration   N/A         Prognosis   N/A     Swallow Study   General Date of Onset: 05/13/23 HPI: Patient is a 78 y.o. female who was recently admitted at [facility] (04/24/23) with very poor PO intake, was obtunded, hypotensive and tachycardic. PMH: advanced dementia (lives at home with daughter), sarcoidosis, FTT, COPD, HTN, kidney stones, B12 deficiency. She presented to [facility] on 05/13/23 with acute respiratory failure with hypoxia, concern for PNA based on CXR, CT chest pending, concern for cellulitis, decubitus ulcers (surgery consulted). Type of Study: Bedside Swallow Evaluation Previous Swallow Assessment: 04/27/23 BSE during recent past admission Diet Prior to this  Study: Dysphagia 3 (mechanical soft);Thin liquids (Level 0) Temperature Spikes Noted: No Respiratory Status: Room air History of Recent  Intubation: No Behavior/Cognition: Alert;Cooperative;Pleasant mood Oral Cavity Assessment: Within Functional Limits Oral Care Completed by SLP: No Oral Cavity - Dentition: Edentulous Vision: Functional for self-feeding Self-Feeding Abilities: Able to feed self;Needs assist Patient Positioning: Upright in bed Baseline Vocal Quality: Normal Volitional Cough: Cognitively unable to elicit Volitional Swallow: Unable to elicit    Oral/Motor/Sensory Function Overall Oral Motor/Sensory Function: Within functional limits   Ice Chips     Thin Liquid Thin Liquid: Within functional limits Presentation: Straw    Nectar Thick     Honey Thick     Puree Puree: Not tested   Solid     Solid: Within functional limits Presentation: Self Fed      Angela Nevin, MA, CCC-SLP Speech Therapy

## 2023-05-13 NOTE — ED Notes (Signed)
 Patient has been changed, dressing to sacral area, and right and left buttocks has been changed as well, unstageable pressures noted. Patient has been repositioned as well.

## 2023-05-13 NOTE — Progress Notes (Signed)
 Pharmacy Antibiotic Note  Patricia Davies is a 78 y.o. female admitted on 05/12/2023 with wound infection.  Pharmacy has been consulted for cefepime and vancomycin dosing.  Plan: Cefepime 2g q12h  Vancomycin 1000mg  q36h (eAUC 518, Scr used 0.8)  F/u renal function, infectious work up and length of therapy Vancomycin levels as needed    Temp (24hrs), Avg:100 F (37.8 C), Min:97.8 F (36.6 C), Max:103.6 F (39.8 C)  Recent Labs  Lab 05/06/23 0457 05/07/23 0745 05/08/23 0441 05/11/23 0451 05/12/23 2001 05/12/23 2015 05/12/23 2253  WBC 11.2* 8.4 7.0 5.5 4.4  --   --   CREATININE 0.46 0.41* 0.37* <0.30* 0.63  --   --   LATICACIDVEN  --   --   --   --   --  3.4* 3.1*    Estimated Creatinine Clearance: 42.2 mL/min (by C-G formula based on SCr of 0.63 mg/dL).    No Known Allergies  Thank you for allowing pharmacy to be a part of this patient's care.  Marja Kays 05/13/2023 4:04 AM

## 2023-05-13 NOTE — Consult Note (Signed)
 Patricia Davies [birth year]  [id number].    Requesting MD: Rhae Hammock Chief Complaint/Reason for Consult: R/O Nec Fasc  HPI:  78 y/o F w/ a hx of advanced dementia, sarcoidosis, COPD, HTN, and kidney stones who was admitted 2/20-3/10 for PNA and then re-presented to the ED today with concern for AMS.  When EMS arrived she was noted to be hypoxic to the 60s and was given a breathing treatment and stared on oxygen.  She arrived to the ED in stable condition.  Tmax 104, HR 110s, MAP 90s.  Labs were notable for a lactate of 3.4, Na 137, WBC 4.  CXR showed signs of chronic disease.  CT Pelvis was performed and showed some subcutaneous air along the sacrum without evidence of an abscess as well as significant stool burden without evidence of stercoral colitis.   The patient was started on antibiotics and fluids.  Her mental status improved with resuscitation. On exam, she is resting in bed. NAD.    ROS: Review of Systems  Constitutional:  Positive for fever.  HENT: Negative.    Eyes: Negative.   Respiratory: Negative.    Cardiovascular: Negative.   Gastrointestinal: Negative.   Genitourinary: Negative.   Musculoskeletal: Negative.   Skin: Negative.   Neurological:  Positive for weakness.  Endo/Heme/Allergies: Negative.   Psychiatric/Behavioral:  Positive for memory loss.     Family History  Problem Relation Age of Onset   Hypertension Maternal Aunt    Diabetes Maternal Aunt     Past Medical History:  Diagnosis Date   COPD (chronic obstructive pulmonary disease) (HCC)    History of nephrolithiasis    Hypertension    Sarcoidosis of skin    Vitamin B12 deficiency     Past Surgical History:  Procedure Laterality Date   ABDOMINAL HYSTERECTOMY     CHOLECYSTECTOMY      Social History:  reports that she quit smoking about 33 years ago. Her smoking use included cigarettes. She started smoking about 53 years ago. She has a 10 pack-year smoking history. She has never used smokeless  tobacco. She reports that she does not currently use alcohol after a past usage of about 1.0 standard drink of alcohol per week. She reports that she does not use drugs.  Allergies: No Known Allergies  (Not in a hospital admission)   Physical Exam: Blood pressure 113/63, pulse (!) 110, temperature 100.1 F (37.8 C), temperature source Oral, resp. rate 16, SpO2 100%. Gen: elderly female, NAD LE: Bilateral hips with chronic appearing wounds, no crepitus, no fluctuance Gluteal: chronic appearing wounds with eschar and fibrinous exudate with some purulent drainage, no large area of fluctuance appreciate, no crepitus, minimal erythema.   Results for orders placed or performed during the hospital encounter of 05/12/23 (from the past 48 hours)  Comprehensive metabolic panel     Status: Abnormal   Collection Time: 05/12/23  8:01 PM  Result Value Ref Range   Sodium 137 135 - 145 mmol/L   Potassium 4.3 3.5 - 5.1 mmol/L   Chloride 99 98 - 111 mmol/L   CO2 29 22 - 32 mmol/L   Glucose, Bld 141 (H) 70 - 99 mg/dL    Comment: Glucose reference range applies only to samples taken after fasting for at least 8 hours.   BUN 15 8 - 23 mg/dL   Creatinine, Ser 8.46 0.44 - 1.00 mg/dL   Calcium 9.0 8.9 - 96.2 mg/dL   Total Protein 7.0 6.5 - 8.1 g/dL  Albumin 2.7 (L) 3.5 - 5.0 g/dL   AST 30 15 - 41 U/L   ALT 23 0 - 44 U/L   Alkaline Phosphatase 51 38 - 126 U/L   Total Bilirubin 0.5 0.0 - 1.2 mg/dL   GFR, Estimated >44 >81 mL/min    Comment: (NOTE) Calculated using the CKD-EPI Creatinine Equation (2021)    Anion gap 9 5 - 15    Comment: Performed at [facility], [address] [place], [place] [address]  CBC with Differential     Status: Abnormal   Collection Time: 05/12/23  8:01 PM  Result Value Ref Range   WBC 4.4 4.0 - 10.5 K/uL   RBC 3.17 (L) 3.87 - 5.11 MIL/uL   Hemoglobin 10.4 (L) 12.0 - 15.0 g/dL   HCT 49.7 (L) 02.6 - 37.8 %   MCV 102.2 (H) 80.0 - 100.0 fL   MCH 32.8 26.0 - 34.0  pg   MCHC 32.1 30.0 - 36.0 g/dL   RDW 58.8 (H) 50.2 - 77.4 %   Platelets 328 150 - 400 K/uL   nRBC 0.0 0.0 - 0.2 %   Neutrophils Relative % 74 %   Neutro Abs 3.3 1.7 - 7.7 K/uL   Lymphocytes Relative 15 %   Lymphs Abs 0.7 0.7 - 4.0 K/uL   Monocytes Relative 9 %   Monocytes Absolute 0.4 0.1 - 1.0 K/uL   Eosinophils Relative 1 %   Eosinophils Absolute 0.0 0.0 - 0.5 K/uL   Basophils Relative 0 %   Basophils Absolute 0.0 0.0 - 0.1 K/uL   Immature Granulocytes 1 %   Abs Immature Granulocytes 0.02 0.00 - 0.07 K/uL    Comment: Performed at [facility], [address], [place], [place] [address]  Protime-INR     Status: Abnormal   Collection Time: 05/12/23  8:01 PM  Result Value Ref Range   Prothrombin Time 15.6 (H) 11.4 - 15.2 seconds   INR 1.2 0.8 - 1.2    Comment: (NOTE) INR goal varies based on device and disease states. Performed at [facility], [address], [place], [place] [address]   Resp panel by RT-PCR (RSV, Flu A&B, Covid) Anterior Nasal Swab     Status: None   Collection Time: 05/12/23  8:01 PM   Specimen: Anterior Nasal Swab  Result Value Ref Range   SARS Coronavirus 2 by RT PCR NEGATIVE NEGATIVE   Influenza A by PCR NEGATIVE NEGATIVE   Influenza B by PCR NEGATIVE NEGATIVE    Comment: (NOTE) The Xpert Xpress SARS-CoV-2/FLU/RSV plus assay is intended as an aid in the diagnosis of influenza from Nasopharyngeal swab specimens and should not be used as a sole basis for treatment. Nasal washings and aspirates are unacceptable for Xpert Xpress SARS-CoV-2/FLU/RSV testing.  Fact Sheet for Patients: BloggerCourse.com  Fact Sheet for Healthcare Providers: SeriousBroker.it  This test is not yet approved or cleared by the [place] FDA and has been authorized for detection and/or diagnosis of SARS-CoV-2 by FDA under an Emergency Use Authorization (EUA). This EUA will remain in effect (meaning this test  can be used) for the duration of the COVID-19 declaration under Section 564(b)(1) of the Act, 21 U.S.C. section 360bbb-3(b)(1), unless the authorization is terminated or revoked.     Resp Syncytial Virus by PCR NEGATIVE NEGATIVE    Comment: (NOTE) Fact Sheet for Patients: BloggerCourse.com  Fact Sheet for Healthcare Providers: SeriousBroker.it  This test is not yet approved or cleared by  the [employer] and has been authorized for detection and/or diagnosis of SARS-CoV-2 by FDA under an Emergency Use Authorization (EUA). This EUA will remain in effect (meaning this test can be used) for the duration of the COVID-19 declaration under Section 564(b)(1) of the Act, 21 U.S.C. section 360bbb-3(b)(1), unless the authorization is terminated or revoked.  Performed at [facility], [address], [place], [place] [address]   APTT     Status: None   Collection Time: 05/12/23  8:01 PM  Result Value Ref Range   aPTT 32 24 - 36 seconds    Comment: Performed at [facility], [address], [place], [place] [address]  I-Stat Lactic Acid, ED     Status: Abnormal   Collection Time: 05/12/23  8:15 PM  Result Value Ref Range   Lactic Acid, Venous 3.4 (HH) 0.5 - 1.9 mmol/L   Comment NOTIFIED PHYSICIAN   I-Stat Lactic Acid, ED     Status: Abnormal   Collection Time: 05/12/23 10:53 PM  Result Value Ref Range   Lactic Acid, Venous 3.1 (HH) 0.5 - 1.9 mmol/L   Comment NOTIFIED PHYSICIAN    CT PELVIS W CONTRAST Result Date: 05/12/2023 CLINICAL DATA:  Soft tissue infection suspected, pelvis, xray done EXAM: CT PELVIS WITH CONTRAST TECHNIQUE: Multidetector CT imaging of the pelvis was performed using the standard protocol following the bolus administration of intravenous contrast. RADIATION DOSE REDUCTION: This exam was performed according to the departmental dose-optimization program which includes automated exposure control,  adjustment of the mA and/or kV according to patient size and/or use of iterative reconstruction technique. CONTRAST:  75mL OMNIPAQUE IOHEXOL 350 MG/ML SOLN COMPARISON:  None Available. FINDINGS: Urinary Tract:  No abnormality visualized. Bowel:  Stool throughout the majority of the visualized colon. Vascular/Lymphatic: Atherosclerotic plaque. No pathologically enlarged lymph nodes. No significant vascular abnormality seen. Reproductive:  No mass or other significant abnormality Other: No intraperitoneal free fluid. No intraperitoneal free gas. No organized fluid collection. Musculoskeletal: Midline lower back and gluteal subcutaneus soft tissue edema and emphysema. Gas extends to the soft tissues along the sacrum. Right femoral head avascular necrosis. No acute displaced fracture. No acute displaced fracture or dislocation of the bilateral hips. No acute displaced fracture or diastasis of the bones of the pelvis. No cortical erosion or destruction. Grade 2 anterolisthesis of L4 on L5. Degenerative changes of visualized lower lumbar spine. IMPRESSION: 1. Midline lower back and gluteal subcutaneus soft tissue edema and emphysema. Gas extends to the soft tissues along the sacrum. A necrotizing fasciitis cannot be excluded as this is a clinical diagnosis. 2. No CT evidence of osteomyelitis. 3. Constipation.  8 cm rectal stool ball. 4. Grade 2 anterolisthesis of L4 on L5. 5.  Aortic Atherosclerosis (ICD10-I70.0). Electronically Signed   By: Tish Frederickson M.D.   On: 05/12/2023 23:38   DG Chest Port 1 View Result Date: 05/12/2023 CLINICAL DATA:  Possible sepsis EXAM: PORTABLE CHEST 1 VIEW COMPARISON:  05/02/2023, 07/22/2019 FINDINGS: Chronic pleural and parenchymal scarring at the left greater than right apex with elevation the hila and areas of architectural distortion. No acute airspace disease or effusion. Stable cardiomediastinal silhouette with aortic atherosclerosis. Left apical progressive airspace disease  compared to more remote exams. IMPRESSION: 1. No active disease. 2. Largely chronic lung disease with biapical pleuroparenchymal scarring and areas of distortion and fibrosis. Left apical density slightly progressive compared to more remote exams and could be secondary to progressive scarring, superimposed infection or developing mass and  chest CT follow-up was previously recommended. Electronically Signed   By: Jasmine Pang M.D.   On: 05/12/2023 23:05    Assessment/Plan 78 y/o F w/ a hx of advanced dementia, sarcoidosis, COPD, HTN, and kidney stones who was admitted 2/20-3/10 for PNA and then re-presented to the ED today with concern for AMS and has a CT showing a large stool burden as well as subcutaneous gas along the sacrum and lower back.  - Low suspicion for an NSTI at this time. Her wounds appear more chronic in nature and there is no large undrained collection identified on CT. Review of imaging from 2/28 shows relatively unchanged appearance of her wounds. - Agree with admission to medicine.  - Wound care: Medihoney to the sacrum and cover with a dressing. Change BID or when soiled.  - Optimize nutrition and avoid pressure to the area as able - Given the significant stool burden she would benefit from an aggressive bowel regimen, including enemas  Tacy Learn Surgery 05/13/2023, 12:15 AM Please see Amion for pager number during day hours 7:00am-4:30pm or 7:00am -11:30am on weekends

## 2023-05-13 NOTE — ED Notes (Signed)
 Tried to feed pt but pt stated "I don't want nothing". Informed pt the options and pt refused.

## 2023-05-13 NOTE — ED Notes (Signed)
 Patient transported to CT

## 2023-05-13 NOTE — Plan of Care (Signed)

## 2023-05-13 NOTE — Consult Note (Addendum)
 WOC Nurse Consult Note: Reason for Consult: Requested to assess sacrum and bilateral buttock. The pt is follow by surgical team today by Dr. Earl Lites. Performed remotely after assess the history, photos and notes. Wound type: Unstageable PI Pressure Injury POA: Yes Measurement: (see flowsheet) Wound bed: Black eschar 80% 20% of yellow and red tissue. Drainage (amount, consistency, odor) Serous, purulent (see flowsheet) Periwound: Intact, maroon coloration on the skin surrounding. Dressing procedure/placement/frequency: Sacrum and buttock: Apply Medihoney to the wound bed, cover with a foam dressing. Change BID or when soiled. The foam dressing can stay for 3 days or change PRN.  WOC team will not plan to follow further.  Please reconsult if further assistance is needed. Thank-you,  Denyse Amass BSN, RN, [employer], WOC  (Pager: [phone])

## 2023-05-13 NOTE — ED Provider Notes (Signed)
 I was asked to follow-up on the patient's CT imaging before admission as patient does have a sacral decubitus ulcer.  There was mention of possible necrotizing fasciitis on CT imaging.  On direct evaluation and visualization this does seem to be more of a chronic wound.  There are some mild overlying erythema and drainage.  This is seem to be relatively well circumscribed over the sacrum.  I do not appreciate any crepitus here.  A call was placed to surgery to discuss but I doubt this will need emergent intervention this evening but may require some debridement during her hospitalization.  A call was placed to the hospitalist service for admission.  Physical Exam  BP 113/63   Pulse (!) 110   Temp 100.1 F (37.8 C) (Oral)   Resp 16   SpO2 100%   Physical Exam General: Chronically ill-appearing in no acute distress  Procedures  Procedures  ED Course / MDM    Medical Decision Making Amount and/or Complexity of Data Reviewed Labs: ordered. Radiology: ordered.  Risk OTC drugs. Prescription drug management. Decision regarding hospitalization.          Durwin Glaze, MD 05/13/23 [phone]

## 2023-05-14 DIAGNOSIS — L89154 Pressure ulcer of sacral region, stage 4: Secondary | ICD-10-CM | POA: Diagnosis not present

## 2023-05-14 DIAGNOSIS — A419 Sepsis, unspecified organism: Secondary | ICD-10-CM | POA: Diagnosis not present

## 2023-05-14 MED ORDER — MEMANTINE HCL 10 MG PO TABS
10.0000 mg | ORAL_TABLET | Freq: Two times a day (BID) | ORAL | Status: DC
  Administered 2023-05-14 – 2023-05-23 (×19): 10 mg via ORAL
  Filled 2023-05-14 (×19): qty 1

## 2023-05-14 MED ORDER — SMOG ENEMA
960.0000 mL | Freq: Once | RECTAL | Status: AC
  Administered 2023-05-14: 960 mL via RECTAL
  Filled 2023-05-14: qty 960

## 2023-05-14 MED ORDER — SENNOSIDES-DOCUSATE SODIUM 8.6-50 MG PO TABS
2.0000 | ORAL_TABLET | Freq: Two times a day (BID) | ORAL | Status: DC
  Administered 2023-05-14 – 2023-05-23 (×11): 2 via ORAL
  Filled 2023-05-14 (×12): qty 2

## 2023-05-14 NOTE — Progress Notes (Signed)
 Patricia Davies  QMV:[id number] DOB: February 02, 1946 DOA: 05/12/2023 PCP: [name], MD    Brief Narrative:  78 year old with a history of severe dementia, chronic sacral decubitus ulcer, generalized adult failure to thrive, sarcoidosis, COPD, HTN, and RLE DVT who was admitted to [facility] 2/20-3/12/2023 with sepsis due to pneumonia who was brought back to the ER with significant lethargy generalized weakness and recurrent fever to 102 after returning home.  In the ER she was found to have a temperature of 103.6.  CT of the pelvis revealed findings consistent with her known chronic sacral decubitus but worrisome for acute infection/fasciitis of the region.  The patient was seen by general surgery in the ER who had a low suspicion for true fasciitis.  Goals of Care:   Code Status: Limited: Do not attempt resuscitation (DNR) -DNR-LIMITED -Do Not Intubate/DNI    DVT prophylaxis:  apixaban (ELIQUIS) tablet 5 mg   Interim Hx: Afebrile since admission.  Vital signs stable.  Resting comfortably in bed.  Very pleasant.  Denies any new complaints.  In good spirits.  Assessment & Plan:  Sepsis of unclear etiology Recurrent/worsening pneumonia versus overlying cellulitis of decubitus ulcers -CT chest this admission noted resolution of right lower lobe infiltrate but new area of left lower lobe infiltrate -perhaps she suffered an isolated aspiration event -clinically appears stable from a respiratory standpoint at this time  Chronic decubitus ulceration Wound care as per General Surgery and WOC -on empiric antibiotic presently  8 cm rectal stool ball/constipation Increase bowel regimen to include an enema to be dosed today  Chronic right lower extremity DVT Continue usual Eliquis therapy  Failure to thrive in adult -protein calorie malnutrition  Sarcoidosis Severe fibrosis bilateral upper lung zones noted on CT chest  Presumed mycetoma in left apical lung cavity Dates back to at  least 08/08/2016 -appears slightly enlarged on imaging this admission  Dementia with behavioral disturbance Pleasant and alert at the time of my visit today  Family Communication: No family present at time of exam Disposition: Unclear at present   Objective: Blood pressure 112/70, pulse 88, temperature 98.2 F (36.8 C), temperature source Oral, resp. rate 18, height 5' (1.524 m), weight 39 kg, SpO2 100%.  Intake/Output Summary (Last 24 hours) at 05/14/2023 1043 Last data filed at 05/14/2023 2841 Gross per 24 hour  Intake 244.68 ml  Output 900 ml  Net -655.32 ml   Filed Weights   05/13/23 1519  Weight: 39 kg    Examination: General: No acute respiratory distress Lungs: Mild bilateral basilar crackles left >right Cardiovascular: Regular rate and rhythm without murmur gallop or rub normal S1 and S2 Abdomen: Nontender, nondistended, soft, bowel sounds positive, no rebound, no ascites, no appreciable mass Extremities: No significant cyanosis, clubbing, or edema bilateral lower extremities  CBC: Recent Labs  Lab 05/11/23 0451 05/12/23 2001 05/13/23 0457  WBC 5.5 4.4 5.4  NEUTROABS  --  3.3 3.6  HGB 7.5* 10.4* 8.2*  HCT 24.4* 32.4* 25.7*  MCV 103.4* 102.2* 102.4*  PLT 278 328 251   Basic Metabolic Panel: Recent Labs  Lab 05/08/23 0441 05/11/23 0451 05/12/23 2001 05/13/23 0457  NA 140 142 137 133*  K 3.5 4.1 4.3 4.1  CL 105 106 99 103  CO2 27 28 29 27   GLUCOSE 93 91 141* 111*  BUN 13 16 15 9   CREATININE 0.37* <0.30* 0.63 0.42*  CALCIUM 8.4* 8.5* 9.0 7.9*  MG 1.8 1.8  --   --  GFR: Estimated Creatinine Clearance: 37.4 mL/min (A) (by C-G formula based on SCr of 0.42 mg/dL (L)).   Scheduled Meds:  apixaban  5 mg Oral BID   folic acid  1 mg Oral Daily   leptospermum manuka honey  1 Application Topical Daily   polyethylene glycol  17 g Oral BID   Continuous Infusions:  ceFEPime (MAXIPIME) IV 2 g (05/14/23 0839)   vancomycin       LOS: 1 day   Lonia Blood, MD [facility] Office  [phone] Pager - Text Page per Loretha Stapler  If 7PM-7AM, please contact night-coverage per Amion 05/14/2023, 10:43 AM

## 2023-05-14 NOTE — TOC CM/SW Note (Signed)
 Transition of Care [facility]) - Inpatient Brief Assessment   Patient Details  Name: Patricia Davies MRN: [id number] Date of Birth: 1946-02-01  [facility] CM/SW Contact:    Mearl Latin, LCSW Phone Number: 05/14/2023, 2:25 PM   Clinical Narrative: Patient admitted from home with son (and daughter prior to recent hospital discharge). Home Health services were set up with [facility] and a hospital bed was delivered to the home. Family was advised to apply for Medicaid for long term care. TOC following for potential needs.    Transition of Care Asessment: Insurance and Status: Insurance coverage has been reviewed Patient has primary care physician: Yes Home environment has been reviewed: From home Prior level of function:: Bed bound Prior/Current Home Services: Current home services Social Drivers of Health Review: SDOH reviewed no interventions necessary Readmission risk has been reviewed: Yes Transition of care needs: transition of care needs identified, TOC will continue to follow

## 2023-05-15 DIAGNOSIS — L89154 Pressure ulcer of sacral region, stage 4: Secondary | ICD-10-CM | POA: Diagnosis not present

## 2023-05-15 DIAGNOSIS — A419 Sepsis, unspecified organism: Secondary | ICD-10-CM | POA: Diagnosis not present

## 2023-05-15 LAB — CBC
HCT: 25.8 % — ABNORMAL LOW (ref 36.0–46.0)
Hemoglobin: 8.4 g/dL — ABNORMAL LOW (ref 12.0–15.0)
MCH: 32.1 pg (ref 26.0–34.0)
MCHC: 32.6 g/dL (ref 30.0–36.0)
MCV: 98.5 fL (ref 80.0–100.0)
Platelets: 243 10*3/uL (ref 150–400)
RBC: 2.62 MIL/uL — ABNORMAL LOW (ref 3.87–5.11)
RDW: 16.4 % — ABNORMAL HIGH (ref 11.5–15.5)
WBC: 4 10*3/uL (ref 4.0–10.5)
nRBC: 0 % (ref 0.0–0.2)

## 2023-05-15 LAB — BASIC METABOLIC PANEL
Anion gap: 7 (ref 5–15)
BUN: 12 mg/dL (ref 8–23)
CO2: 30 mmol/L (ref 22–32)
Calcium: 8.5 mg/dL — ABNORMAL LOW (ref 8.9–10.3)
Chloride: 99 mmol/L (ref 98–111)
Creatinine, Ser: 0.48 mg/dL (ref 0.44–1.00)
GFR, Estimated: >60 mL/min (ref >60)
Glucose, Bld: 110 mg/dL — ABNORMAL HIGH (ref 70–99)
Potassium: 3.2 mmol/L — ABNORMAL LOW (ref 3.5–5.1)
Sodium: 136 mmol/L (ref 135–145)

## 2023-05-15 MED ORDER — AMOXICILLIN-POT CLAVULANATE 875-125 MG PO TABS
1.0000 | ORAL_TABLET | Freq: Two times a day (BID) | ORAL | Status: AC
Start: 2023-05-15 — End: 2023-05-21
  Administered 2023-05-15 – 2023-05-21 (×14): 1 via ORAL
  Filled 2023-05-15 (×14): qty 1

## 2023-05-15 MED ORDER — POTASSIUM CHLORIDE CRYS ER 20 MEQ PO TBCR
40.0000 meq | EXTENDED_RELEASE_TABLET | Freq: Once | ORAL | Status: AC
  Administered 2023-05-15: 40 meq via ORAL
  Filled 2023-05-15: qty 2

## 2023-05-15 NOTE — Progress Notes (Signed)
 Progress Note     Subjective: Pt denies significant pain this AM. Pleasantly confused. No family at bedside   Objective: Vital signs in last 24 hours: Temp:  [98 F (36.7 C)-98.5 F (36.9 C)] 98.5 F (36.9 C) (03/12 2212) Pulse Rate:  [59-101] 59 (03/13 0738) Resp:  [18] 18 (03/13 0738) BP: (109-120)/(67-84) 109/84 (03/13 0738) SpO2:  [66 %-96 %] 66 % (03/13 0738) Last BM Date : 05/15/23  Intake/Output from previous day: 03/12 0701 - 03/13 0700 In: 240 [P.O.:240] Out: 1300 [Urine:1300] Intake/Output this shift: Total I/O In: 100 [P.O.:100] Out: -   PE: General: pleasant, WD, chronically ill appearing female who is laying in bed in NAD Heart: regular, rate, and rhythm.   Lungs:  Respiratory effort nonlabored Abd: soft, NT, ND GU: bilateral ischial wounds with dry eschar but no signs of cellulitis, sacral wound with loose eschar present and fibrinous exudate underlying, able to unroof to allow for better packing of wound, wound explored and does not track or undermine much and no pockets of undrained purulent drainage   Lab Results:  Recent Labs    05/13/23 0457 05/15/23 0400  WBC 5.4 4.0  HGB 8.2* 8.4*  HCT 25.7* 25.8*  PLT 251 243   BMET Recent Labs    05/13/23 0457 05/15/23 0400  NA 133* 136  K 4.1 3.2*  CL 103 99  CO2 27 30  GLUCOSE 111* 110*  BUN 9 12  CREATININE 0.42* 0.48  CALCIUM 7.9* 8.5*   PT/INR Recent Labs    05/12/23 2001  LABPROT 15.6*  INR 1.2   CMP     Component Value Date/Time   NA 136 05/15/2023 0400   K 3.2 (L) 05/15/2023 0400   CL 99 05/15/2023 0400   CO2 30 05/15/2023 0400   GLUCOSE 110 (H) 05/15/2023 0400   BUN 12 05/15/2023 0400   CREATININE 0.48 05/15/2023 0400   CALCIUM 8.5 (L) 05/15/2023 0400   PROT 7.0 05/12/2023 2001   ALBUMIN 2.7 (L) 05/12/2023 2001   AST 30 05/12/2023 2001   ALT 23 05/12/2023 2001   ALKPHOS 51 05/12/2023 2001   BILITOT 0.5 05/12/2023 2001   GFRNONAA >60 05/15/2023 0400   GFRAA >90  12/18/2010 0600   Lipase     Component Value Date/Time   LIPASE 58 (H) 08/28/2022 1245       Studies/Results: No results found.  Anti-infectives: Anti-infectives (From admission, onward)    Start     Dose/Rate Route Frequency Ordered Stop   05/15/23 1130  amoxicillin-clavulanate (AUGMENTIN) 875-125 MG per tablet 1 tablet        1 tablet Oral Every 12 hours 05/15/23 1038     05/14/23 1000  vancomycin (VANCOCIN) IVPB 1000 mg/200 mL premix  Status:  Discontinued        1,000 mg 200 mL/hr over 60 Minutes Intravenous Every 36 hours 05/13/23 0411 05/14/23 1715   05/13/23 0800  ceFEPIme (MAXIPIME) 2 g in sodium chloride 0.9 % 100 mL IVPB  Status:  Discontinued        2 g 200 mL/hr over 30 Minutes Intravenous Every 12 hours 05/13/23 0411 05/15/23 1038   05/12/23 2000  ceFEPIme (MAXIPIME) 2 g in sodium chloride 0.9 % 100 mL IVPB        2 g 200 mL/hr over 30 Minutes Intravenous  Once 05/12/23 1958 05/12/23 2049   05/12/23 2000  metroNIDAZOLE (FLAGYL) IVPB 500 mg        500 mg 100 mL/hr  over 60 Minutes Intravenous  Once 05/12/23 1958 05/12/23 2210   05/12/23 2000  vancomycin (VANCOCIN) IVPB 1000 mg/200 mL premix        1,000 mg 200 mL/hr over 60 Minutes Intravenous  Once 05/12/23 1958 05/12/23 2311        Assessment/Plan  Sacral and ischial pressure wounds, unstageable - eschar present to bilateral ischial wounds, no signs of cellutitis - agree with medihoney and dressing changes - loose eschar over sacral wound now gone - fibrinous non-viable tissue remains in wound base. Would not recommend operative debridement. Recommend BID wet to dry dressings to sacral wound or PRN if soiled. Offload pressure as able  - no indication for acute surgical intervention, no concern for NSTI or abscess at this time - would recommend referral to wound care center upon discharge - general surgery will sign off, please reconsult as needed    LOS: 2 days   I reviewed hospitalist notes, last 24 h  vitals and pain scores, last 48 h intake and output, last 24 h labs and trends, and last 24 h imaging results.  This care required moderate level of medical decision making.    Juliet Rude, [facility] 05/15/2023, 11:06 AM Please see Amion for pager number during day hours 7:00am-4:30pm

## 2023-05-15 NOTE — Consult Note (Signed)
 [facility] Liaison Consult Note   05/15/2023  Patricia Davies 26-Jan-1946 [id number]  Insurance: [employer] Medicare Choice PPO  Primary Care Provider: [name], MD with [facility], this provider is listed for the transition of care follow up appointments  and [facility] calls   [facility] Liaison unit rounding 11 am, no family at bedside.    The patient was screened for less than 7 day readmission hospitalization with noted medium risk score for unplanned readmission risk 2 hospital admissions in 6 months.  The patient was assessed for potential [facility] Coordination service needs for post hospital transition for care coordination. Review of patient's electronic medical record reveals patient is readmitted with Sepsis.  Patient was from home PTA and had been at [facility] 04/24/23 to 05/12/23 noted. Patient noted with Advanced Dementia and was bed bound.  Plan: [facility] Liaison will continue to follow progress and disposition to asess for post hospital community care coordination/management needs.  Referral request for community care coordination: Disposition not known at this time. Following.   [facility] Community Care, Population Health does not replace or interfere with any arrangements made by the Inpatient Transition of Care team.   For questions contact:   Charlesetta Shanks, RN, BSN, CCM [place]  [facility], [facility] Liaison Direct Dial: [phone] or secure chat Email: [email or web address]

## 2023-05-15 NOTE — Progress Notes (Signed)
 Patricia Davies  WJX:[id number] DOB: 09-Dec-1945 DOA: 05/12/2023 PCP: [name], MD    Brief Narrative:  78 year old with a history of severe dementia, chronic sacral decubitus ulcer, generalized adult failure to thrive, sarcoidosis, COPD, HTN, and RLE DVT who was admitted to [facility] 2/20 > 05/12/2023 with sepsis due to pneumonia who was brought back to the ER with significant lethargy generalized weakness and recurrent fever to 102 after returning home.  In the ER she was found to have a temperature of 103.6.  CT of the pelvis revealed findings consistent with her known chronic sacral decubitus but worrisome for acute infection/fasciitis of the region.  The patient was seen by General Surgery in the ER who had a low suspicion for true fasciitis.  Goals of Care:   Code Status: Limited: Do not attempt resuscitation (DNR) -DNR-LIMITED -Do Not Intubate/DNI    DVT prophylaxis:  apixaban (ELIQUIS) tablet 5 mg   Interim Hx: Has remained afebrile since initial high fever measured at presentation.  Vital signs otherwise stable.  Alert and quite pleasant at the time of visit today.  Denies any complaints.  Resting comfortably.  In no distress.  Assessment & Plan:  Sepsis of unclear etiology - suspected aspiration pneumonitis  Recurrent/worsening pneumonia versus overlying cellulitis of decubitus ulcers -CT chest this admission noted resolution of right lower lobe infiltrate but new area of left lower lobe infiltrate -perhaps she suffered an isolated aspiration event -clinically she appears stable from a respiratory standpoint at this time -sepsis physiology has resolved -would also be at risk for transient bacteremia related to her significant decubitus  Chronic decubitus ulceration Wound care as per General Surgery and WOC -on empiric antibiotic presently  8 cm rectal stool ball/constipation Patient had a significant bowel movement documented 3/12 after a smog enema was  administered  Hypokalemia Due to poor oral intake -supplement and follow  Chronic right lower extremity DVT Continue usual Eliquis therapy  Failure to thrive in adult -protein calorie malnutrition  Sarcoidosis Severe fibrosis bilateral upper lung zones noted on CT chest  Presumed mycetoma in left apical lung cavity Dates back to at least 08/08/2016 -appears slightly enlarged on imaging this admission  Dementia with behavioral disturbance Pleasant and alert at the time of my visit today  Family Communication: Spoke with son via telephone Disposition: Appears to be stabilizing -transition to oral antibiotics and monitor another 24 hours with possible discharge eligibility over the weekend   Objective: Blood pressure 109/84, pulse (!) 59, temperature 98.5 F (36.9 C), resp. rate 18, height 5' (1.524 m), weight 39 kg, SpO2 (!) 66%.  Intake/Output Summary (Last 24 hours) at 05/15/2023 1033 Last data filed at 05/15/2023 0830 Gross per 24 hour  Intake 340 ml  Output 800 ml  Net -460 ml   Filed Weights   05/13/23 1519  Weight: 39 kg    Examination: General: No acute respiratory distress Lungs: Improved air movement in all fields today with no focal crackles or wheeze Cardiovascular: Regular rate and rhythm without murmur gallop or rub normal S1 and S2 Abdomen: Nontender, nondistended, soft, bowel sounds positive, no rebound, no ascites, no appreciable mass Extremities: No significant cyanosis, clubbing, or edema bilateral lower extremities  CBC: Recent Labs  Lab 05/12/23 2001 05/13/23 0457 05/15/23 0400  WBC 4.4 5.4 4.0  NEUTROABS 3.3 3.6  --   HGB 10.4* 8.2* 8.4*  HCT 32.4* 25.7* 25.8*  MCV 102.2* 102.4* 98.5  PLT 328 251 243   Basic Metabolic  Panel: Recent Labs  Lab 05/11/23 0451 05/12/23 2001 05/13/23 0457 05/15/23 0400  NA 142 137 133* 136  K 4.1 4.3 4.1 3.2*  CL 106 99 103 99  CO2 28 29 27 30   GLUCOSE 91 141* 111* 110*  BUN 16 15 9 12   CREATININE  <0.30* 0.63 0.42* 0.48  CALCIUM 8.5* 9.0 7.9* 8.5*  MG 1.8  --   --   --    GFR: Estimated Creatinine Clearance: 37.4 mL/min (by C-G formula based on SCr of 0.48 mg/dL).   Scheduled Meds:  apixaban  5 mg Oral BID   folic acid  1 mg Oral Daily   leptospermum manuka honey  1 Application Topical Daily   memantine  10 mg Oral BID   polyethylene glycol  17 g Oral BID   senna-docusate  2 tablet Oral BID     LOS: 2 days   Lonia Blood, MD [facility] Office  [phone] Pager - Text Page per Loretha Stapler  If 7PM-7AM, please contact night-coverage per Amion 05/15/2023, 10:33 AM

## 2023-05-15 NOTE — Evaluation (Signed)
 Occupational Therapy Evaluation Patient Details Name: Patricia Davies MRN: [id number] DOB: 03/29/1945 Today's Date: 05/15/2023   History of Present Illness   Pt is a 78 yo female presenting to [facility] ED for significant lethargy, weakness, and fever being worked up for sepsis. Pt was recently admitted to [facility] 2/20-3/12/2023 with sepsis due to pneumonia. PMH of demenia,  chronic sacral decubitus ulcer, generalized adult failure to thrive, sarcoidosis, COPD, HTN, and RLE DVT     Clinical Impressions Pt admitted for above, she is a poor historian so some information regarding PLOF and home setup obtained from son. Pt has been mainly bed level, family just received DME to care for pt at home on Monday, she has assist with all ADLs. Pt currently needing Mod A for bed mobility, Max A +2 for OOB attempts and seems very fearful to stand although she denies it. She needs Total A to CGA for ADLs, does possess the capacity to do some grooming without much physical assist. OT to continue following pt acutely at a low frequency to reduce the risk of decline. Patient would benefit from post acute Home OT services to help maximize functional independence in natural environment      If plan is discharge home, recommend the following:   Two people to help with walking and/or transfers;A lot of help with bathing/dressing/bathroom;Assistance with cooking/housework;Assist for transportation;Supervision due to cognitive status;Direct supervision/assist for medications management;Direct supervision/assist for financial management;Help with stairs or ramp for entrance     Functional Status Assessment   Patient has had a recent decline in their functional status and demonstrates the ability to make significant improvements in function in a reasonable and predictable amount of time.     Equipment Recommendations   None recommended by OT (Son reports having rec DME)     Recommendations for Other  Services         Precautions/Restrictions   Precautions Precautions: Fall Recall of Precautions/Restrictions: Impaired Precaution/Restrictions Comments: sacral wounds Restrictions Weight Bearing Restrictions Per Provider Order: No     Mobility Bed Mobility Overal bed mobility: Needs Assistance Bed Mobility: Supine to Sit, Sit to Supine     Supine to sit: Mod assist Sit to supine: Max assist   General bed mobility comments: Pt assists by using UE to pull up trunk, cues needed to sequence and initiate BLE movement.    Transfers Overall transfer level: Needs assistance Equipment used: 2 person hand held assist Transfers: Sit to/from Stand Sit to Stand: Max assist, +2 physical assistance, +2 safety/equipment           General transfer comment: Max A +2 for partial stand to clear bottom, not able to achieve full stand. Pt extends arms/legs with RLE continiously propping out to the side, hips shifted posteriorly. Pt denies any fear of falling      Balance Overall balance assessment: Needs assistance Sitting-balance support: No upper extremity supported, Feet supported Sitting balance-Leahy Scale: Fair Sitting balance - Comments: min A to CGA, occasional need for min A to reposition back into midline   Standing balance support: Bilateral upper extremity supported Standing balance-Leahy Scale: Zero                             ADL either performed or assessed with clinical judgement   ADL       Grooming: Sitting;Wash/dry hands;Contact guard assist Grooming Details (indicate cue type and reason): with one UE supported on bed  Lower Body Dressing: Total assistance   Toilet Transfer: Maximal assistance;+2 for physical assistance;+2 for safety/equipment Toilet Transfer Details (indicate cue type and reason): anticipate +2 Max A to transfer, pt limited only to standing today due to poor balance in standing making unsafe for transfer            General ADL Comments: Pt to be assessed for feeding, she reports she can feed herself but PT noted that it was a challenge.     Vision         Perception         Praxis         Pertinent Vitals/Pain Pain Assessment Pain Assessment: PAINAD Breathing: normal Negative Vocalization: occasional moan/groan, low speech, negative/disapproving quality Facial Expression: sad, frightened, frown Body Language: relaxed Consolability: no need to console PAINAD Score: 2 Pain Location: generalized with movement, pt denies pain but moans/groans with movement Pain Descriptors / Indicators: Grimacing, Moaning     Extremity/Trunk Assessment Upper Extremity Assessment Upper Extremity Assessment: Generalized weakness (ROM WFL) RUE Deficits / Details: edema distal UE           Communication Communication Communication: No apparent difficulties   Cognition Arousal: Alert Behavior During Therapy: WFL for tasks assessed/performed Cognition: No family/caregiver present to determine baseline, History of cognitive impairments             OT - Cognition Comments: Hx of dementia at baseline                 Following commands: Intact       Cueing  General Comments   Cueing Techniques: Verbal cues;Gestural cues;Tactile cues      Exercises Other Exercises Other Exercises: Dynamic reaching sitting EOB   Shoulder Instructions      Home Living Family/patient expects to be discharged to:: Private residence Living Arrangements: Children Available Help at Discharge: Family;Available 24 hours/day Type of Home: House Home Access: Level entry     Home Layout: One level               Home Equipment: Hospital bed;Wheelchair - Investment banker, operational Comments: contacted son to obtained home equipment, Pt a questionable historian.      Prior Functioning/Environment Prior Level of Function : Patient poor historian/Family not available              Mobility Comments: pt reports being bed level and occassionally getting to EOb with family ADLs Comments: has assist with all ADLs    OT Problem List: Decreased strength;Impaired balance (sitting and/or standing);Decreased cognition   OT Treatment/Interventions: Therapeutic exercise;Self-care/ADL training;Balance training;DME and/or AE instruction;Patient/family education;Therapeutic activities      OT Goals(Current goals can be found in the care plan section)   Acute Rehab OT Goals OT Goal Formulation: Patient unable to participate in goal setting Time For Goal Achievement: 05/29/23 Potential to Achieve Goals: Fair ADL Goals Pt Will Perform Eating: with set-up;sitting Pt Will Perform Grooming: with set-up;sitting Pt Will Transfer to Toilet: with +2 assist;with mod assist;stand pivot transfer;bedside commode Additional ADL Goal #3: Pt will tolerate >10 mins sitting EOB in preparation for tolerance to sit in recliner   OT Frequency:  Min 1X/week    Co-evaluation              AM-PAC OT "6 Clicks" Daily Activity     Outcome Measure Help from another person eating meals?: A Lot (to be assessed) Help from another person taking care of personal grooming?: A Little Help  from another person toileting, which includes using toliet, bedpan, or urinal?: A Lot Help from another person bathing (including washing, rinsing, drying)?: A Lot Help from another person to put on and taking off regular upper body clothing?: A Lot Help from another person to put on and taking off regular lower body clothing?: Total 6 Click Score: 12   End of Session Equipment Utilized During Treatment: Gait belt Nurse Communication: Mobility status;Need for lift equipment (hoyer lift to transfer)  Activity Tolerance: Patient tolerated treatment well Patient left: in bed;with call bell/phone within reach;with bed alarm set  OT Visit Diagnosis: Unsteadiness on feet (R26.81);Other abnormalities of  gait and mobility (R26.89);Muscle weakness (generalized) (M62.81)                Time: 1000-1022 OT Time Calculation (min): 22 min Charges:  OT General Charges $OT Visit: 1 Visit OT Evaluation $OT Eval Moderate Complexity: 1 Mod  05/15/2023  AB, OTR/L  Acute Rehabilitation Services  Office: [phone]   Tristan Schroeder 05/15/2023, 1:11 PM

## 2023-05-15 NOTE — Evaluation (Signed)
 Physical Therapy Evaluation Patient Details Name: Patricia Davies MRN: [id number] DOB: 1945-07-28 Today's Date: 05/15/2023  History of Present Illness  Pt is a 78 yo female presenting to [facility] ED for significant lethargy, weakness, and fever being worked up for sepsis. Pt was recently admitted to [facility] 2/20-3/12/2023 with sepsis due to pneumonia. PMH of demenia,  chronic sacral decubitus ulcer, generalized adult failure to thrive, sarcoidosis, COPD, HTN, and RLE DVT  Clinical Impression  Pt admitted with above diagnosis. Pt from home with family where she has assist for all activities. Pt appears close to baseline on eval. Will follow acutely so pt does not lose ability to sit up EOB or self feed but no PT recommendations at d/c.  Pt currently with functional limitations due to the deficits listed below (see PT Problem List). Pt will benefit from acute skilled PT to increase their independence and safety with mobility to allow discharge.           If plan is discharge home, recommend the following: A lot of help with bathing/dressing/bathroom;A lot of help with walking and/or transfers;Assistance with cooking/housework;Assist for transportation;Help with stairs or ramp for entrance;Direct supervision/assist for medications management   Can travel by private vehicle   No    Equipment Recommendations None recommended by PT  Recommendations for Other Services       Functional Status Assessment Patient has had a recent decline in their functional status and demonstrates the ability to make significant improvements in function in a reasonable and predictable amount of time.     Precautions / Restrictions Precautions Precautions: Fall Recall of Precautions/Restrictions: Impaired Precaution/Restrictions Comments: sacral wounds Restrictions Weight Bearing Restrictions Per Provider Order: No      Mobility  Bed Mobility Overal bed mobility: Needs Assistance Bed Mobility: Supine  to Sit, Sit to Supine     Supine to sit: Max assist Sit to supine: Max assist   General bed mobility comments: pt pulls on rail with LUE but pt maintaining L lean so difficult to push up to R. When brought to midline it seemed that she felt she would fall.    Transfers Overall transfer level: Needs assistance Equipment used: None Transfers: Sit to/from Stand Sit to Stand: Max assist           General transfer comment: pt given max A for partial sit>stand, kept RLE abducted, leaning posterior, unable to come all the way up    Ambulation/Gait               General Gait Details: unable  Stairs            Wheelchair Mobility     Tilt Bed    Modified Rankin (Stroke Patients Only)       Balance Overall balance assessment: Needs assistance Sitting-balance support: No upper extremity supported, Feet supported Sitting balance-Leahy Scale: Poor Sitting balance - Comments: needed mod A to sit initially due to R lean. Pt ate part of her lunch with mod support. Progressed to min with increased time. Postural control: Right lateral lean, Posterior lean Standing balance support: Bilateral upper extremity supported Standing balance-Leahy Scale: Zero                               Pertinent Vitals/Pain Pain Assessment Pain Assessment: Faces Faces Pain Scale: Hurts little more Pain Location: R knee with flexion Pain Descriptors / Indicators: Grimacing Pain Intervention(s): Limited activity within patient's  tolerance, Monitored during session    Home Living Family/patient expects to be discharged to:: Private residence Living Arrangements: Children Available Help at Discharge: Family;Available 24 hours/day Type of Home: House Home Access: Level entry       Home Layout: One level Home Equipment: Hospital bed;Wheelchair - Teacher, early years/pre;Other (comment) (hoyer) Additional Comments: info from son. Pt poor historian    Prior  Function Prior Level of Function : Needs assist             Mobility Comments: family assists pt to w/c ADLs Comments: has assist with all ADLs     Extremity/Trunk Assessment   Upper Extremity Assessment Upper Extremity Assessment: Defer to OT evaluation RUE Deficits / Details: edema distal UE    Lower Extremity Assessment Lower Extremity Assessment: Generalized weakness;RLE deficits/detail;Difficult to assess due to impaired cognition RLE Deficits / Details: pt able to tolerate knee flexion to 90 deg but cannot tolerate pushing with RLE to help stand up. Keeps RLE abducted with pressure off knee. Noted swelling R knee. R foot drop LLE Deficits / Details: AAROM grossly WFL, further testing limited by cognition, strength 3/5    Cervical / Trunk Assessment Cervical / Trunk Assessment: Kyphotic  Communication   Communication Communication: No apparent difficulties    Cognition Arousal: Alert Behavior During Therapy: WFL for tasks assessed/performed   PT - Cognitive impairments: History of cognitive impairments, No family/caregiver present to determine baseline                       PT - Cognition Comments: memory impairment noted as well as decreased safety and positional awareness Following commands: Intact       Cueing Cueing Techniques: Verbal cues, Gestural cues, Tactile cues     General Comments General comments (skin integrity, edema, etc.): Set pt's lunch up for her to finish once back in bed. Noted visual deficits when pt asked if she was holding her fork    Exercises     Assessment/Plan    PT Assessment Patient needs continued PT services  PT Problem List Decreased strength;Decreased balance;Decreased cognition;Decreased activity tolerance;Decreased mobility;Decreased range of motion;Decreased coordination;Decreased safety awareness;Pain       PT Treatment Interventions Functional mobility training;Therapeutic activities;Patient/family  education;Therapeutic exercise;Balance training;Neuromuscular re-education    PT Goals (Current goals can be found in the Care Plan section)  Acute Rehab PT Goals Patient Stated Goal: none stated PT Goal Formulation: With patient Time For Goal Achievement: 05/29/23 Potential to Achieve Goals: Fair    Frequency Min 1X/week     Co-evaluation               AM-PAC PT "6 Clicks" Mobility  Outcome Measure Help needed turning from your back to your side while in a flat bed without using bedrails?: A Lot Help needed moving from lying on your back to sitting on the side of a flat bed without using bedrails?: A Lot Help needed moving to and from a bed to a chair (including a wheelchair)?: Total Help needed standing up from a chair using your arms (e.g., wheelchair or bedside chair)?: Total Help needed to walk in hospital room?: Total Help needed climbing 3-5 steps with a railing? : Total 6 Click Score: 8    End of Session Equipment Utilized During Treatment: Oxygen Activity Tolerance: Patient tolerated treatment well Patient left: in bed;with call bell/phone within reach;with bed alarm set Nurse Communication: Mobility status PT Visit Diagnosis: Muscle weakness (generalized) (M62.81);Difficulty in walking, not elsewhere  classified (R26.2)    Time: 8295-6213 PT Time Calculation (min) (ACUTE ONLY): 17 min   Charges:   PT Evaluation $PT Eval Moderate Complexity: 1 Mod   PT General Charges $$ ACUTE PT VISIT: 1 Visit         Lyanne Co, PT  Acute Rehab Services Secure chat preferred Office [phone]   Lawana Chambers Aerion Bagdasarian 05/15/2023, 1:48 PM

## 2023-05-16 ENCOUNTER — Encounter (HOSPITAL_COMMUNITY): Payer: Self-pay | Admitting: Family Medicine

## 2023-05-16 DIAGNOSIS — L89154 Pressure ulcer of sacral region, stage 4: Secondary | ICD-10-CM | POA: Diagnosis not present

## 2023-05-16 DIAGNOSIS — A419 Sepsis, unspecified organism: Secondary | ICD-10-CM | POA: Diagnosis not present

## 2023-05-16 LAB — CBC
HCT: 25.2 % — ABNORMAL LOW (ref 36.0–46.0)
Hemoglobin: 8 g/dL — ABNORMAL LOW (ref 12.0–15.0)
MCH: 31.5 pg (ref 26.0–34.0)
MCHC: 31.7 g/dL (ref 30.0–36.0)
MCV: 99.2 fL (ref 80.0–100.0)
Platelets: 247 10*3/uL (ref 150–400)
RBC: 2.54 MIL/uL — ABNORMAL LOW (ref 3.87–5.11)
RDW: 16.5 % — ABNORMAL HIGH (ref 11.5–15.5)
WBC: 4.6 10*3/uL (ref 4.0–10.5)
nRBC: 0 % (ref 0.0–0.2)

## 2023-05-16 LAB — BASIC METABOLIC PANEL
Anion gap: 6 (ref 5–15)
BUN: 12 mg/dL (ref 8–23)
CO2: 27 mmol/L (ref 22–32)
Calcium: 8.2 mg/dL — ABNORMAL LOW (ref 8.9–10.3)
Chloride: 101 mmol/L (ref 98–111)
Creatinine, Ser: 0.57 mg/dL (ref 0.44–1.00)
GFR, Estimated: >60 mL/min (ref >60)
Glucose, Bld: 86 mg/dL (ref 70–99)
Potassium: 3.7 mmol/L (ref 3.5–5.1)
Sodium: 134 mmol/L — ABNORMAL LOW (ref 135–145)

## 2023-05-16 LAB — MAGNESIUM: Magnesium: 1.7 mg/dL (ref 1.7–2.4)

## 2023-05-16 MED ORDER — MAGNESIUM SULFATE 2 GM/50ML IV SOLN
2.0000 g | Freq: Once | INTRAVENOUS | Status: AC
  Administered 2023-05-16: 2 g via INTRAVENOUS
  Filled 2023-05-16: qty 50

## 2023-05-16 NOTE — Progress Notes (Signed)
 Patricia Davies  WUJ:[id number] DOB: 06-18-1945 DOA: 05/12/2023 PCP: [name], MD    Brief Narrative:  78 year old with a history of severe dementia, chronic sacral decubitus ulcer, generalized adult failure to thrive, sarcoidosis, COPD, HTN, and RLE DVT who was admitted to [facility] 2/20 > 05/12/2023 with sepsis due to pneumonia and was subsequently brought back to the ER with significant lethargy generalized weakness and recurrent fever to 102 after returning home.  In the ER she was found to have a temperature of 103.6.  CT of the pelvis revealed findings consistent with her known chronic sacral decubitus but worrisome for acute infection/fasciitis of the region.  The patient was seen by General Surgery in the ER who had a low suspicion for true fasciitis.  Goals of Care:   Code Status: Limited: Do not attempt resuscitation (DNR) -DNR-LIMITED -Do Not Intubate/DNI    DVT prophylaxis:  apixaban (ELIQUIS) tablet 5 mg   Interim Hx: No acute events recorded overnight.  Afebrile.  Vital signs stable.  Resting comfortably in bed.  Quite pleasant.  Denies any complaints.  Assessment & Plan:  Sepsis of unclear etiology - possible aspiration pneumonitis  Recurrent/worsening pneumonia versus episodic aspiration pneumonitis versus overlying cellulitis of decubitus ulcers -CT chest this admission noted resolution of right lower lobe infiltrate but new area of left lower lobe infiltrate -perhaps she suffered an isolated aspiration event -clinically she appears stable from a respiratory standpoint -sepsis physiology has resolved -would also be at risk for transient bacteremia related to her significant decubitus  Chronic decubitus ulceration Wound care as per General Surgery and WOC - on empiric antibiotic presently  8 cm rectal stool ball/constipation Patient had a significant bowel movement documented 3/12 after a smog enema was administered - increase schedule bowel regimen and follow    Hypokalemia Due to poor oral intake -corrected with supplementation  Chronic right lower extremity DVT Continue usual Eliquis therapy  Failure to thrive in adult - protein calorie malnutrition  Sarcoidosis Severe fibrosis bilateral upper lung zones noted on CT chest  Presumed mycetoma in left apical lung cavity Dates back to at least 08/08/2016 -appears slightly enlarged on imaging this admission  Dementia with behavioral disturbance Pleasant and alert at the time of my visit today  Family Communication: Spoke with son via telephone 3/13 Disposition: Appears to be stabilizing - possible discharge eligibility over the weekend   Objective: Blood pressure 119/64, pulse 100, temperature 98.2 F (36.8 C), temperature source Oral, resp. rate 18, height 5' (1.524 m), weight 39 kg, SpO2 92%.  Intake/Output Summary (Last 24 hours) at 05/16/2023 0947 Last data filed at 05/16/2023 0853 Gross per 24 hour  Intake 240 ml  Output 500 ml  Net -260 ml   Filed Weights   05/13/23 1519  Weight: 39 kg    Examination: General: No acute respiratory distress Lungs: CLEAR to auscultation without wheezing Cardiovascular: RRR without murmur appreciated Abdomen: Nontender, nondistended, soft, bowel sounds positive, no rebound, no ascites, no appreciable mass Extremities: No significant cyanosis, clubbing, or edema bilateral lower extremities  CBC: Recent Labs  Lab 05/12/23 2001 05/13/23 0457 05/15/23 0400 05/16/23 0618  WBC 4.4 5.4 4.0 4.6  NEUTROABS 3.3 3.6  --   --   HGB 10.4* 8.2* 8.4* 8.0*  HCT 32.4* 25.7* 25.8* 25.2*  MCV 102.2* 102.4* 98.5 99.2  PLT 328 251 243 247   Basic Metabolic Panel: Recent Labs  Lab 05/11/23 0451 05/12/23 2001 05/13/23 0457 05/15/23 0400 05/16/23 9562  NA 142   < > 133* 136 134*  K 4.1   < > 4.1 3.2* 3.7  CL 106   < > 103 99 101  CO2 28   < > 27 30 27   GLUCOSE 91   < > 111* 110* 86  BUN 16   < > 9 12 12   CREATININE <0.30*   < > 0.42* 0.48 0.57   CALCIUM 8.5*   < > 7.9* 8.5* 8.2*  MG 1.8  --   --   --  1.7   < > = values in this interval not displayed.   GFR: Estimated Creatinine Clearance: 37.4 mL/min (by C-G formula based on SCr of 0.57 mg/dL).   Scheduled Meds:  amoxicillin-clavulanate  1 tablet Oral Q12H   apixaban  5 mg Oral BID   folic acid  1 mg Oral Daily   leptospermum manuka honey  1 Application Topical Daily   memantine  10 mg Oral BID   polyethylene glycol  17 g Oral BID   senna-docusate  2 tablet Oral BID     LOS: 3 days   Lonia Blood, MD [facility] Office  [phone] Pager - Text Page per Loretha Stapler  If 7PM-7AM, please contact night-coverage per Amion 05/16/2023, 9:47 AM

## 2023-05-17 DIAGNOSIS — L89154 Pressure ulcer of sacral region, stage 4: Secondary | ICD-10-CM | POA: Diagnosis not present

## 2023-05-17 DIAGNOSIS — A419 Sepsis, unspecified organism: Secondary | ICD-10-CM | POA: Diagnosis not present

## 2023-05-17 LAB — CULTURE, BLOOD (ROUTINE X 2)
Culture: NO GROWTH
Culture: NO GROWTH
Special Requests: ADEQUATE

## 2023-05-17 LAB — MAGNESIUM: Magnesium: 2 mg/dL (ref 1.7–2.4)

## 2023-05-17 LAB — CBC
HCT: 25 % — ABNORMAL LOW (ref 36.0–46.0)
Hemoglobin: 8 g/dL — ABNORMAL LOW (ref 12.0–15.0)
MCH: 32 pg (ref 26.0–34.0)
MCHC: 32 g/dL (ref 30.0–36.0)
MCV: 100 fL (ref 80.0–100.0)
Platelets: 245 10*3/uL (ref 150–400)
RBC: 2.5 MIL/uL — ABNORMAL LOW (ref 3.87–5.11)
RDW: 16.8 % — ABNORMAL HIGH (ref 11.5–15.5)
WBC: 5.9 10*3/uL (ref 4.0–10.5)
nRBC: 0 % (ref 0.0–0.2)

## 2023-05-17 NOTE — Progress Notes (Signed)
 Patricia Davies  [id number]:[id number] DOB: [birth year] DOA: 05/12/2023 PCP: [name], MD    Brief Narrative:  78 year old with a history of severe dementia, chronic sacral decubitus ulcer, generalized adult failure to thrive, sarcoidosis, COPD, HTN, and RLE DVT who was admitted to [facility] 2/20 > 05/12/2023 with sepsis due to pneumonia and was subsequently brought back to the ER with significant lethargy generalized weakness and recurrent fever to 102 after returning home.  In the ER she was found to have a temperature of 103.6.  CT of the pelvis revealed findings consistent with her known chronic sacral decubitus but worrisome for acute infection/fasciitis of the region.  The patient was seen by General Surgery in the ER who had a low suspicion for true fasciitis.  Goals of Care:   Code Status: Limited: Do not attempt resuscitation (DNR) -DNR-LIMITED -Do Not Intubate/DNI    DVT prophylaxis:  apixaban (ELIQUIS) tablet 5 mg   Interim Hx: Resting comfortably in bed without complaints.  Afebrile.  Vital signs stable.  Assessment & Plan:  Sepsis of unclear etiology -suspected aspiration pneumonitis  CT chest this admission noted resolution of right lower lobe infiltrate but new area of left lower lobe infiltrate -perhaps she suffered an isolated aspiration event -clinically she appears stable from a respiratory standpoint -sepsis physiology has resolved -would also be at risk for transient bacteremia related to her significant decubitus -blood cultures negative this admission  Chronic decubitus ulceration Wound care as per General Surgery and WOC - on empiric antibiotic presently  8 cm rectal stool ball/constipation Patient had a significant bowel movement documented 3/12 after a smog enema was administered - increased schedule bowel regimen - bowel movement documented again on 3/14  Hypokalemia Due to poor oral intake -corrected with supplementation  Chronic right lower extremity  DVT Continue usual Eliquis therapy  Failure to thrive in adult - protein calorie malnutrition  Sarcoidosis Severe fibrosis bilateral upper lung zones noted on CT chest  Presumed mycetoma in left apical lung cavity Dates back to at least 08/08/2016 -appears slightly enlarged on imaging this admission  Dementia with behavioral disturbance Pleasant and alert at the time of my visit today  Family Communication:  Disposition: Appears to be stabilizing - possible discharge eligibility over the weekend   Objective: Blood pressure (!) 108/55, pulse 88, temperature 98.1 F (36.7 C), temperature source Oral, resp. rate 17, height 5' (1.524 m), weight 39 kg, SpO2 100%.  Intake/Output Summary (Last 24 hours) at 05/17/2023 0955 Last data filed at 05/16/2023 2200 Gross per 24 hour  Intake 120 ml  Output --  Net 120 ml   Filed Weights   05/13/23 1519  Weight: 39 kg    Examination: General: No acute respiratory distress Lungs: good air movement th/o all fields B Cardiovascular: RRR without murmur appreciated Abdomen: NT/ND, soft, BS+ Extremities: No significant edema bilateral lower extremities  CBC: Recent Labs  Lab 05/12/23 2001 05/13/23 0457 05/15/23 0400 05/16/23 0618 05/17/23 0259  WBC 4.4 5.4 4.0 4.6 5.9  NEUTROABS 3.3 3.6  --   --   --   HGB 10.4* 8.2* 8.4* 8.0* 8.0*  HCT 32.4* 25.7* 25.8* 25.2* 25.0*  MCV 102.2* 102.4* 98.5 99.2 100.0  PLT 328 251 243 247 245   Basic Metabolic Panel: Recent Labs  Lab 05/11/23 0451 05/12/23 2001 05/13/23 0457 05/15/23 0400 05/16/23 0618 05/17/23 0259  NA 142   < > 133* 136 134*  --   K 4.1   < >  4.1 3.2* 3.7  --   CL 106   < > 103 99 101  --   CO2 28   < > 27 30 27   --   GLUCOSE 91   < > 111* 110* 86  --   BUN 16   < > 9 12 12   --   CREATININE <0.30*   < > 0.42* 0.48 0.57  --   CALCIUM 8.5*   < > 7.9* 8.5* 8.2*  --   MG 1.8  --   --   --  1.7 2.0   < > = values in this interval not displayed.   GFR: Estimated Creatinine  Clearance: 37.4 mL/min (by C-G formula based on SCr of 0.57 mg/dL).   Scheduled Meds:  amoxicillin-clavulanate  1 tablet Oral Q12H   apixaban  5 mg Oral BID   folic acid  1 mg Oral Daily   leptospermum manuka honey  1 Application Topical Daily   memantine  10 mg Oral BID   polyethylene glycol  17 g Oral BID   senna-docusate  2 tablet Oral BID     LOS: 4 days   Lonia Blood, MD [facility] Office  [phone] Pager - Text Page per Loretha Stapler  If 7PM-7AM, please contact night-coverage per Amion 05/17/2023, 9:55 AM

## 2023-05-17 NOTE — Progress Notes (Signed)
 [facility] [id number] -- [facility] Liaison Note:   This patient is currently enrolled in [facility] outpatient-based palliative care.   Hospital Liaison will continue to follow for discharge disposition.   Please call for any outpatient based palliative care related questions or concerns.   Thank you,   Roe Rutherford BSN, [facility] Liaison [phone]

## 2023-05-18 DIAGNOSIS — A419 Sepsis, unspecified organism: Secondary | ICD-10-CM | POA: Diagnosis not present

## 2023-05-18 DIAGNOSIS — L89154 Pressure ulcer of sacral region, stage 4: Secondary | ICD-10-CM | POA: Diagnosis not present

## 2023-05-18 NOTE — Progress Notes (Signed)
 Patricia Davies  NFA:[id number] DOB: Jul 15, 1945 DOA: 05/12/2023 PCP: [name], MD    Brief Narrative:  78 year old with a history of severe dementia, chronic sacral decubitus ulcer, generalized adult failure to thrive, sarcoidosis, COPD, HTN, and RLE DVT who was admitted to [facility] 2/20 > 05/12/2023 with sepsis due to pneumonia and was subsequently brought back to the ER with significant lethargy generalized weakness and recurrent fever to 102 after returning home.  In the ER she was found to have a temperature of 103.6.  CT of the pelvis revealed findings consistent with her known chronic sacral decubitus but worrisome for acute infection/fasciitis of the region.  The patient was seen by General Surgery in the ER who had a low suspicion for true fasciitis.  Goals of Care:   Code Status: Limited: Do not attempt resuscitation (DNR) -DNR-LIMITED -Do Not Intubate/DNI    DVT prophylaxis:  apixaban (ELIQUIS) tablet 5 mg   Interim Hx: No acute events recorded overnight.  Afebrile.  Vital signs stable.  Resting comfortably in bed.  Has no new complaints.  Assessment & Plan:  Sepsis of unclear etiology - suspected aspiration pneumonitis  CT chest this admission noted resolution of right lower lobe infiltrate but new area of left lower lobe infiltrate -perhaps she suffered an isolated aspiration event -clinically she appears stable from a respiratory standpoint -sepsis physiology has resolved -would also be at risk for transient bacteremia related to her significant decubitus -blood cultures negative this admission  Chronic decubitus ulceration Wound care as per General Surgery and WOC - on empiric antibiotic presently - will plan to complete 10 days of treatment  8 cm rectal stool ball/constipation Patient had a significant bowel movement documented 3/12 after a smog enema was administered - increased schedule bowel regimen - bowel movement documented again on  3/14  Hypokalemia Due to poor oral intake - corrected with supplementation  Chronic right lower extremity DVT Continue usual Eliquis therapy  Failure to thrive in adult - protein calorie malnutrition  Sarcoidosis Severe fibrosis bilateral upper lung zones noted on CT chest  Presumed mycetoma in left apical lung cavity Dates back to at least 08/08/2016 -appears slightly enlarged on imaging this admission  Dementia with behavioral disturbance Pleasant and alert at the time of my visit today  Family Communication: I spoke with the patient's son via telephone 3/16 Disposition: The patient is stabilized medically -her son feels quite overwhelmed trying to care for her at home -he will meet with the social worker 3/17 to discuss options for care   Objective: Blood pressure 115/81, pulse 95, temperature (!) 97.5 F (36.4 C), temperature source Oral, resp. rate 18, height 5' (1.524 m), weight 39 kg, SpO2 100%.  Intake/Output Summary (Last 24 hours) at 05/18/2023 0951 Last data filed at 05/18/2023 4696 Gross per 24 hour  Intake 240 ml  Output 270 ml  Net -30 ml   Filed Weights   05/13/23 1519  Weight: 39 kg    Examination: General: No acute respiratory distress Lungs: Clear to auscultation bilaterally Cardiovascular: RRR Abdomen: NT/ND, soft, BS+ Extremities: No significant edema bilateral lower extremities  CBC: Recent Labs  Lab 05/12/23 2001 05/13/23 0457 05/15/23 0400 05/16/23 0618 05/17/23 0259  WBC 4.4 5.4 4.0 4.6 5.9  NEUTROABS 3.3 3.6  --   --   --   HGB 10.4* 8.2* 8.4* 8.0* 8.0*  HCT 32.4* 25.7* 25.8* 25.2* 25.0*  MCV 102.2* 102.4* 98.5 99.2 100.0  PLT 328 251 243 247  245   Basic Metabolic Panel: Recent Labs  Lab 05/13/23 0457 05/15/23 0400 05/16/23 0618 05/17/23 0259  NA 133* 136 134*  --   K 4.1 3.2* 3.7  --   CL 103 99 101  --   CO2 27 30 27   --   GLUCOSE 111* 110* 86  --   BUN 9 12 12   --   CREATININE 0.42* 0.48 0.57  --   CALCIUM 7.9* 8.5*  8.2*  --   MG  --   --  1.7 2.0   GFR: Estimated Creatinine Clearance: 37.4 mL/min (by C-G formula based on SCr of 0.57 mg/dL).   Scheduled Meds:  amoxicillin-clavulanate  1 tablet Oral Q12H   apixaban  5 mg Oral BID   folic acid  1 mg Oral Daily   leptospermum manuka honey  1 Application Topical Daily   memantine  10 mg Oral BID   polyethylene glycol  17 g Oral BID   senna-docusate  2 tablet Oral BID     LOS: 5 days   Lonia Blood, MD [facility] Office  [phone] Pager - Text Page per Loretha Stapler  If 7PM-7AM, please contact night-coverage per Amion 05/18/2023, 9:51 AM

## 2023-05-18 NOTE — Plan of Care (Signed)

## 2023-05-19 DIAGNOSIS — L89154 Pressure ulcer of sacral region, stage 4: Secondary | ICD-10-CM | POA: Diagnosis not present

## 2023-05-19 DIAGNOSIS — A419 Sepsis, unspecified organism: Secondary | ICD-10-CM | POA: Diagnosis not present

## 2023-05-19 NOTE — Progress Notes (Signed)
 Patricia Davies  [id number] DOB: March 01, 1946 DOA: 05/12/2023 PCP: [name], MD    Brief Narrative:  78 year old with a history of severe dementia, chronic sacral decubitus ulcer, generalized adult failure to thrive, sarcoidosis, COPD, HTN, and RLE DVT who was admitted to [facility] 2/20 > 05/12/2023 with sepsis due to pneumonia and was subsequently brought back to the ER with significant lethargy generalized weakness and recurrent fever to 102 after returning home.  In the ER she was found to have a temperature of 103.6.  CT of the pelvis revealed findings consistent with her known chronic sacral decubitus but worrisome for acute infection/fasciitis of the region.  The patient was seen by General Surgery in the ER who had a low suspicion for true fasciitis.  Goals of Care:   Code Status: Limited: Do not attempt resuscitation (DNR) -DNR-LIMITED -Do Not Intubate/DNI    DVT prophylaxis:  apixaban (ELIQUIS) tablet 5 mg   Interim Hx: Afebrile.  Vital signs stable.  No acute events recorded overnight.  The patient is alert and very pleasant at the time of my visit.  She smiles and makes conversation.  She is in no apparent distress.  Assessment & Plan:  Sepsis of unclear etiology - suspected aspiration pneumonitis  CT chest this admission noted resolution of right lower lobe infiltrate but new area of left lower lobe infiltrate -perhaps she suffered an isolated aspiration event -clinically she appears stable from a respiratory standpoint -sepsis physiology has resolved -would also be at risk for transient bacteremia related to her significant decubitus -blood cultures negative this admission  Chronic decubitus ulceration Wound care as per General Surgery and WOC - on empiric antibiotic presently - will plan to complete 10 days of treatment  8 cm rectal stool ball/constipation Patient had a significant bowel movement documented after a smog enema was administered - increased  schedule bowel regimen -follow to assure patient having consistent bowel movement  Hypokalemia Due to poor oral intake - corrected with supplementation  Chronic right lower extremity DVT Continue usual Eliquis therapy  Failure to thrive in adult - protein calorie malnutrition  Sarcoidosis Severe fibrosis bilateral upper lung zones noted on CT chest  Presumed mycetoma in left apical lung cavity Dates back to at least 08/08/2016 -appears slightly enlarged on imaging this admission  Dementia with behavioral disturbance Pleasant and alert at the time of my visit today  Family Communication: I spoke with the patient's son via telephone 3/16 Disposition: The patient is stabilized medically -her son feels quite overwhelmed trying to care for her at home -he will meet with the social worker 3/17 to discuss options for care   Objective: Blood pressure 126/80, pulse 76, temperature 97.8 F (36.6 C), resp. rate 18, height 5' (1.524 m), weight 39 kg, SpO2 98%.  Intake/Output Summary (Last 24 hours) at 05/19/2023 0933 Last data filed at 05/19/2023 0700 Gross per 24 hour  Intake 480 ml  Output 550 ml  Net -70 ml   Filed Weights   05/13/23 1519  Weight: 39 kg    Examination: General: No acute respiratory distress Lungs: Clear to auscultation bilaterally Cardiovascular: RRR Abdomen: NT/ND, soft, BS+ Extremities: No significant edema bilateral lower extremities  CBC: Recent Labs  Lab 05/12/23 2001 05/13/23 0457 05/15/23 0400 05/16/23 0618 05/17/23 0259  WBC 4.4 5.4 4.0 4.6 5.9  NEUTROABS 3.3 3.6  --   --   --   HGB 10.4* 8.2* 8.4* 8.0* 8.0*  HCT 32.4* 25.7* 25.8* 25.2* 25.0*  MCV 102.2* 102.4* 98.5 99.2 100.0  PLT 328 251 243 247 245   Basic Metabolic Panel: Recent Labs  Lab 05/13/23 0457 05/15/23 0400 05/16/23 0618 05/17/23 0259  NA 133* 136 134*  --   K 4.1 3.2* 3.7  --   CL 103 99 101  --   CO2 27 30 27   --   GLUCOSE 111* 110* 86  --   BUN 9 12 12   --    CREATININE 0.42* 0.48 0.57  --   CALCIUM 7.9* 8.5* 8.2*  --   MG  --   --  1.7 2.0   GFR: Estimated Creatinine Clearance: 37.4 mL/min (by C-G formula based on SCr of 0.57 mg/dL).   Scheduled Meds:  amoxicillin-clavulanate  1 tablet Oral Q12H   apixaban  5 mg Oral BID   folic acid  1 mg Oral Daily   leptospermum manuka honey  1 Application Topical Daily   memantine  10 mg Oral BID   polyethylene glycol  17 g Oral BID   senna-docusate  2 tablet Oral BID     LOS: 6 days   Lonia Blood, MD [facility] Office  [phone] Pager - Text Page per Loretha Stapler  If 7PM-7AM, please contact night-coverage per Amion 05/19/2023, 9:33 AM

## 2023-05-19 NOTE — Care Management Important Message (Signed)
 Important Message  Patient Details  Name: Patricia Davies MRN: [id number] Date of Birth: 04-02-1945   Important Message Given:  Yes - Medicare IM     Dorena Bodo 05/19/2023, 2:45 PM

## 2023-05-20 DIAGNOSIS — L89154 Pressure ulcer of sacral region, stage 4: Secondary | ICD-10-CM | POA: Diagnosis not present

## 2023-05-20 DIAGNOSIS — A419 Sepsis, unspecified organism: Secondary | ICD-10-CM | POA: Diagnosis not present

## 2023-05-20 NOTE — Plan of Care (Signed)

## 2023-05-20 NOTE — Plan of Care (Signed)

## 2023-05-20 NOTE — TOC Progression Note (Signed)
 Transition of Care [facility]) - Progression Note    Patient Details  Name: Patricia Davies MRN: [id number] Date of Birth: 1945-09-14  Transition of Care [facility]) CM/SW Contact  Marliss Coots, LCSW Phone Number: 05/20/2023, 3:25 PM  Clinical Narrative:     3:25 PM Per RNCM, patient's son expressed interest in patient discharge to SNF. Medical team made aware. CSW attempted to call patient's son regarding SNF, but there was no response and a voicemail was left.  Expected Discharge Plan: Skilled Nursing Facility Barriers to Discharge: Continued Medical Work up  Expected Discharge Plan and Services In-house Referral: Clinical Social Work     Living arrangements for the past 2 months: Single Family Home                                       Social Determinants of Health (SDOH) Interventions SDOH Screenings   Food Insecurity: No Food Insecurity (05/13/2023)  Housing: Low Risk  (05/13/2023)  Transportation Needs: No Transportation Needs (05/13/2023)  Utilities: Not At Risk (05/13/2023)  Alcohol Screen: Low Risk  (12/06/2021)  Depression (PHQ2-9): Low Risk  (12/06/2021)  Financial Resource Strain: Low Risk  (12/06/2021)  Physical Activity: Inactive (12/06/2021)  Social Connections: Socially Isolated (05/13/2023)  Stress: No Stress Concern Present (12/06/2021)  Tobacco Use: Medium Risk (05/12/2023)    Readmission Risk Interventions     No data to display

## 2023-05-20 NOTE — Progress Notes (Signed)
 Occupational Therapy Treatment Patient Details Name: Patricia Davies MRN: [id number] DOB: June 12, 1945 Today's Date: 05/20/2023   History of present illness Pt is a 78 yo female presenting to [facility] ED for significant lethargy, weakness, and fever being worked up for sepsis. Pt was recently admitted to [facility] 2/20-3/12/2023 with sepsis due to pneumonia. PMH of demenia,  chronic sacral decubitus ulcer, generalized adult failure to thrive, sarcoidosis, COPD, HTN, and RLE DVT   OT comments  Once set up, patient was able to feed self with min A.  Tended to use fingers, but could use fork correctly.  Patient did better with set up in the left visual fields.  Patient will benefit from continued inpatient follow up therapy, <3 hours/day and continued OT on acute       If plan is discharge home, recommend the following:  Two people to help with walking and/or transfers;A lot of help with bathing/dressing/bathroom;Assistance with cooking/housework;Assist for transportation;Supervision due to cognitive status;Direct supervision/assist for medications management;Direct supervision/assist for financial management;Help with stairs or ramp for entrance   Equipment Recommendations  None recommended by OT    Recommendations for Other Services      Precautions / Restrictions Precautions Precautions: Fall Recall of Precautions/Restrictions: Impaired Precaution/Restrictions Comments: sacral wounds Restrictions Weight Bearing Restrictions Per Provider Order: No       Mobility Bed Mobility                    Transfers                         Balance                                           ADL either performed or assessed with clinical judgement   ADL Overall ADL's : Needs assistance/impaired Eating/Feeding: Minimal assistance (Initially patient stated she was not hungry.  Set tray up. Pt. in bed with BLE flexed and pulled to her R.  Once tray set up and  food choices reviewed, pt. fed self with LUE using fork and also some with L hand.)                                          Extremity/Trunk Assessment Upper Extremity Assessment Upper Extremity Assessment: Generalized weakness;RUE deficits/detail RUE Deficits / Details: Moderate tone in RUE - unable to bring through full ROM.  pt. able to activate grip in r hand.  UE is non- fucntional            Vision   Additional Comments: During feeding activity patient needed food place on L side to acknowledge   Perception     Praxis     Communication Communication Communication: No apparent difficulties   Cognition Arousal: Alert Behavior During Therapy: [facility] for tasks assessed/performed Cognition: No family/caregiver present to determine baseline, History of cognitive impairments (per chart patient could feed herself)                               Following commands: Intact        Cueing      Exercises      Shoulder Instructions  General Comments Family in the room and discussed disposition    Pertinent Vitals/ Pain       Pain Assessment Pain Assessment: Faces Pain Score: 0-No pain  Home Living                                          Prior Functioning/Environment              Frequency  Min 1X/week        Progress Toward Goals  OT Goals(current goals can now be found in the care plan section)  Progress towards OT goals: Progressing toward goals  Acute Rehab OT Goals Time For Goal Achievement: 05/29/23 Potential to Achieve Goals: Fair ADL Goals Pt Will Perform Eating: with set-up;sitting Pt Will Perform Grooming: with set-up;sitting Pt Will Transfer to Toilet: with +2 assist;with mod assist;stand pivot transfer;bedside commode Additional ADL Goal #3: Pt will tolerate >10 mins sitting EOB in preparation for tolerance to sit in recliner  Plan      Co-evaluation                 AM-PAC OT  "6 Clicks" Daily Activity     Outcome Measure   Help from another person eating meals?: A Little Help from another person taking care of personal grooming?: A Little Help from another person toileting, which includes using toliet, bedpan, or urinal?: A Lot Help from another person bathing (including washing, rinsing, drying)?: A Lot Help from another person to put on and taking off regular upper body clothing?: A Lot Help from another person to put on and taking off regular lower body clothing?: Total 6 Click Score: 13    End of Session    OT Visit Diagnosis: Unsteadiness on feet (R26.81);Other abnormalities of gait and mobility (R26.89);Muscle weakness (generalized) (M62.81)   Activity Tolerance Patient tolerated treatment well   Patient Left in bed;with call bell/phone within reach;with bed alarm set   Nurse Communication Mobility status        Time: 1347-1410 OT Time Calculation (min): 23 min  Charges: OT General Charges $OT Visit: 1 Visit OT Treatments $Self Care/Home Management : 23-37 mins  Hal Neer OTR/L   Patricia Davies 05/20/2023, 2:23 PM

## 2023-05-20 NOTE — Progress Notes (Addendum)
 Patricia Davies  ZOX:[id number] DOB: 1945-11-22 DOA: 05/12/2023 PCP: [name], MD    Brief Narrative:  78 year old with a history of severe dementia, chronic sacral decubitus ulcer, generalized adult failure to thrive, sarcoidosis, COPD, HTN, and RLE DVT who was admitted to [facility] 2/20 > 05/12/2023 with sepsis due to pneumonia and was subsequently brought back to the ER with significant lethargy generalized weakness and recurrent fever to 102 after returning home.  In the ER she was found to have a temperature of 103.6.  CT of the pelvis revealed findings consistent with her known chronic sacral decubitus but worrisome for acute infection/fasciitis of the region.  The patient was seen by General Surgery in the ER who had a low suspicion for true fasciitis.  Goals of Care:   Code Status: Limited: Do not attempt resuscitation (DNR) -DNR-LIMITED -Do Not Intubate/DNI    DVT prophylaxis:  apixaban (ELIQUIS) tablet 5 mg   Interim Hx: No acute events reported overnight.  Afebrile.  Vital signs stable. Resting comfortably in bed.   Assessment & Plan:  Sepsis of unclear etiology - suspect aspiration pneumonitis  CT chest this admission noted resolution of right lower lobe infiltrate but new area of left lower lobe infiltrate - perhaps she suffered an isolated aspiration event - clinically she appears stable from a respiratory standpoint -sepsis physiology has resolved -would also be at risk for transient bacteremia related to her significant decubitus - blood cultures negative this admission  Chronic decubitus ulceration Wound care as per General Surgery and WOC - on empiric antibiotic presently - will plan to complete 10 days of treatment  8 cm rectal stool ball/constipation Patient had a significant bowel movement documented after a smog enema was administered - increased schedule bowel regimen -follow to assure patient having consistent bowel movement  Hypokalemia Due to poor  oral intake - corrected with supplementation  Chronic right lower extremity DVT Continue usual Eliquis therapy  Failure to thrive in adult - protein calorie malnutrition  Sarcoidosis Severe fibrosis bilateral upper lung zones noted on CT chest  Presumed mycetoma in left apical lung cavity Dates back to at least 08/08/2016 -appears slightly enlarged on imaging this admission  Dementia with behavioral disturbance Pleasant and alert at the time of my visit today  Family Communication: Left voicemail for patient's son giving him an update 3/18 Disposition: The patient is stabilized medically -her son feels quite overwhelmed trying to care for her at home -he will meet with the social worker 3/18 to discuss options for care - SNF placement would be ideal   Objective: Blood pressure (!) 114/95, pulse 82, temperature 97.7 F (36.5 C), temperature source Oral, resp. rate 18, height 5' (1.524 m), weight 39 kg, SpO2 100%.  Intake/Output Summary (Last 24 hours) at 05/20/2023 1002 Last data filed at 05/20/2023 0655 Gross per 24 hour  Intake --  Output 1 ml  Net -1 ml   Filed Weights   05/13/23 1519  Weight: 39 kg    Examination: General: No acute respiratory distress Lungs: Clear to auscultation bilaterally Cardiovascular: RRR Abdomen: NT/ND, soft, BS+ Extremities: No significant edema B LE   CBC: Recent Labs  Lab 05/15/23 0400 05/16/23 0618 05/17/23 0259  WBC 4.0 4.6 5.9  HGB 8.4* 8.0* 8.0*  HCT 25.8* 25.2* 25.0*  MCV 98.5 99.2 100.0  PLT 243 247 245   Basic Metabolic Panel: Recent Labs  Lab 05/15/23 0400 05/16/23 0618 05/17/23 0259  NA 136 134*  --  K 3.2* 3.7  --   CL 99 101  --   CO2 30 27  --   GLUCOSE 110* 86  --   BUN 12 12  --   CREATININE 0.48 0.57  --   CALCIUM 8.5* 8.2*  --   MG  --  1.7 2.0   GFR: Estimated Creatinine Clearance: 37.4 mL/min (by C-G formula based on SCr of 0.57 mg/dL).   Scheduled Meds:  amoxicillin-clavulanate  1 tablet Oral  Q12H   apixaban  5 mg Oral BID   folic acid  1 mg Oral Daily   leptospermum manuka honey  1 Application Topical Daily   memantine  10 mg Oral BID   polyethylene glycol  17 g Oral BID   senna-docusate  2 tablet Oral BID     LOS: 7 days   Lonia Blood, MD [facility] Office  [phone] Pager - Text Page per Loretha Stapler  If 7PM-7AM, please contact night-coverage per Amion 05/20/2023, 10:02 AM

## 2023-05-20 NOTE — TOC Initial Note (Signed)
 Transition of Care [facility]) - Initial/Assessment Note    Patient Details  Name: Patricia Davies MRN: [id number] Date of Birth: 10/16/1945  Transition of Care [facility]) CM/SW Contact:    Marliss Coots, LCSW Phone Number: 05/20/2023, 8:49 AM  Clinical Narrative:                  8:50 AM Per chart review, patient has SDOH needs (social connections). CSW added resources to patient's AVS.  Expected Discharge Plan: Home/Self Care Barriers to Discharge: Continued Medical Work up   Patient Goals and CMS Choice            Expected Discharge Plan and Services       Living arrangements for the past 2 months: Single Family Home                                      Prior Living Arrangements/Services Living arrangements for the past 2 months: Single Family Home Lives with:: Adult Children Patient language and need for interpreter reviewed:: Yes              Criminal Activity/Legal Involvement Pertinent to Current Situation/Hospitalization: No - Comment as needed  Activities of Daily Living   ADL Screening (condition at time of admission) Independently performs ADLs?: No Does the patient have a NEW difficulty with bathing/dressing/toileting/self-feeding that is expected to last >3 days?: No (needs assist) Does the patient have a NEW difficulty with getting in/out of bed, walking, or climbing stairs that is expected to last >3 days?: No (needs assist) Does the patient have a NEW difficulty with communication that is expected to last >3 days?: No Is the patient deaf or have difficulty hearing?: No Does the patient have difficulty seeing, even when wearing glasses/contacts?: Yes Does the patient have difficulty concentrating, remembering, or making decisions?: Yes  Permission Sought/Granted Permission sought to share information with : Family Supports Permission granted to share information with : No (Contact information on chart)  Share Information with NAME: Deliliah Spranger     Permission granted to share info w Relationship: Son  Permission granted to share info w Contact Information: [phone]  Emotional Assessment       Orientation: : Oriented to Self, Oriented to Place, Oriented to  Time, Oriented to Situation Alcohol / Substance Use: Not Applicable Psych Involvement: No (comment)  Admission diagnosis:  Sacral decubitus ulcer, stage IV (HCC) [L89.154] Sepsis (HCC) [A41.9] Sepsis without acute organ dysfunction, due to unspecified organism [facility]) [A41.9] Patient Active Problem List   Diagnosis Date Noted   Sacral decubitus ulcer, stage IV (HCC) 05/16/2023   Sepsis (HCC) 05/13/2023   Need for emotional support 04/30/2023   Counseling and coordination of care 04/30/2023   Goals of care, counseling/discussion 04/30/2023   Palliative care encounter 04/30/2023   Shock (HCC) 04/24/2023   Hypovolemia 04/24/2023   Failure to thrive in adult 04/24/2023   Chronic hypokalemia 08/22/2021   Dementia with behavioral disturbance (HCC) 08/22/2021   Protein-calorie malnutrition, moderate (HCC) 11/11/2017   Routine general medical examination at a health care facility 11/11/2017   Severe dementia without behavioral disturbance, psychotic disturbance, mood disturbance, or anxiety (HCC) 12/02/2016   Mycetoma, unspecified 08/08/2016   Hyperlipidemia LDL goal <130 08/07/2016   Dietary folate deficiency anemia 06/01/2015   Vitamin B12 deficiency neuropathy (HCC) 09/15/2014   COPD mixed type (HCC) 04/05/2011   Sarcoidosis 03/16/2010   Essential hypertension 03/16/2010  PCP:  [name], MD Pharmacy:   [facility] [phone] [place] Otto, [place] - [address] [place] [address] Phone: [phone] Fax: [phone]  [facility] [address] [place] - [address] AT [facility] [address] [place] [address] Phone: [phone] Fax: [phone]  [place] [facility] [address] [place] [address] Phone: [phone] Fax: [phone]     Social Drivers of Health (SDOH) Social History: SDOH Screenings   Food Insecurity: No Food Insecurity (05/13/2023)  Housing: Low Risk  (05/13/2023)  Transportation Needs: No Transportation Needs (05/13/2023)  Utilities: Not At Risk (05/13/2023)  Alcohol Screen: Low Risk  (12/06/2021)  Depression (PHQ2-9): Low Risk  (12/06/2021)  Financial Resource Strain: Low Risk  (12/06/2021)  Physical Activity: Inactive (12/06/2021)  Social Connections: Socially Isolated (05/13/2023)  Stress: No Stress Concern Present (12/06/2021)  Tobacco Use: Medium Risk (05/12/2023)   SDOH Interventions:     Readmission Risk Interventions     No data to display

## 2023-05-20 NOTE — Discharge Instructions (Signed)
 To address social isolation:  Education officer, [employer] [place]: [phone] / [address], [place], [place] [address]  -Dial 988: Talk lifeline 24/7.  -[facility] Network Warmline: [phone]  -[facility] (Adult Program)  - Address: [address] [place], [place] [address] - General office #:  [phone] - Enrollment Phone #: [phone]  -[facility]  - [facility] Line: call toll free, available 24 hours a day, at [phone]   [place] [address]

## 2023-05-20 NOTE — Progress Notes (Signed)
 Physical Therapy Treatment Patient Details Name: Patricia Davies MRN: [id number] DOB: [birth year] Today's Date: 05/20/2023   History of Present Illness Pt is a 78 yo female presenting to [facility] ED for significant lethargy, weakness, and fever being worked up for sepsis. Pt was recently admitted to [facility] 2/20-3/12/2023 with sepsis due to pneumonia. PMH of demenia,  chronic sacral decubitus ulcer, generalized adult failure to thrive, sarcoidosis, COPD, HTN, and RLE DVT    PT Comments  Patient progressing some with sitting balance during session and with activity tolerance.  Attempted sit to stand, though pt fearful and pushing back so deferred OOB today in addition to sacral ulcer.  She was incontinent of stool while upright so assisted for hygiene.  Son present and states pt was self feeding at home and able to get up to walk to sit on the couch.  Seems to have a decline in function and may benefit from continued skilled PT in the acute setting and follow up inpatient rehab (<3 hours/day) at d/c.    If plan is discharge home, recommend the following: A lot of help with bathing/dressing/bathroom;A lot of help with walking and/or transfers;Assistance with cooking/housework;Assist for transportation;Help with stairs or ramp for entrance;Direct supervision/assist for medications management   Can travel by private vehicle     No  Equipment Recommendations  None recommended by PT    Recommendations for Other Services       Precautions / Restrictions Precautions Precautions: Fall Precaution/Restrictions Comments: sacral wounds     Mobility  Bed Mobility Overal bed mobility: Needs Assistance Bed Mobility: Sit to Supine, Sidelying to Sit, Rolling Rolling: Mod assist Sidelying to sit: Max assist   Sit to supine: Mod assist   General bed mobility comments: assist to roll as c/o L hip pain when attempting to move to EOB, assist for legs off EOB and to lift trunk, assist to supine for  legs onto bed and positioning, rolled for hygiene due to BM    Transfers Overall transfer level: Needs assistance   Transfers: Sit to/from Stand Sit to Stand: +2 physical assistance, Total assist           General transfer comment: when initiating sit to stand pt leaning back and sliding R foot forward assist to sit and reposition for safety; deferred further transfer    Ambulation/Gait                   Stairs             Wheelchair Mobility     Tilt Bed    Modified Rankin (Stroke Patients Only)       Balance Overall balance assessment: Needs assistance Sitting-balance support: Feet supported Sitting balance-Leahy Scale: Poor Sitting balance - Comments: leaning posterior at times and to L on EOB, able to sit with close S for couple minutes, though mod A at times                                    Communication Communication Communication: No apparent difficulties  Cognition Arousal: Alert Behavior During Therapy: WFL for tasks assessed/performed   PT - Cognitive impairments: History of cognitive impairments                         Following commands: Intact      Cueing Cueing Techniques: Verbal cues, Tactile cues  Exercises  General Comments General comments (skin integrity, edema, etc.): Family in the room and discussed disposition      Pertinent Vitals/Pain Pain Assessment Pain Assessment: Faces Faces Pain Scale: Hurts even more Pain Location: R hip/knee with movement Pain Descriptors / Indicators: Grimacing, Guarding Pain Intervention(s): Monitored during session, Repositioned, Limited activity within patient's tolerance    Home Living                          Prior Function            PT Goals (current goals can now be found in the care plan section) Progress towards PT goals: Progressing toward goals    Frequency    Min 1X/week      PT Plan      Co-evaluation               AM-PAC PT "6 Clicks" Mobility   Outcome Measure  Help needed turning from your back to your side while in a flat bed without using bedrails?: A Lot Help needed moving from lying on your back to sitting on the side of a flat bed without using bedrails?: A Lot Help needed moving to and from a bed to a chair (including a wheelchair)?: Total Help needed standing up from a chair using your arms (e.g., wheelchair or bedside chair)?: Total Help needed to walk in hospital room?: Total Help needed climbing 3-5 steps with a railing? : Total 6 Click Score: 8    End of Session Equipment Utilized During Treatment: Gait belt Activity Tolerance: Patient limited by fatigue;Patient limited by pain Patient left: in bed;with call bell/phone within reach;with family/visitor present   PT Visit Diagnosis: Muscle weakness (generalized) (M62.81);Difficulty in walking, not elsewhere classified (R26.2)     Time: 1610-9604 PT Time Calculation (min) (ACUTE ONLY): 32 min  Charges:    $Therapeutic Activity: 23-37 mins PT General Charges $$ ACUTE PT VISIT: 1 Visit                     Sheran Lawless, PT Acute Rehabilitation Services Office:[phone] 05/20/2023    Elray Mcgregor 05/20/2023, 12:19 PM

## 2023-05-20 NOTE — TOC Progression Note (Signed)
 Transition of Care [facility]) - Progression Note    Patient Details  Name: Patricia Davies MRN: [id number] Date of Birth: 05-03-1945  [facility]) CM/SW Contact  Tom-Johnson, Hershal Coria, RN Phone Number: 05/20/2023, 10:01 AM  Clinical Narrative:     CM spoke with patient's son Chrissie Noa about Discharge disposition. Chrissie Noa voiced his concern about patient returning home. States patient was living with her daughter prior moving in with him a week prior to patient's admit. States his sister is not cooperative and he works so he will not be able to assist with care at all times. LCSW, PT/OT and MD notified.   CM will continue to follow as patient progresses towards discharge.        Expected Discharge Plan: Home/Self Care Barriers to Discharge: Continued Medical Work up  Expected Discharge Plan and Services       Living arrangements for the past 2 months: Single Family Home                                       Social Determinants of Health (SDOH) Interventions SDOH Screenings   Food Insecurity: No Food Insecurity (05/13/2023)  Housing: Low Risk  (05/13/2023)  Transportation Needs: No Transportation Needs (05/13/2023)  Utilities: Not At Risk (05/13/2023)  Alcohol Screen: Low Risk  (12/06/2021)  Depression (PHQ2-9): Low Risk  (12/06/2021)  Financial Resource Strain: Low Risk  (12/06/2021)  Physical Activity: Inactive (12/06/2021)  Social Connections: Socially Isolated (05/13/2023)  Stress: No Stress Concern Present (12/06/2021)  Tobacco Use: Medium Risk (05/12/2023)    Readmission Risk Interventions     No data to display

## 2023-05-21 DIAGNOSIS — A419 Sepsis, unspecified organism: Secondary | ICD-10-CM | POA: Diagnosis not present

## 2023-05-21 NOTE — H&P (Signed)
 PCP:   [name], MD    Chief Complaint:  Fever, weakness   HPI: This is a 78 year old female with past medical history significant for severe dementia, sacral decubitus, FTT, sarcoidosis, COPD, HTN, RLE DVT.  She was recently admitted 2/20 - 05/12/2023 with sepsis secondary to pneumonia.  Patient treated and discharged yesterday.  Since being discharged patient has been lethargic, generalized weakness, febrile with temperatures up to 102 at home.  She is brought back to the ER.   In the ER patient's Tmax 103.6.  3 L nasal cannula.  Lactic acid 3.4 => 3.1.  Blood cultures x 2 collected CT Pelvis: Midline lower back and gluteal subcutaneus soft tissue edema and emphysema. Gas extends to the soft tissues along the sacrum. A necrotizing fasciitis cannot be excluded as this is a clinical diagnosis Constipation. 8 cm rectal stool ball.  CXR: Largely chronic lung disease with biapical pleuroparenchymal scarring and areas of distortion and fibrosis. Left apical density slightly progressive compared to more remote exams and could be secondary to progressive scarring, superimposed infection or developing mass and chest CT follow-up was previously recommended.   IV antibiotics cefepime and vancomycin given.  EDP contacted Dr Hillery Hunter who will see patient.     Review of Systems:  Per HPI   Past Medical History:     Past Medical History:  Diagnosis Date   COPD (chronic obstructive pulmonary disease) (HCC)     History of nephrolithiasis     Hypertension     Sarcoidosis of skin     Vitamin B12 deficiency               Past Surgical History:  Procedure Laterality Date   ABDOMINAL HYSTERECTOMY       CHOLECYSTECTOMY              Medications:        Prior to Admission medications   Medication Sig Start Date End Date Taking? Authorizing Provider  acetaminophen (TYLENOL) 325 MG tablet Take 650 mg by mouth every 6 (six) hours as needed.       [provider]  APIXABAN Everlene Balls)  VTE STARTER PACK (10MG  AND 5MG ) Take as directed on package: start with two-5mg  tablets twice daily for 7 days. On day 8, switch to one-5mg  tablet twice daily. 05/06/23     Briant Cedar, MD  feeding supplement (ENSURE ENLIVE / ENSURE PLUS) LIQD Take 237 mLs by mouth 2 (two) times daily between meals. 05/06/23     Briant Cedar, MD  folic acid (FOLVITE) 1 MG tablet Take 1 tablet (1 mg total) by mouth daily. 05/06/23 06/11/23   Briant Cedar, MD  leptospermum manuka honey (MEDIHONEY) PSTE paste Apply 1 Application topically at bedtime. 05/12/23     Deanna Artis, DO  memantine (NAMENDA) 10 MG tablet Take 1 tablet (10 mg at night) for 2 weeks, then increase to 1 tablet (10 mg) twice a day Patient taking differently: Take 10 mg by mouth See admin instructions. Take 1 tablet (10 mg at night) for 2 weeks, then increase to 1 tablet (10 mg) twice a day 09/07/21     Marcos Eke, PA-C  polyethylene glycol powder (GLYCOLAX/MIRALAX) 17 GM/SCOOP powder Take 17 g by mouth daily. 05/06/23     Briant Cedar, MD  traMADol (ULTRAM) 50 MG tablet Take 1 tablet (50 mg total) by mouth every 12 (twelve) hours as needed for up to 7 days for moderate pain (pain score  4-6). 05/06/23 05/19/23   Briant Cedar, MD      Allergies:   Allergies  No Known Allergies     Social History:  reports that she quit smoking about 33 years ago. Her smoking use included cigarettes. She started smoking about 53 years ago. She has a 10 pack-year smoking history. She has never used smokeless tobacco. She reports that she does not currently use alcohol after a past usage of about 1.0 standard drink of alcohol per week. She reports that she does not use drugs.   Family History:      Family History  Problem Relation Age of Onset   Hypertension Maternal Aunt     Diabetes Maternal Aunt            Physical Exam:       Vitals:    05/12/23 2319 05/13/23 0000 05/13/23 0017 05/13/23 0019  BP:   124/67 122/77     Pulse:          Resp: 16 16   17   Temp:          TempSrc:          SpO2:              General: Alert, slender, no acute distress Eyes: Pink conjunctiva, no scleral icterus ENT: Moist oral mucosa, neck supple, no thyromegaly Lungs: CTA B/L, no wheeze, no crackles, no use of accessory muscles Cardiovascular: RRR, no murmurs, no JVD Abdomen: soft, positive BS, NTND, not an acute abdomen GU: not examined Neuro: CN II - XII appears grossly intact Musculoskeletal: Moves all extremities Skin: Large wound bilateral hips.  No drainage.  No fluctuance.  No erythema.  Sacral decub Psych: appropriate patient     Labs on Admission:  Recent Labs (last 2 labs)      Recent Labs    05/11/23 0451 05/12/23 2001  NA 142 137  K 4.1 4.3  CL 106 99  CO2 28 29  GLUCOSE 91 141*  BUN 16 15  CREATININE <0.30* 0.63  CALCIUM 8.5* 9.0  MG 1.8  --       Recent Labs (last 2 labs)     Recent Labs    05/12/23 2001  AST 30  ALT 23  ALKPHOS 51  BILITOT 0.5  PROT 7.0  ALBUMIN 2.7*        Recent Labs (last 2 labs)      Recent Labs    05/11/23 0451 05/12/23 2001  WBC 5.5 4.4  NEUTROABS  --  3.3  HGB 7.5* 10.4*  HCT 24.4* 32.4*  MCV 103.4* 102.2*  PLT 278 328        Micro Results:        Recent Results (from the past 240 hours)  Resp panel by RT-PCR (RSV, Flu A&B, Covid) Anterior Nasal Swab     Status: None    Collection Time: 05/12/23  8:01 PM    Specimen: Anterior Nasal Swab  Result Value Ref Range Status    SARS Coronavirus 2 by RT PCR NEGATIVE NEGATIVE Final    Influenza A by PCR NEGATIVE NEGATIVE Final    Influenza B by PCR NEGATIVE NEGATIVE Final      Comment: (NOTE) The Xpert Xpress SARS-CoV-2/FLU/RSV plus assay is intended as an aid in the diagnosis of influenza from Nasopharyngeal swab specimens and should not be used as a sole basis for treatment. Nasal washings and aspirates are unacceptable for Xpert Xpress SARS-CoV-2/FLU/RSV testing.   Fact Sheet  for  Patients: BloggerCourse.com   Fact Sheet for Healthcare Providers: SeriousBroker.it   This test is not yet approved or cleared by the [place] FDA and has been authorized for detection and/or diagnosis of SARS-CoV-2 by FDA under an Emergency Use Authorization (EUA). This EUA will remain in effect (meaning this test can be used) for the duration of the COVID-19 declaration under Section 564(b)(1) of the Act, 21 U.S.C. section 360bbb-3(b)(1), unless the authorization is terminated or revoked.        Resp Syncytial Virus by PCR NEGATIVE NEGATIVE Final      Comment: (NOTE) Fact Sheet for Patients: BloggerCourse.com   Fact Sheet for Healthcare Providers: SeriousBroker.it   This test is not yet approved or cleared by the [place] FDA and has been authorized for detection and/or diagnosis of SARS-CoV-2 by FDA under an Emergency Use Authorization (EUA). This EUA will remain in effect (meaning this test can be used) for the duration of the COVID-19 declaration under Section 564(b)(1) of the Act, 21 U.S.C. section 360bbb-3(b)(1), unless the authorization is terminated or revoked.   Performed at [facility], [address] [place], [place] [address]          Radiological Exams on Admission:  Imaging Results (Last 48 hours)  CT PELVIS W CONTRAST Result Date: 05/12/2023 CLINICAL DATA:  Soft tissue infection suspected, pelvis, xray done EXAM: CT PELVIS WITH CONTRAST TECHNIQUE: Multidetector CT imaging of the pelvis was performed using the standard protocol following the bolus administration of intravenous contrast. RADIATION DOSE REDUCTION: This exam was performed according to the departmental dose-optimization program which includes automated exposure control, adjustment of the mA and/or kV according to patient size and/or use of iterative reconstruction technique. CONTRAST:   75mL OMNIPAQUE IOHEXOL 350 MG/ML SOLN COMPARISON:  None Available. FINDINGS: Urinary Tract:  No abnormality visualized. Bowel:  Stool throughout the majority of the visualized colon. Vascular/Lymphatic: Atherosclerotic plaque. No pathologically enlarged lymph nodes. No significant vascular abnormality seen. Reproductive:  No mass or other significant abnormality Other: No intraperitoneal free fluid. No intraperitoneal free gas. No organized fluid collection. Musculoskeletal: Midline lower back and gluteal subcutaneus soft tissue edema and emphysema. Gas extends to the soft tissues along the sacrum. Right femoral head avascular necrosis. No acute displaced fracture. No acute displaced fracture or dislocation of the bilateral hips. No acute displaced fracture or diastasis of the bones of the pelvis. No cortical erosion or destruction. Grade 2 anterolisthesis of L4 on L5. Degenerative changes of visualized lower lumbar spine. IMPRESSION: 1. Midline lower back and gluteal subcutaneus soft tissue edema and emphysema. Gas extends to the soft tissues along the sacrum. A necrotizing fasciitis cannot be excluded as this is a clinical diagnosis. 2. No CT evidence of osteomyelitis. 3. Constipation.  8 cm rectal stool ball. 4. Grade 2 anterolisthesis of L4 on L5. 5.  Aortic Atherosclerosis (ICD10-I70.0). Electronically Signed   By: Tish Frederickson M.D.   On: 05/12/2023 23:38    DG Chest Port 1 View Result Date: 05/12/2023 CLINICAL DATA:  Possible sepsis EXAM: PORTABLE CHEST 1 VIEW COMPARISON:  05/02/2023, 07/22/2019 FINDINGS: Chronic pleural and parenchymal scarring at the left greater than right apex with elevation the hila and areas of architectural distortion. No acute airspace disease or effusion. Stable cardiomediastinal silhouette with aortic atherosclerosis. Left apical progressive airspace disease compared to more remote exams. IMPRESSION: 1. No active disease. 2. Largely chronic lung disease with biapical  pleuroparenchymal scarring and areas of distortion and fibrosis. Left apical density slightly  progressive compared to more remote exams and could be secondary to progressive scarring, superimposed infection or developing mass and chest CT follow-up was previously recommended. Electronically Signed   By: Jasmine Pang M.D.   On: 05/12/2023 23:05       Assessment/Plan Present on Admission:  Sepsis unclear etiology //  Lactic acidosis -Acute respiratory failure with hypoxia, patient not on home oxygen -Concern for pneumonia based on chest x-ray, CT chest with contrast ordered -Concern for cellulitis, decubitus ulcers, Surgery consulted -Blood cultures x 2 collected.  Wound care consult placed. -Surgery saw and evaluated patient.  Does not believe the patient has necrotizing fasciitis.  Surgery's input appreciated -Wound care consult placed.  Surgery recommends Medihoney to the sacrum and cover with a dressing. Change BID or when soiled.  -IV cefepime and vancomycin continued -IV fluid hydration.  Follow lactic acid curve -Tylenol as needed fever -Respiratory panel ordered -Optimize nutrition and avoid pressure to the area as able -Nebulizes as needed every 2 hours as needed.    8 cm rectal stool ball, constipation -MiraLAX twice daily, Dulcolax suppository -Given patient's many sacral and hip decub.  Will avoid oral diarrhea    RLE DVT -Continue Eliquis    Failure to thrive in adult  Protein-calorie malnutrition, moderate (HCC) -Add Ensure    h/o Sarcoidosis  COPD mixed type (HCC)  Dementia with behavioral disturbance (HCC) -

## 2023-05-21 NOTE — NC FL2 (Signed)
 [place] MEDICAID FL2 LEVEL OF CARE FORM     IDENTIFICATION  Patient Name: Patricia Davies Birthdate: Jun 01, 1945 Sex: female Admission Date (Current Location): 05/12/2023  [facility] and [place] Number:  Producer, television/film/video and Address:  [facility] [place], [address] [place], [place] [address]      Provider Number: [id number]  Attending Physician Name and Address:  Lorin Glass, MD  Relative Name and Phone Number:       Current Level of Care: Hospital Recommended Level of Care: Skilled Nursing Facility Prior Approval Number:    Date Approved/Denied:   PASRR Number: [id number]  Discharge Plan: SNF    Current Diagnoses: Patient Active Problem List   Diagnosis Date Noted   Sacral decubitus ulcer, stage IV (HCC) 05/16/2023   Sepsis (HCC) 05/13/2023   Need for emotional support 04/30/2023   Counseling and coordination of care 04/30/2023   Goals of care, counseling/discussion 04/30/2023   Palliative care encounter 04/30/2023   Shock (HCC) 04/24/2023   Hypovolemia 04/24/2023   Failure to thrive in adult 04/24/2023   Chronic hypokalemia 08/22/2021   Dementia with behavioral disturbance (HCC) 08/22/2021   Protein-calorie malnutrition, moderate (HCC) 11/11/2017   Routine general medical examination at a health care facility 11/11/2017   Severe dementia without behavioral disturbance, psychotic disturbance, mood disturbance, or anxiety (HCC) 12/02/2016   Mycetoma, unspecified 08/08/2016   Hyperlipidemia LDL goal <130 08/07/2016   Dietary folate deficiency anemia 06/01/2015   Vitamin B12 deficiency neuropathy (HCC) 09/15/2014   COPD mixed type (HCC) 04/05/2011   Sarcoidosis 03/16/2010   Essential hypertension 03/16/2010    Orientation RESPIRATION BLADDER Height & Weight     Self  O2 (2L nasal cannula) Incontinent Weight: 85 lb 15.7 oz (39 kg) Height:  5' (152.4 cm)  BEHAVIORAL SYMPTOMS/MOOD NEUROLOGICAL BOWEL NUTRITION STATUS      Incontinent  Diet (See dc summary)  AMBULATORY STATUS COMMUNICATION OF NEEDS Skin   Extensive Assist Verbally Other (Comment), PU Stage and Appropriate Care (Deep tissue injury on heel, toe, hip. Stage III on hip; unstageable on sacrum)                       Personal Care Assistance Level of Assistance  Bathing, Feeding, Dressing Bathing Assistance: Maximum assistance Feeding assistance: Limited assistance Dressing Assistance: Maximum assistance     Functional Limitations Info             SPECIAL CARE FACTORS FREQUENCY  PT (By licensed PT), OT (By licensed OT)     PT Frequency: 5x/week OT Frequency: 5x/week            Contractures Contractures Info: Not present    Additional Factors Info  Code Status, Allergies Code Status Info: DNR Allergies Info: NKA           Current Medications (05/21/2023):  This is the current hospital active medication list Current Facility-Administered Medications  Medication Dose Route Frequency Provider Last Rate Last Admin   acetaminophen (TYLENOL) tablet 650 mg  650 mg Oral Q6H PRN Crosley, Debby, MD       amoxicillin-clavulanate (AUGMENTIN) 875-125 MG per tablet 1 tablet  1 tablet Oral Q12H Lonia Blood, MD   1 tablet at 05/21/23 3664   apixaban (ELIQUIS) tablet 5 mg  5 mg Oral BID Gery Pray, MD   5 mg at 05/21/23 0813   folic acid (FOLVITE) tablet 1 mg  1 mg Oral Daily Gery Pray, MD   1  mg at 05/21/23 0813   leptospermum manuka honey (MEDIHONEY) paste 1 Application  1 Application Topical Daily Marlin Canary U, DO   1 Application at 05/21/23 0814   memantine (NAMENDA) tablet 10 mg  10 mg Oral BID Lonia Blood, MD   10 mg at 05/21/23 0814   polyethylene glycol (MIRALAX / GLYCOLAX) packet 17 g  17 g Oral BID Gery Pray, MD   17 g at 05/21/23 9147   senna-docusate (Senokot-S) tablet 2 tablet  2 tablet Oral BID Lonia Blood, MD   2 tablet at 05/21/23 8295     Discharge Medications: Please see discharge summary for  a list of discharge medications.  Relevant Imaging Results:  Relevant Lab Results:   Additional Information SSn: [id number]. [facility] Palliative following.  Mearl Latin, LCSW

## 2023-05-21 NOTE — TOC Progression Note (Addendum)
 Transition of Care [facility]) - Progression Note    Patient Details  Name: Patricia Davies MRN: [id number] Date of Birth: Dec 25, 1945  [facility]) CM/SW Contact  Mearl Latin, LCSW Phone Number: 05/21/2023, 2:18 PM  Clinical Narrative:    2pm-CSW left voicemail for patient's son. Awaiting response from [place] and [place]; they are checking patient's Medicaid eligibility.    4:58 PM-Son returned call (he works 3rd shift in [employer] here). He requested SNF placement for patient (not [facility]). He has obtained access to patient's bank account but can't access anything else. CSW provided instructions on how to petition for Guardianship. He will do that and requested help with the Medicaid process. CSW emailed [place] with Financial Counseling to see if she can assist him.   CSW informed him that we are waiting on a response from [place] and [place] as they are able to accept patient to transition into long term if her asset check comes back approved.     Expected Discharge Plan: Skilled Nursing Facility Barriers to Discharge: Continued Medical Work up  Expected Discharge Plan and Services In-house Referral: Clinical Social Work     Living arrangements for the past 2 months: Single Family Home                                       Social Determinants of Health (SDOH) Interventions SDOH Screenings   Food Insecurity: No Food Insecurity (05/13/2023)  Housing: Low Risk  (05/13/2023)  Transportation Needs: No Transportation Needs (05/13/2023)  Utilities: Not At Risk (05/13/2023)  Alcohol Screen: Low Risk  (12/06/2021)  Depression (PHQ2-9): Low Risk  (12/06/2021)  Financial Resource Strain: Low Risk  (12/06/2021)  Physical Activity: Inactive (12/06/2021)  Social Connections: Socially Isolated (05/13/2023)  Stress: No Stress Concern Present (12/06/2021)  Tobacco Use: Medium Risk (05/12/2023)    Readmission Risk Interventions     No data to display

## 2023-05-21 NOTE — Progress Notes (Signed)
 PROGRESS NOTE  Patricia Davies  DOB: 24-Jul-1945  PCP: [name], MD JJO:[id number]  DOA: 05/12/2023  LOS: 8 days  Hospital Day: 10  Brief narrative: Patricia Davies is a 78 y.o. female with PMH significant for severe dementia, chronic sacral decubitus ulcer, generalized weakness, adult failure to thrive, sarcoidosis, COPD, HTN, and RLE DVT Recently hospitalized at [facility] 2/20-3/12/2023 with sepsis due to pneumonia.  Soon after returning home, patient was brought back to ED with recurrent fever of 102 and significant lethargy.  In the ED, she was found to have a temperature of 103.6.   CT chest noted resolution of right lower lobe infiltrate but new area of left lower lobe infiltrate CT of the pelvis showed findings consistent with her known chronic sacral decubitus but worrisome for acute infection/fasciitis of the region.   Admitted to [facility] for sepsis secondary to aspiration pneumonia See below for details  Subjective: Patient was seen and examined this morning Elderly African-American female.  Propped up in bed.  Alert, awake.  No family at bedside Patient knows she is in the hospital  Assessment and plan: Sepsis POA Aspiration pneumonia CT chest this admission noted resolution of right lower lobe infiltrate but new area of left lower lobe infiltrate - perhaps she suffered an isolated aspiration event Respiratory status was stable Blood culture negative Completed a course of antibiotics with oral Augmentin today.  Chronic decubitus ulceration CT pelvis raised suspicion of acute infection. General surgery was consulted and rule out infection. Completed 10-day course of empiric antibiotics Wound care to continue  Constipation CT pelvis on admission showed 8 cm rectal stool ball.  Relieved after smoking enema. Currently on bowel regimen   Hypokalemia Potassium level improved with replacement. Recent Labs  Lab 05/15/23 0400 05/16/23 0618 05/17/23 0259  K  3.2* 3.7  --   MG  --  1.7 2.0   Chronic right lower extremity DVT Continue usual Eliquis therapy   Failure to thrive in adult protein calorie malnutrition   Sarcoidosis Severe fibrosis bilateral upper lung zones noted on CT chest Also has a presumed mycetoma in left apical lung cavity. Dates back to at least 08/08/2016 -appears slightly enlarged on imaging this admission.  No symptoms.   Dementia with behavioral disturbance Pleasant, cheerful.   Mobility: PT OT following  Goals of care   Code Status: Limited: Do not attempt resuscitation (DNR) -DNR-LIMITED -Do Not Intubate/DNI      DVT prophylaxis:   apixaban (ELIQUIS) tablet 5 mg   Antimicrobials: Completed a course of antibiotics today Fluid: None Consultants: Nephrology  Family Communication: None at bedside  Status: Inpatient Level of care:  Med-Surg   Patient is from: Home Anticipated d/c to: The patient is stabilized medically -her son feels quite overwhelmed trying to care for her at home.  SNF placement expected    Diet:  Diet Order             DIET FINGER FOODS Room service appropriate? Yes; Fluid consistency: Thin  Diet effective now                   Scheduled Meds:  amoxicillin-clavulanate  1 tablet Oral Q12H   apixaban  5 mg Oral BID   folic acid  1 mg Oral Daily   leptospermum manuka honey  1 Application Topical Daily   memantine  10 mg Oral BID   polyethylene glycol  17 g Oral BID   senna-docusate  2 tablet Oral BID  PRN meds: acetaminophen **OR** [DISCONTINUED] acetaminophen   Infusions:    Antimicrobials: Anti-infectives (From admission, onward)    Start     Dose/Rate Route Frequency Ordered Stop   05/15/23 1130  amoxicillin-clavulanate (AUGMENTIN) 875-125 MG per tablet 1 tablet        1 tablet Oral Every 12 hours 05/15/23 1038 05/21/23 2359   05/14/23 1000  vancomycin (VANCOCIN) IVPB 1000 mg/200 mL premix  Status:  Discontinued        1,000 mg 200 mL/hr over 60 Minutes  Intravenous Every 36 hours 05/13/23 0411 05/14/23 1715   05/13/23 0800  ceFEPIme (MAXIPIME) 2 g in sodium chloride 0.9 % 100 mL IVPB  Status:  Discontinued        2 g 200 mL/hr over 30 Minutes Intravenous Every 12 hours 05/13/23 0411 05/15/23 1038   05/12/23 2000  ceFEPIme (MAXIPIME) 2 g in sodium chloride 0.9 % 100 mL IVPB        2 g 200 mL/hr over 30 Minutes Intravenous  Once 05/12/23 1958 05/12/23 2049   05/12/23 2000  metroNIDAZOLE (FLAGYL) IVPB 500 mg        500 mg 100 mL/hr over 60 Minutes Intravenous  Once 05/12/23 1958 05/12/23 2210   05/12/23 2000  vancomycin (VANCOCIN) IVPB 1000 mg/200 mL premix        1,000 mg 200 mL/hr over 60 Minutes Intravenous  Once 05/12/23 1958 05/12/23 2311       Objective: Vitals:   05/21/23 0642 05/21/23 0846  BP: 122/65 124/66  Pulse: 79 78  Resp: 18 18  Temp: (!) 97.5 F (36.4 C) 98.1 F (36.7 C)  SpO2: 100% 100%    Intake/Output Summary (Last 24 hours) at 05/21/2023 1425 Last data filed at 05/21/2023 0200 Gross per 24 hour  Intake 100 ml  Output 0 ml  Net 100 ml   Filed Weights   05/13/23 1519  Weight: 39 kg   Weight change:  Body mass index is 16.79 kg/m.   Physical Exam: General exam: Pleasant, elderly thin built African-American female Skin: No rashes, lesions or ulcers. HEENT: Atraumatic, normocephalic, no obvious bleeding Lungs: Clear to auscultation bilaterally,  CVS: S1, S2, no murmur,   GI/Abd: Soft, nontender, nondistended, bowel sound present,   CNS: Alert, awake, oriented to place Psychiatry: Mood appropriate, cheerful demented Extremities: No pedal edema, no calf tenderness,   Data Review: I have personally reviewed the laboratory data and studies available.  F/u labs  Unresulted Labs (From admission, onward)     Start     Ordered   05/22/23 0500  Basic metabolic panel  Tomorrow morning,   R        05/21/23 1425   05/22/23 0500  CBC with Differential/Platelet  Tomorrow morning,   R        05/21/23 1425             Total time spent in review of labs and imaging, patient evaluation, formulation of plan, documentation and communication with family: 45 minutes  Signed, Lorin Glass, MD [facility] 05/21/2023

## 2023-05-21 NOTE — Plan of Care (Signed)

## 2023-05-22 LAB — BASIC METABOLIC PANEL
Anion gap: 4 — ABNORMAL LOW (ref 5–15)
BUN: 10 mg/dL (ref 8–23)
CO2: 31 mmol/L (ref 22–32)
Calcium: 8.1 mg/dL — ABNORMAL LOW (ref 8.9–10.3)
Chloride: 104 mmol/L (ref 98–111)
Creatinine, Ser: 0.38 mg/dL — ABNORMAL LOW (ref 0.44–1.00)
GFR, Estimated: >60 mL/min (ref >60)
Glucose, Bld: 79 mg/dL (ref 70–99)
Potassium: 3.2 mmol/L — ABNORMAL LOW (ref 3.5–5.1)
Sodium: 139 mmol/L (ref 135–145)

## 2023-05-22 LAB — CBC WITH DIFFERENTIAL/PLATELET
Abs Immature Granulocytes: 0 10*3/uL (ref 0.00–0.07)
Basophils Absolute: 0.1 10*3/uL (ref 0.0–0.1)
Basophils Relative: 1 %
Eosinophils Absolute: 0.1 10*3/uL (ref 0.0–0.5)
Eosinophils Relative: 1 %
HCT: 25.1 % — ABNORMAL LOW (ref 36.0–46.0)
Hemoglobin: 8 g/dL — ABNORMAL LOW (ref 12.0–15.0)
Lymphocytes Relative: 41 %
Lymphs Abs: 2.4 10*3/uL (ref 0.7–4.0)
MCH: 31.5 pg (ref 26.0–34.0)
MCHC: 31.9 g/dL (ref 30.0–36.0)
MCV: 98.8 fL (ref 80.0–100.0)
Monocytes Absolute: 0.9 10*3/uL (ref 0.1–1.0)
Monocytes Relative: 15 %
Neutro Abs: 2.4 10*3/uL (ref 1.7–7.7)
Neutrophils Relative %: 42 %
Platelets: 426 10*3/uL — ABNORMAL HIGH (ref 150–400)
RBC: 2.54 MIL/uL — ABNORMAL LOW (ref 3.87–5.11)
RDW: 17.3 % — ABNORMAL HIGH (ref 11.5–15.5)
WBC: 5.8 10*3/uL (ref 4.0–10.5)
nRBC: 0 % (ref 0.0–0.2)
nRBC: 0 /100{WBCs}

## 2023-05-22 MED ORDER — POTASSIUM CHLORIDE CRYS ER 20 MEQ PO TBCR
40.0000 meq | EXTENDED_RELEASE_TABLET | Freq: Once | ORAL | Status: AC
  Administered 2023-05-22: 40 meq via ORAL
  Filled 2023-05-22: qty 2

## 2023-05-22 NOTE — TOC Progression Note (Addendum)
 Transition of Care [facility]) - Progression Note    Patient Details  Name: Patricia Davies MRN: [id number] Date of Birth: May 08, 1945  Transition of Care [facility]) CM/SW Contact  Dellie Burns [place], [place] Phone Number: 05/22/2023, 11:34 AM  Clinical Narrative:   SNF bed offer received from [facility] [place]. Voicemail left for pt's son requesting return call. Will provide updates as available.   UPDATE 1415: Spoke to pt's son and provided SNF offers from [place]. Pt's son accepted offer. [facility] [employer] auth request submitted and received-- #[id number], valid 3/20-3/24. Confirmed with Grover Canavan at [place] they are prepared to admit pt beginning tomorrow. MD updated.   Dellie Burns, MSW, LCSW [phone] (coverage)      Expected Discharge Plan: Skilled Nursing Facility Barriers to Discharge: Continued Medical Work up  Expected Discharge Plan and Services In-house Referral: Clinical Social Work     Living arrangements for the past 2 months: Single Family Home                                       Social Determinants of Health (SDOH) Interventions SDOH Screenings   Food Insecurity: No Food Insecurity (05/13/2023)  Housing: Low Risk  (05/13/2023)  Transportation Needs: No Transportation Needs (05/13/2023)  Utilities: Not At Risk (05/13/2023)  Alcohol Screen: Low Risk  (12/06/2021)  Depression (PHQ2-9): Low Risk  (12/06/2021)  Financial Resource Strain: Low Risk  (12/06/2021)  Physical Activity: Inactive (12/06/2021)  Social Connections: Socially Isolated (05/13/2023)  Stress: No Stress Concern Present (12/06/2021)  Tobacco Use: Medium Risk (05/12/2023)    Readmission Risk Interventions     No data to display

## 2023-05-22 NOTE — Progress Notes (Signed)
 Physical Therapy Treatment Patient Details Name: Patricia Davies MRN: [id number] DOB: 26-Sep-1945 Today's Date: 05/22/2023   History of Present Illness Pt is a 78 yo female presenting to [facility] ED for significant lethargy, weakness, and fever being worked up for sepsis. Pt was recently admitted to [facility] 2/20-3/12/2023 with sepsis due to pneumonia. PMH of demenia,  chronic sacral decubitus ulcer, generalized adult failure to thrive, sarcoidosis, COPD, HTN, and RLE DVT    PT Comments  Patient progressing this session able to stand x 3 and initiating more.  Still with posterior bias and unable to fully stand erect or for more than 10 seconds.  She demonstrated awareness of therapy and participated well.  Continue to feel she will benefit from post-acute inpatient rehab(<3 hours/day) for continued progression with mobility.  Feel she will need air mattress given sacral wound and multiple bony areas.  PT to follow.     If plan is discharge home, recommend the following: A lot of help with bathing/dressing/bathroom;A lot of help with walking and/or transfers;Assistance with cooking/housework;Assist for transportation;Help with stairs or ramp for entrance;Direct supervision/assist for medications management   Can travel by private vehicle     No  Equipment Recommendations  None recommended by PT    Recommendations for Other Services       Precautions / Restrictions Precautions Precautions: Fall Recall of Precautions/Restrictions: Impaired Precaution/Restrictions Comments: sacral wounds     Mobility  Bed Mobility Overal bed mobility: Needs Assistance Bed Mobility: Supine to Sit, Sit to Supine     Supine to sit: Mod assist, Used rails, HOB elevated, +2 for safety/equipment Sit to supine: Mod assist   General bed mobility comments: elevated HOB to initiate upright and pt pulling up on rail with both hands, assist for R leg off EOB and pt brought L leg, lifting help for trunk and  scooting hips with patient helping with cues; to supine assist for legs onto bed    Transfers Overall transfer level: Needs assistance Equipment used: Rolling walker (2 wheels) Transfers: Sit to/from Stand Sit to Stand: Max assist, +2 physical assistance           General transfer comment: patient pulling up on walker with A for blocking feet/legs as without anterior weight shift and feet sliding forward; leveraged up to stand though weight posterior to BOS and standing limited to 10 sec or less; completed 3 trials; noting R LE lifting off floor in standing and moving closer to L with difficulty maintaining static balance    Ambulation/Gait               General Gait Details: unable   Stairs             Wheelchair Mobility     Tilt Bed    Modified Rankin (Stroke Patients Only)       Balance Overall balance assessment: Needs assistance Sitting-balance support: Feet supported Sitting balance-Leahy Scale: Poor Sitting balance - Comments: able to sit briefly with S then with LOB posterior   Standing balance support: Bilateral upper extremity supported Standing balance-Leahy Scale: Zero Standing balance comment: +2 for standing trials                            Communication Communication Communication: No apparent difficulties  Cognition Arousal: Alert Behavior During Therapy: WFL for tasks assessed/performed   PT - Cognitive impairments: History of cognitive impairments  Following commands: Impaired Following commands impaired: Follows one step commands with increased time    Cueing    Exercises      General Comments General comments (skin integrity, edema, etc.): turned to get off sacral wound in supine      Pertinent Vitals/Pain Pain Assessment Pain Assessment: Faces Faces Pain Scale: Hurts little more Pain Location: generalized with scooting Pain Descriptors / Indicators: Grimacing,  Guarding Pain Intervention(s): Monitored during session, Limited activity within patient's tolerance    Home Living                          Prior Function            PT Goals (current goals can now be found in the care plan section) Progress towards PT goals: Progressing toward goals    Frequency    Min 1X/week      PT Plan      Co-evaluation              AM-PAC PT "6 Clicks" Mobility   Outcome Measure  Help needed turning from your back to your side while in a flat bed without using bedrails?: A Lot Help needed moving from lying on your back to sitting on the side of a flat bed without using bedrails?: A Lot Help needed moving to and from a bed to a chair (including a wheelchair)?: Total Help needed standing up from a chair using your arms (e.g., wheelchair or bedside chair)?: Total Help needed to walk in hospital room?: Total Help needed climbing 3-5 steps with a railing? : Total 6 Click Score: 8    End of Session Equipment Utilized During Treatment: Gait belt Activity Tolerance: Patient tolerated treatment well Patient left: in bed;with call bell/phone within reach;with bed alarm set   PT Visit Diagnosis: Muscle weakness (generalized) (M62.81);Difficulty in walking, not elsewhere classified (R26.2)     Time: 9563-8756 PT Time Calculation (min) (ACUTE ONLY): 23 min  Charges:    $Therapeutic Activity: 23-37 mins PT General Charges $$ ACUTE PT VISIT: 1 Visit                     Patricia Davies, PT Acute Rehabilitation Services Office:[phone] 05/22/2023    Patricia Davies 05/22/2023, 4:37 PM

## 2023-05-22 NOTE — Plan of Care (Signed)
  Problem: Activity: Goal: Risk for activity intolerance will decrease Outcome: Progressing   Problem: Coping: Goal: Level of anxiety will decrease Outcome: Progressing   Problem: Nutrition: Goal: Adequate nutrition will be maintained Outcome: Progressing   Problem: Elimination: Goal: Will not experience complications related to bowel motility Outcome: Progressing Goal: Will not experience complications related to urinary retention Outcome: Progressing

## 2023-05-22 NOTE — Progress Notes (Signed)
 PROGRESS NOTE  Patricia Davies  DOB: 08-07-1945  PCP: [name], MD WUJ:[id number]  DOA: 05/12/2023  LOS: 9 days  Hospital Day: 11  Brief narrative: Patricia Davies is a 78 y.o. female with PMH significant for severe dementia, chronic sacral decubitus ulcer, generalized weakness, adult failure to thrive, sarcoidosis, COPD, HTN, and RLE DVT Recently hospitalized at [facility] 2/20-3/12/2023 with sepsis due to pneumonia.  Soon after returning home, patient was brought back to ED with recurrent fever of 102 and significant lethargy.  In the ED, she was found to have a temperature of 103.6.   CT chest noted resolution of right lower lobe infiltrate but new area of left lower lobe infiltrate CT of the pelvis showed findings consistent with her known chronic sacral decubitus but worrisome for acute infection/fasciitis of the region.   Admitted to [facility] for sepsis secondary to aspiration pneumonia See below for details  Subjective: Patient was seen and examined this morning Propped up in bed.  Not in distress.  No new symptoms.  Family not at bedside  Assessment and plan: Sepsis POA Aspiration pneumonia CT chest this admission noted resolution of right lower lobe infiltrate but new area of left lower lobe infiltrate - perhaps she suffered an isolated aspiration event Respiratory status was stable Blood culture negative Completed a course of antibiotics with oral Augmentin on 3/19.  Chronic decubitus ulceration CT pelvis raised suspicion of acute infection. General surgery was consulted and rule out infection. Completed 10-day course of empiric antibiotics Wound care to continue  Constipation CT pelvis on admission showed 8 cm rectal stool ball.  Relieved after smoking enema. Currently on regular bowel regimen   Hypokalemia Potassium level improved with replacement. Recent Labs  Lab 05/16/23 0618 05/17/23 0259 05/22/23 0443  K 3.7  --  3.2*  MG 1.7 2.0  --     Chronic right lower extremity DVT Continue usual Eliquis therapy   Failure to thrive in adult protein calorie malnutrition   Sarcoidosis Severe fibrosis bilateral upper lung zones noted on CT chest Also has a presumed mycetoma in left apical lung cavity. Dates back to at least 08/08/2016 -appears slightly enlarged on imaging this admission.  No symptoms.   Dementia with behavioral disturbance Pleasant, cheerful.   Mobility: PT OT following  Goals of care   Code Status: Limited: Do not attempt resuscitation (DNR) -DNR-LIMITED -Do Not Intubate/DNI      DVT prophylaxis:   apixaban (ELIQUIS) tablet 5 mg   Antimicrobials: Completed a course of antibiotics today Fluid: None Consultants: Nephrology  Family Communication: None at bedside  Status: Inpatient Level of care:  Med-Surg   Patient is from: Home Anticipated d/c to: The patient is stabilized medically -her son feels quite overwhelmed trying to care for her at home.  SNF placement expected.  No essential change in the last 24 hours    Diet:  Diet Order             DIET FINGER FOODS Room service appropriate? Yes; Fluid consistency: Thin  Diet effective now                   Scheduled Meds:  apixaban  5 mg Oral BID   folic acid  1 mg Oral Daily   leptospermum manuka honey  1 Application Topical Daily   memantine  10 mg Oral BID   polyethylene glycol  17 g Oral BID   senna-docusate  2 tablet Oral BID  PRN meds: acetaminophen **OR** [DISCONTINUED] acetaminophen   Infusions:    Antimicrobials: Anti-infectives (From admission, onward)    Start     Dose/Rate Route Frequency Ordered Stop   05/15/23 1130  amoxicillin-clavulanate (AUGMENTIN) 875-125 MG per tablet 1 tablet        1 tablet Oral Every 12 hours 05/15/23 1038 05/21/23 2151   05/14/23 1000  vancomycin (VANCOCIN) IVPB 1000 mg/200 mL premix  Status:  Discontinued        1,000 mg 200 mL/hr over 60 Minutes Intravenous Every 36 hours 05/13/23  0411 05/14/23 1715   05/13/23 0800  ceFEPIme (MAXIPIME) 2 g in sodium chloride 0.9 % 100 mL IVPB  Status:  Discontinued        2 g 200 mL/hr over 30 Minutes Intravenous Every 12 hours 05/13/23 0411 05/15/23 1038   05/12/23 2000  ceFEPIme (MAXIPIME) 2 g in sodium chloride 0.9 % 100 mL IVPB        2 g 200 mL/hr over 30 Minutes Intravenous  Once 05/12/23 1958 05/12/23 2049   05/12/23 2000  metroNIDAZOLE (FLAGYL) IVPB 500 mg        500 mg 100 mL/hr over 60 Minutes Intravenous  Once 05/12/23 1958 05/12/23 2210   05/12/23 2000  vancomycin (VANCOCIN) IVPB 1000 mg/200 mL premix        1,000 mg 200 mL/hr over 60 Minutes Intravenous  Once 05/12/23 1958 05/12/23 2311       Objective: Vitals:   05/22/23 0519 05/22/23 0830  BP: (!) 144/69 124/69  Pulse: 85 75  Resp: 17 18  Temp: 98.1 F (36.7 C)   SpO2: 100% 100%    Intake/Output Summary (Last 24 hours) at 05/22/2023 1324 Last data filed at 05/22/2023 0649 Gross per 24 hour  Intake 0 ml  Output 0 ml  Net 0 ml   Filed Weights   05/13/23 1519  Weight: 39 kg   Weight change:  Body mass index is 16.79 kg/m.   Physical Exam: General exam: Pleasant, elderly thin built African-American female Skin: No rashes, lesions or ulcers. HEENT: Atraumatic, normocephalic, no obvious bleeding Lungs: Clear to auscultation bilaterally,  CVS: S1, S2, no murmur,   GI/Abd: Soft, nontender, nondistended, bowel sound present,   CNS: Alert, awake, oriented to place Psychiatry: Mood appropriate, cheerful demented Extremities: No pedal edema, no calf tenderness,   Data Review: I have personally reviewed the laboratory data and studies available.  F/u labs  Unresulted Labs (From admission, onward)    None       Total time spent in review of labs and imaging, patient evaluation, formulation of plan, documentation and communication with family: 25 minutes  Signed, Lorin Glass, MD [facility] 05/22/2023

## 2023-05-23 DIAGNOSIS — I10 Essential (primary) hypertension: Secondary | ICD-10-CM | POA: Diagnosis not present

## 2023-05-23 DIAGNOSIS — E43 Unspecified severe protein-calorie malnutrition: Secondary | ICD-10-CM | POA: Diagnosis not present

## 2023-05-23 DIAGNOSIS — L89320 Pressure ulcer of left buttock, unstageable: Secondary | ICD-10-CM | POA: Diagnosis not present

## 2023-05-23 DIAGNOSIS — L89310 Pressure ulcer of right buttock, unstageable: Secondary | ICD-10-CM | POA: Diagnosis not present

## 2023-05-23 DIAGNOSIS — L89150 Pressure ulcer of sacral region, unstageable: Secondary | ICD-10-CM | POA: Diagnosis not present

## 2023-05-23 DIAGNOSIS — R059 Cough, unspecified: Secondary | ICD-10-CM | POA: Diagnosis not present

## 2023-05-23 DIAGNOSIS — J8410 Pulmonary fibrosis, unspecified: Secondary | ICD-10-CM | POA: Diagnosis not present

## 2023-05-23 DIAGNOSIS — E46 Unspecified protein-calorie malnutrition: Secondary | ICD-10-CM | POA: Diagnosis not present

## 2023-05-23 DIAGNOSIS — R918 Other nonspecific abnormal finding of lung field: Secondary | ICD-10-CM | POA: Diagnosis not present

## 2023-05-23 DIAGNOSIS — K5900 Constipation, unspecified: Secondary | ICD-10-CM | POA: Diagnosis not present

## 2023-05-23 DIAGNOSIS — R0989 Other specified symptoms and signs involving the circulatory and respiratory systems: Secondary | ICD-10-CM | POA: Diagnosis not present

## 2023-05-23 DIAGNOSIS — F0390 Unspecified dementia without behavioral disturbance: Secondary | ICD-10-CM | POA: Diagnosis not present

## 2023-05-23 DIAGNOSIS — J690 Pneumonitis due to inhalation of food and vomit: Secondary | ICD-10-CM | POA: Diagnosis not present

## 2023-05-23 DIAGNOSIS — D649 Anemia, unspecified: Secondary | ICD-10-CM | POA: Diagnosis not present

## 2023-05-23 DIAGNOSIS — R627 Adult failure to thrive: Secondary | ICD-10-CM | POA: Diagnosis not present

## 2023-05-23 DIAGNOSIS — D860 Sarcoidosis of lung: Secondary | ICD-10-CM | POA: Diagnosis not present

## 2023-05-23 DIAGNOSIS — J9690 Respiratory failure, unspecified, unspecified whether with hypoxia or hypercapnia: Secondary | ICD-10-CM | POA: Diagnosis not present

## 2023-05-23 DIAGNOSIS — A419 Sepsis, unspecified organism: Secondary | ICD-10-CM | POA: Diagnosis not present

## 2023-05-23 DIAGNOSIS — L89154 Pressure ulcer of sacral region, stage 4: Secondary | ICD-10-CM | POA: Diagnosis not present

## 2023-05-23 DIAGNOSIS — I959 Hypotension, unspecified: Secondary | ICD-10-CM | POA: Diagnosis not present

## 2023-05-23 DIAGNOSIS — D510 Vitamin B12 deficiency anemia due to intrinsic factor deficiency: Secondary | ICD-10-CM | POA: Diagnosis not present

## 2023-05-23 DIAGNOSIS — F03918 Unspecified dementia, unspecified severity, with other behavioral disturbance: Secondary | ICD-10-CM | POA: Diagnosis not present

## 2023-05-23 DIAGNOSIS — R269 Unspecified abnormalities of gait and mobility: Secondary | ICD-10-CM | POA: Diagnosis not present

## 2023-05-23 DIAGNOSIS — S81801A Unspecified open wound, right lower leg, initial encounter: Secondary | ICD-10-CM | POA: Diagnosis not present

## 2023-05-23 DIAGNOSIS — Z8701 Personal history of pneumonia (recurrent): Secondary | ICD-10-CM | POA: Diagnosis not present

## 2023-05-23 DIAGNOSIS — Z7401 Bed confinement status: Secondary | ICD-10-CM | POA: Diagnosis not present

## 2023-05-23 DIAGNOSIS — J449 Chronic obstructive pulmonary disease, unspecified: Secondary | ICD-10-CM | POA: Diagnosis not present

## 2023-05-23 DIAGNOSIS — I82501 Chronic embolism and thrombosis of unspecified deep veins of right lower extremity: Secondary | ICD-10-CM | POA: Diagnosis not present

## 2023-05-23 DIAGNOSIS — D869 Sarcoidosis, unspecified: Secondary | ICD-10-CM | POA: Diagnosis not present

## 2023-05-23 MED ORDER — SENNOSIDES-DOCUSATE SODIUM 8.6-50 MG PO TABS: 2.0000 | ORAL_TABLET | Freq: Two times a day (BID) | ORAL | Status: AC

## 2023-05-23 MED ORDER — POLYETHYLENE GLYCOL 3350 17 G PO PACK: 17.0000 g | PACK | Freq: Two times a day (BID) | ORAL | Status: AC

## 2023-05-23 NOTE — Discharge Summary (Signed)
 Physician Discharge Summary  Patricia Davies:[id number] DOB: Jan 14, 1946 DOA: 05/12/2023  PCP: [name], MD  Admit date: 05/12/2023 Discharge date: 05/23/2023  Admitted From: Home Discharge disposition: SNF   Brief narrative: Patricia Davies is a 78 y.o. female with PMH significant for severe dementia, chronic sacral decubitus ulcer, generalized weakness, adult failure to thrive, sarcoidosis, COPD, HTN, and RLE DVT Recently hospitalized at [facility] 2/20-3/12/2023 with sepsis due to pneumonia.  Soon after returning home, patient was brought back to ED with recurrent fever of 102 and significant lethargy.  In the ED, she was found to have a temperature of 103.6.   CT chest noted resolution of right lower lobe infiltrate but new area of left lower lobe infiltrate CT of the pelvis showed findings consistent with her known chronic sacral decubitus but worrisome for acute infection/fasciitis of the region.   Admitted to [facility] for sepsis secondary to aspiration pneumonia See below for details  Subjective: Patient was seen and examined this morning pleasant elderly African-American female.  Sitting up in recliner.  Not in distress.  On low-flow oxygen. Ready for discharge to SNF today.  Assessment and plan: Sepsis POA Aspiration pneumonia CT chest this admission noted resolution of right lower lobe infiltrate but new area of left lower lobe infiltrate - perhaps she suffered an isolated aspiration event Respiratory status was stable Blood culture negative Completed a course of antibiotics with oral Augmentin on 3/19.  Chronic decubitus ulceration CT pelvis raised suspicion of acute infection. General surgery was consulted and rule out infection. Completed 10-day course of empiric antibiotics Wound care to continue  Constipation CT pelvis on admission showed 8 cm rectal stool ball.  Relieved after smog enema. Currently on regular bowel regimen   Chronic right lower  extremity DVT Continue usual Eliquis therapy   Failure to thrive in adult protein calorie malnutrition   Sarcoidosis Severe fibrosis bilateral upper lung zones noted on CT chest Also has a presumed mycetoma in left apical lung cavity. Dates back to at least 08/08/2016 -appears slightly enlarged on imaging this admission.  No symptoms.   Dementia with behavioral disturbance Pleasant, cheerful.   Mobility: PT OT following  Goals of care   Code Status: Limited: Do not attempt resuscitation (DNR) -DNR-LIMITED -Do Not Intubate/DNI     Consultants: Nephrology  Family Communication: None at bedside  Diet:  Diet Order             Diet general           DIET FINGER FOODS Room service appropriate? Yes; Fluid consistency: Thin  Diet effective now                   Nutritional status:  Body mass index is 16.79 kg/m.       Wounds:  - Pressure Injury 04/24/23 Hip Left Deep Tissue Pressure Injury - Purple or maroon localized area of discolored intact skin or blood-filled blister due to damage of underlying soft tissue from pressure and/or shear. (Active)  Date First Assessed/Time First Assessed: 04/24/23 1806   Location: Hip  Location Orientation: Left  Staging: Deep Tissue Pressure Injury - Purple or maroon localized area of discolored intact skin or blood-filled blister due to damage of underlying so...    Assessments 04/24/2023  5:15 PM 05/23/2023  6:00 AM  Wound Image     Dressing Type Other (Comment);Gauze (Comment) Foam - Lift dressing to assess site every shift;Moist to dry;Honey  Dressing Changed;Clean, Dry, Intact  Changed  Dressing Change Frequency Other (Comment) Twice a day  Site / Wound Assessment Black;Red --  Wound Length (cm) 6 cm --  Wound Width (cm) 5 cm --  Wound Depth (cm) 0 cm --  Wound Surface Area (cm^2) 30 cm^2 --  Wound Volume (cm^3) 0 cm^3 --  Margins Attached edges (approximated) --  Drainage Amount None --  Treatment -- Cleansed     No  associated orders.     Pressure Injury 04/24/23 Hip Right Stage 3 -  Full thickness tissue loss. Subcutaneous fat may be visible but bone, tendon or muscle are NOT exposed. (Active)  Date First Assessed/Time First Assessed: 04/24/23 1813   Location: Hip  Location Orientation: Right  Staging: Stage 3 -  Full thickness tissue loss. Subcutaneous fat may be visible but bone, tendon or muscle are NOT exposed.  Present on Admission: Yes    Assessments 04/24/2023  5:15 PM 05/23/2023  6:00 AM  Wound Image     Dressing Type Other (Comment);Gauze (Comment) Foam - Lift dressing to assess site every shift;Moist to dry;Honey  Dressing Clean, Dry, Intact;Changed Changed  Dressing Change Frequency Other (Comment) Twice a day  State of Healing Other (Comment) --  Site / Wound Assessment Black;Red --  % Wound base Red or Granulating 50% --  % Wound base Yellow/Fibrinous Exudate 30% --  % Wound base Other/Granulation Tissue (Comment) 20% --  Wound Length (cm) 3.8 cm --  Wound Width (cm) 1.9 cm --  Wound Depth (cm) 0 cm --  Wound Surface Area (cm^2) 7.22 cm^2 --  Wound Volume (cm^3) 0 cm^3 --  Margins Unattached edges (unapproximated) --  Drainage Amount None --  Treatment Cleansed Cleansed     No associated orders.     Pressure Injury 04/24/23 Sacrum Unstageable - Full thickness tissue loss in which the base of the injury is covered by slough (yellow, tan, gray, green or brown) and/or eschar (tan, brown or black) in the wound bed. (Active)  Date First Assessed/Time First Assessed: 04/24/23 1821   Location: Sacrum  Staging: Unstageable - Full thickness tissue loss in which the base of the injury is covered by slough (yellow, tan, gray, green or brown) and/or eschar (tan, brown or black) i...    Assessments 04/24/2023  5:15 PM 05/23/2023  6:00 AM  Wound Image     Dressing Type Other (Comment);Gauze (Comment) Foam - Lift dressing to assess site every shift;Moist to dry;Honey  Dressing Changed;Clean, Dry,  Intact Changed  Dressing Change Frequency Other (Comment) Twice a day  State of Healing Eschar --  Site / Wound Assessment Black;Red --  Wound Length (cm) 5 cm --  Wound Width (cm) 6 cm --  Wound Depth (cm) 0 cm --  Wound Surface Area (cm^2) 30 cm^2 --  Wound Volume (cm^3) 0 cm^3 --  Margins Attached edges (approximated) --  Drainage Amount Scant --  Drainage Description Serosanguineous --     No associated orders.     Wound / Incision (Open or Dehisced) 05/13/23 Other (Comment) Vertebral column Upper (Active)  Date First Assessed/Time First Assessed: 05/13/23 1600   Wound Type: (c) Other (Comment)  Location: Vertebral column  Location Orientation: Upper  Present on Admission: Yes    Assessments 05/13/2023  4:00 PM 05/22/2023  8:44 PM  Dressing Type Foam - Lift dressing to assess site every shift Foam - Lift dressing to assess site every shift  Dressing Changed New --  Dressing Status Clean, Dry, Intact Clean, Dry, Intact  Site / Wound Assessment Pink --  Wound Length (cm) 0.5 cm --  Wound Width (cm) 1 cm --  Wound Surface Area (cm^2) 0.5 cm^2 --  Drainage Amount None --     No associated orders.     Pressure Injury 05/20/23 Heel Right Deep Tissue Pressure Injury - Purple or maroon localized area of discolored intact skin or blood-filled blister due to damage of underlying soft tissue from pressure and/or shear. (Active)  Date First Assessed/Time First Assessed: 05/20/23 1945   Location: Heel  Location Orientation: Right  Staging: Deep Tissue Pressure Injury - Purple or maroon localized area of discolored intact skin or blood-filled blister due to damage of underlying ...    Assessments 05/20/2023  7:45 PM 05/23/2023  8:00 AM  Wound Image     Dressing Type Foam - Lift dressing to assess site every shift Foam - Lift dressing to assess site every shift  Dressing Changed Clean, Dry, Intact  Dressing Change Frequency Every 3 days Every 3 days  Site / Wound Assessment Black --  Wound  Length (cm) 3 cm --  Wound Width (cm) 2 cm --  Wound Surface Area (cm^2) 6 cm^2 --  Drainage Amount None None  Treatment -- Off loading     No associated orders.     Pressure Injury 05/20/23 Toe (Comment  which one) Right Deep Tissue Pressure Injury - Purple or maroon localized area of discolored intact skin or blood-filled blister due to damage of underlying soft tissue from pressure and/or shear. (Active)  Date First Assessed/Time First Assessed: 05/20/23 1945   Location: (c) Toe (Comment  which one)  Location Orientation: Right  Staging: Deep Tissue Pressure Injury - Purple or maroon localized area of discolored intact skin or blood-filled blister due ...    Assessments 05/20/2023  7:45 PM 05/22/2023  8:44 PM  Wound Image     Dressing Type Foam - Lift dressing to assess site every shift Foam - Lift dressing to assess site every shift  Dressing Changed Clean, Dry, Intact  Dressing Change Frequency Every 3 days Every 3 days  Wound Length (cm) 1.4 cm --  Wound Width (cm) 2 cm --  Wound Surface Area (cm^2) 2.8 cm^2 --  Drainage Amount None --  Treatment -- Off loading     No associated orders.     Pressure Injury 05/20/23 Heel Right Deep Tissue Pressure Injury - Purple or maroon localized area of discolored intact skin or blood-filled blister due to damage of underlying soft tissue from pressure and/or shear. (Active)  Date First Assessed/Time First Assessed: 05/20/23 1945   Location: Heel  Location Orientation: Right  Staging: Deep Tissue Pressure Injury - Purple or maroon localized area of discolored intact skin or blood-filled blister due to damage of underlying ...    Assessments 05/20/2023  7:45 PM 05/22/2023  8:44 PM  Wound Image     Dressing Type Foam - Lift dressing to assess site every shift Foam - Lift dressing to assess site every shift  Dressing Changed Clean, Dry, Intact  Dressing Change Frequency Every 3 days Every 3 days  Wound Length (cm) 2 cm --  Wound Width (cm) 1 cm --   Wound Surface Area (cm^2) 2 cm^2 --  Drainage Amount None None  Treatment -- Off loading     No associated orders.    Discharge Exam:   Vitals:   05/22/23 2000 05/23/23 0512 05/23/23 0518 05/23/23 0856  BP: 117/81 122/83  117/63  Pulse: 91 (!)  105 90 98  Resp: 18 18  18   Temp: 98.2 F (36.8 C) (!) 97.4 F (36.3 C)  97.7 F (36.5 C)  TempSrc:      SpO2: 94% (!) 84% 95% 100%  Weight:      Height:        Body mass index is 16.79 kg/m.  General exam: Pleasant, elderly thin built African-American female Skin: No rashes, lesions or ulcers. HEENT: Atraumatic, normocephalic, no obvious bleeding Lungs: Clear to auscultation bilaterally,  CVS: S1, S2, no murmur,   GI/Abd: Soft, nontender, nondistended, bowel sound present,   CNS: Alert, awake, oriented to place Psychiatry: Mood appropriate, cheerful demented Extremities: No pedal edema, no calf tenderness,   Follow ups:    Contact information for follow-up providers     [name], MD Follow up.   Specialty: Internal Medicine Contact information: [address] [place] [address] [phone]              Contact information for after-discharge care     Destination     [facility] .   Service: Skilled Nursing Contact information: [address] [place] [address] [phone]                     Discharge Instructions:   Discharge Instructions     Call MD for:  difficulty breathing, headache or visual disturbances   Complete by: As directed    Call MD for:  extreme fatigue   Complete by: As directed    Call MD for:  hives   Complete by: As directed    Call MD for:  persistant dizziness or light-headedness   Complete by: As directed    Call MD for:  persistant nausea and vomiting   Complete by: As directed    Call MD for:  severe uncontrolled pain   Complete by: As directed    Call MD for:  temperature >100.4   Complete by: As directed    Diet  general   Complete by: As directed    Discharge instructions   Complete by: As directed    General discharge instructions: Follow with Primary MD [name], MD in 7 days  Please request your PCP  to go over your hospital tests, procedures, radiology results at the follow up. Please get your medicines reviewed and adjusted.  Your PCP may decide to repeat certain labs or tests as needed. Do not drive, operate heavy machinery, perform activities at heights, swimming or participation in water activities or provide baby sitting services if your were admitted for syncope or siezures until you have seen by Primary MD or a Neurologist and advised to do so again. [place] Controlled Substance Reporting System database was reviewed. Do not drive, operate heavy machinery, perform activities at heights, swim, participate in water activities or provide baby-sitting services while on medications for pain, sleep and mood until your outpatient physician has reevaluated you and advised to do so again.  You are strongly recommended to comply with the dose, frequency and duration of prescribed medications. Activity: As tolerated with Full fall precautions use walker/cane & assistance as needed Avoid using any recreational substances like cigarette, tobacco, alcohol, or non-prescribed drug. If you experience worsening of your admission symptoms, develop shortness of breath, life threatening emergency, suicidal or homicidal thoughts you must seek medical attention immediately by calling 911 or calling your MD immediately  if symptoms less severe. You must read complete instructions/literature along  with all the possible adverse reactions/side effects for all the medicines you take and that have been prescribed to you. Take any new medicine only after you have completely understood and accepted all the possible adverse reactions/side effects.  Wear Seat belts while driving. You were cared for by a hospitalist  during your hospital stay. If you have any questions about your discharge medications or the care you received while you were in the hospital after you are discharged, you can call the unit and ask to speak with the hospitalist or the covering physician. Once you are discharged, your primary care physician will handle any further medical issues. Please note that NO REFILLS for any discharge medications will be authorized once you are discharged, as it is imperative that you return to your primary care physician (or establish a relationship with a primary care physician if you do not have one).   Discharge wound care:   Complete by: As directed    Increase activity slowly   Complete by: As directed        Discharge Medications:   Allergies as of 05/23/2023   No Known Allergies      Medication List     STOP taking these medications    Eliquis DVT/PE Starter Pack Generic drug: Apixaban Starter Pack (10mg  and 5mg )   polyethylene glycol powder 17 GM/SCOOP powder Commonly known as: GLYCOLAX/MIRALAX Replaced by: polyethylene glycol 17 g packet   traMADol 50 MG tablet Commonly known as: Ultram       TAKE these medications    acetaminophen 325 MG tablet Commonly known as: TYLENOL Take 650 mg by mouth every 6 (six) hours as needed.   feeding supplement Liqd Take 237 mLs by mouth 2 (two) times daily between meals.   folic acid 1 MG tablet Commonly known as: FOLVITE Take 1 tablet (1 mg total) by mouth daily.   leptospermum manuka honey Pste paste Apply 1 Application topically at bedtime.   memantine 10 MG tablet Commonly known as: NAMENDA Take 1 tablet (10 mg at night) for 2 weeks, then increase to 1 tablet (10 mg) twice a day What changed:  how much to take how to take this when to take this   polyethylene glycol 17 g packet Commonly known as: MIRALAX / GLYCOLAX Take 17 g by mouth 2 (two) times daily. Replaces: polyethylene glycol powder 17 GM/SCOOP powder    senna-docusate 8.6-50 MG tablet Commonly known as: Senokot-S Take 2 tablets by mouth 2 (two) times daily.               Discharge Care Instructions  (From admission, onward)           Start     Ordered   05/23/23 0000  Discharge wound care:        05/23/23 1055             The results of significant diagnostics from this hospitalization (including imaging, microbiology, ancillary and laboratory) are listed below for reference.    Procedures and Diagnostic Studies:   CT CHEST W CONTRAST Result Date: 05/13/2023 CLINICAL DATA:  Dyspnea. Hypoxia. History of sarcoidosis. Dementia. Mycetoma EXAM: CT CHEST WITH CONTRAST TECHNIQUE: Multidetector CT imaging of the chest was performed during intravenous contrast administration. RADIATION DOSE REDUCTION: This exam was performed according to the departmental dose-optimization program which includes automated exposure control, adjustment of the mA and/or kV according to patient size and/or use of iterative reconstruction technique. CONTRAST:  50mL OMNIPAQUE IOHEXOL 350 MG/ML SOLN  COMPARISON:  08/08/2016 FINDINGS: Cardiovascular: Aortic atherosclerosis. Tortuous thoracic aorta. Mild cardiomegaly. Lad coronary artery calcification. Pulmonary artery enlargement, outflow tract 3.4 cm. No central pulmonary embolism, on this non-dedicated study. Mediastinum/Nodes: No mediastinal or hilar adenopathy. Moderate upper esophageal dilatation. Lungs/Pleura: Trace bilateral pleural fluid. Severe upper lobe predominant pulmonary fibrosis with architectural distortion and volume loss. Within the right apex and superior segment right lower lobe, this is relatively similar, with areas of cystic bronchiectasis and cavitation. The right lower lobe pneumonia on 08/08/2016 CT is resolved with presumably postinfectious scarring/interstitial thickening remaining. Left upper lung aeration is significantly worsened. New anterior left apical consolidation, superimposed  upon superior segment left lower lobe and less so left upper lobe traction bronchiectasis, volume loss and architectural distortion. The presumed mycetoma within the left apical cavity has progressed. Example 3.1 x 2.7 cm on 34/4 versus 2.0 cm back in 2018. Left lower lobe areas of mild ground-glass nodularity are new, including on 96/4. Upper Abdomen: Motion degradation. Contrast reflux into the hepatic veins suggests elevated right heart pressures. Normal imaged portions of the spleen, stomach, pancreas, adrenal glands, right kidney. 4 mm left renal collecting system calculus. Musculoskeletal: No acute osseous abnormality. IMPRESSION: 1. Redemonstration of upper lung predominant severe fibrosis, likely related to the clinical history of sarcoidosis. This is relatively similar on the right and significantly progressive on the left. The presumed mycetoma within a left apical cavity is enlarged since 08/08/2016. 2. Right lower lobe infection has resolved since the prior CT. New left lower lobe areas of ground-glass nodularity are suspicious for subacute infection or aspiration. 3. Pulmonary artery enlargement suggests pulmonary arterial hypertension. 4. Coronary artery atherosclerosis. Aortic Atherosclerosis (ICD10-I70.0). 5. Left nephrolithiasis 6. Trace bilateral pleural fluid. Electronically Signed   By: Jeronimo Greaves M.D.   On: 05/13/2023 13:23   CT PELVIS W CONTRAST Result Date: 05/12/2023 CLINICAL DATA:  Soft tissue infection suspected, pelvis, xray done EXAM: CT PELVIS WITH CONTRAST TECHNIQUE: Multidetector CT imaging of the pelvis was performed using the standard protocol following the bolus administration of intravenous contrast. RADIATION DOSE REDUCTION: This exam was performed according to the departmental dose-optimization program which includes automated exposure control, adjustment of the mA and/or kV according to patient size and/or use of iterative reconstruction technique. CONTRAST:  75mL OMNIPAQUE  IOHEXOL 350 MG/ML SOLN COMPARISON:  None Available. FINDINGS: Urinary Tract:  No abnormality visualized. Bowel:  Stool throughout the majority of the visualized colon. Vascular/Lymphatic: Atherosclerotic plaque. No pathologically enlarged lymph nodes. No significant vascular abnormality seen. Reproductive:  No mass or other significant abnormality Other: No intraperitoneal free fluid. No intraperitoneal free gas. No organized fluid collection. Musculoskeletal: Midline lower back and gluteal subcutaneus soft tissue edema and emphysema. Gas extends to the soft tissues along the sacrum. Right femoral head avascular necrosis. No acute displaced fracture. No acute displaced fracture or dislocation of the bilateral hips. No acute displaced fracture or diastasis of the bones of the pelvis. No cortical erosion or destruction. Grade 2 anterolisthesis of L4 on L5. Degenerative changes of visualized lower lumbar spine. IMPRESSION: 1. Midline lower back and gluteal subcutaneus soft tissue edema and emphysema. Gas extends to the soft tissues along the sacrum. A necrotizing fasciitis cannot be excluded as this is a clinical diagnosis. 2. No CT evidence of osteomyelitis. 3. Constipation.  8 cm rectal stool ball. 4. Grade 2 anterolisthesis of L4 on L5. 5.  Aortic Atherosclerosis (ICD10-I70.0). Electronically Signed   By: Tish Frederickson M.D.   On: 05/12/2023 23:38   DG Chest  Port 1 View Result Date: 05/12/2023 CLINICAL DATA:  Possible sepsis EXAM: PORTABLE CHEST 1 VIEW COMPARISON:  05/02/2023, 07/22/2019 FINDINGS: Chronic pleural and parenchymal scarring at the left greater than right apex with elevation the hila and areas of architectural distortion. No acute airspace disease or effusion. Stable cardiomediastinal silhouette with aortic atherosclerosis. Left apical progressive airspace disease compared to more remote exams. IMPRESSION: 1. No active disease. 2. Largely chronic lung disease with biapical pleuroparenchymal scarring  and areas of distortion and fibrosis. Left apical density slightly progressive compared to more remote exams and could be secondary to progressive scarring, superimposed infection or developing mass and chest CT follow-up was previously recommended. Electronically Signed   By: Jasmine Pang M.D.   On: 05/12/2023 23:05     Labs:   Basic Metabolic Panel: Recent Labs  Lab 05/17/23 0259 05/22/23 0443  NA  --  139  K  --  3.2*  CL  --  104  CO2  --  31  GLUCOSE  --  79  BUN  --  10  CREATININE  --  0.38*  CALCIUM  --  8.1*  MG 2.0  --    GFR Estimated Creatinine Clearance: 37.4 mL/min (A) (by C-G formula based on SCr of 0.38 mg/dL (L)). Liver Function Tests: No results for input(s): "AST", "ALT", "ALKPHOS", "BILITOT", "PROT", "ALBUMIN" in the last 168 hours. No results for input(s): "LIPASE", "AMYLASE" in the last 168 hours. No results for input(s): "AMMONIA" in the last 168 hours. Coagulation profile No results for input(s): "INR", "PROTIME" in the last 168 hours.  CBC: Recent Labs  Lab 05/17/23 0259 05/22/23 0443  WBC 5.9 5.8  NEUTROABS  --  2.4  HGB 8.0* 8.0*  HCT 25.0* 25.1*  MCV 100.0 98.8  PLT 245 426*   Cardiac Enzymes: No results for input(s): "CKTOTAL", "CKMB", "CKMBINDEX", "TROPONINI" in the last 168 hours. BNP: Invalid input(s): "POCBNP" CBG: No results for input(s): "GLUCAP" in the last 168 hours. D-Dimer No results for input(s): "DDIMER" in the last 72 hours. Hgb A1c No results for input(s): "HGBA1C" in the last 72 hours. Lipid Profile No results for input(s): "CHOL", "HDL", "LDLCALC", "TRIG", "CHOLHDL", "LDLDIRECT" in the last 72 hours. Thyroid function studies No results for input(s): "TSH", "T4TOTAL", "T3FREE", "THYROIDAB" in the last 72 hours.  Invalid input(s): "FREET3" Anemia work up No results for input(s): "VITAMINB12", "FOLATE", "FERRITIN", "TIBC", "IRON", "RETICCTPCT" in the last 72 hours. Microbiology No results found for this or any  previous visit (from the past 240 hours).  Time coordinating discharge: 45 minutes  Signed: Ruvim Risko  [facility] 05/23/2023, 10:55 AM

## 2023-05-23 NOTE — TOC Transition Note (Signed)
 Transition of Care [facility]) - Discharge Note   Patient Details  Name: Patricia Davies MRN: [id number] Date of Birth: 02-14-1946  Transition of [facility]) CM/SW Contact:  Deatra Robinson, [place] Phone Number: 05/23/2023, 12:44 PM   Clinical Narrative: Pt for dc to [facility] today. Spoke to [facility] in admissions who confirmed they are prepared to admit pt to room 208. Pt's son Chrissie Noa aware of dc and reports agreeable. RN provided with number for report and [facility] arranged for transport. SW signing off at dc.   Dellie Burns, MSW, LCSW [phone] (coverage)        Final next level of care: Skilled Nursing Facility Barriers to Discharge: Barriers Resolved   Patient Goals and CMS Choice Patient states their goals for this hospitalization and ongoing recovery are:: son expressed interest in patient discharging to SNF   Choice offered to / list presented to : Adult Children [place] ownership interest in [facility].provided to:: Adult Children    Discharge Placement              Patient chooses bed at: [facility] Patient to be transferred to facility by: [facility] Name of family member notified: William/son Patient and family notified of of transfer: 05/23/23  Discharge Plan and Services Additional resources added to the After Visit Summary for   In-house Referral: Clinical Social Work                                   Social Drivers of Health (SDOH) Interventions SDOH Screenings   Food Insecurity: No Food Insecurity (05/13/2023)  Housing: Low Risk  (05/13/2023)  Transportation Needs: No Transportation Needs (05/13/2023)  Utilities: Not At Risk (05/13/2023)  Alcohol Screen: Low Risk  (12/06/2021)  Depression (PHQ2-9): Low Risk  (12/06/2021)  Financial Resource Strain: Low Risk  (12/06/2021)  Physical Activity: Inactive (12/06/2021)  Social Connections: Socially Isolated (05/13/2023)  Stress: No Stress Concern Present (12/06/2021)  Tobacco Use: Medium Risk  (05/12/2023)     Readmission Risk Interventions     No data to display

## 2023-05-28 DIAGNOSIS — L89154 Pressure ulcer of sacral region, stage 4: Secondary | ICD-10-CM | POA: Diagnosis not present

## 2023-05-28 DIAGNOSIS — L89310 Pressure ulcer of right buttock, unstageable: Secondary | ICD-10-CM | POA: Diagnosis not present

## 2023-05-28 DIAGNOSIS — L89320 Pressure ulcer of left buttock, unstageable: Secondary | ICD-10-CM | POA: Diagnosis not present

## 2023-05-28 DIAGNOSIS — R627 Adult failure to thrive: Secondary | ICD-10-CM | POA: Diagnosis not present

## 2023-06-09 DIAGNOSIS — R059 Cough, unspecified: Secondary | ICD-10-CM | POA: Diagnosis not present

## 2023-06-09 DIAGNOSIS — R0989 Other specified symptoms and signs involving the circulatory and respiratory systems: Secondary | ICD-10-CM | POA: Diagnosis not present

## 2023-06-10 DIAGNOSIS — L89154 Pressure ulcer of sacral region, stage 4: Secondary | ICD-10-CM | POA: Diagnosis not present

## 2023-06-10 DIAGNOSIS — L89320 Pressure ulcer of left buttock, unstageable: Secondary | ICD-10-CM | POA: Diagnosis not present

## 2023-06-10 DIAGNOSIS — S81801A Unspecified open wound, right lower leg, initial encounter: Secondary | ICD-10-CM | POA: Diagnosis not present

## 2023-06-11 DIAGNOSIS — D860 Sarcoidosis of lung: Secondary | ICD-10-CM | POA: Diagnosis not present

## 2023-06-11 DIAGNOSIS — Z8701 Personal history of pneumonia (recurrent): Secondary | ICD-10-CM | POA: Diagnosis not present

## 2023-06-11 DIAGNOSIS — R918 Other nonspecific abnormal finding of lung field: Secondary | ICD-10-CM | POA: Diagnosis not present

## 2023-06-11 DIAGNOSIS — F0390 Unspecified dementia without behavioral disturbance: Secondary | ICD-10-CM | POA: Diagnosis not present

## 2023-07-01 DIAGNOSIS — R627 Adult failure to thrive: Secondary | ICD-10-CM | POA: Diagnosis not present

## 2023-07-01 DIAGNOSIS — L89310 Pressure ulcer of right buttock, unstageable: Secondary | ICD-10-CM | POA: Diagnosis not present

## 2023-07-01 DIAGNOSIS — L89320 Pressure ulcer of left buttock, unstageable: Secondary | ICD-10-CM | POA: Diagnosis not present

## 2023-07-01 DIAGNOSIS — L89154 Pressure ulcer of sacral region, stage 4: Secondary | ICD-10-CM | POA: Diagnosis not present

## 2023-07-15 DIAGNOSIS — L89320 Pressure ulcer of left buttock, unstageable: Secondary | ICD-10-CM | POA: Diagnosis not present

## 2023-07-15 DIAGNOSIS — L89310 Pressure ulcer of right buttock, unstageable: Secondary | ICD-10-CM | POA: Diagnosis not present

## 2023-07-15 DIAGNOSIS — L89154 Pressure ulcer of sacral region, stage 4: Secondary | ICD-10-CM | POA: Diagnosis not present

## 2023-07-18 DIAGNOSIS — J8410 Pulmonary fibrosis, unspecified: Secondary | ICD-10-CM | POA: Diagnosis not present

## 2023-07-18 DIAGNOSIS — L89310 Pressure ulcer of right buttock, unstageable: Secondary | ICD-10-CM | POA: Diagnosis not present

## 2023-07-18 DIAGNOSIS — R269 Unspecified abnormalities of gait and mobility: Secondary | ICD-10-CM | POA: Diagnosis not present

## 2023-07-18 DIAGNOSIS — E46 Unspecified protein-calorie malnutrition: Secondary | ICD-10-CM | POA: Diagnosis not present

## 2023-07-18 DIAGNOSIS — J9690 Respiratory failure, unspecified, unspecified whether with hypoxia or hypercapnia: Secondary | ICD-10-CM | POA: Diagnosis not present

## 2023-07-18 DIAGNOSIS — L89154 Pressure ulcer of sacral region, stage 4: Secondary | ICD-10-CM | POA: Diagnosis not present

## 2023-07-18 DIAGNOSIS — L89320 Pressure ulcer of left buttock, unstageable: Secondary | ICD-10-CM | POA: Diagnosis not present

## 2023-07-18 DIAGNOSIS — D649 Anemia, unspecified: Secondary | ICD-10-CM | POA: Diagnosis not present

## 2023-07-18 DIAGNOSIS — F0390 Unspecified dementia without behavioral disturbance: Secondary | ICD-10-CM | POA: Diagnosis not present

## 2023-07-29 DIAGNOSIS — F0390 Unspecified dementia without behavioral disturbance: Secondary | ICD-10-CM | POA: Diagnosis not present

## 2023-07-29 DIAGNOSIS — L89154 Pressure ulcer of sacral region, stage 4: Secondary | ICD-10-CM | POA: Diagnosis not present

## 2023-07-29 DIAGNOSIS — L89320 Pressure ulcer of left buttock, unstageable: Secondary | ICD-10-CM | POA: Diagnosis not present

## 2023-07-29 DIAGNOSIS — L89310 Pressure ulcer of right buttock, unstageable: Secondary | ICD-10-CM | POA: Diagnosis not present

## 2023-07-29 DIAGNOSIS — E46 Unspecified protein-calorie malnutrition: Secondary | ICD-10-CM | POA: Diagnosis not present

## 2023-07-31 DIAGNOSIS — F03C0 Unspecified dementia, severe, without behavioral disturbance, psychotic disturbance, mood disturbance, and anxiety: Secondary | ICD-10-CM | POA: Diagnosis not present

## 2023-08-05 DIAGNOSIS — L89320 Pressure ulcer of left buttock, unstageable: Secondary | ICD-10-CM | POA: Diagnosis not present

## 2023-08-05 DIAGNOSIS — L89154 Pressure ulcer of sacral region, stage 4: Secondary | ICD-10-CM | POA: Diagnosis not present

## 2023-08-05 DIAGNOSIS — L89310 Pressure ulcer of right buttock, unstageable: Secondary | ICD-10-CM | POA: Diagnosis not present

## 2023-08-12 DIAGNOSIS — L89320 Pressure ulcer of left buttock, unstageable: Secondary | ICD-10-CM | POA: Diagnosis not present

## 2023-08-12 DIAGNOSIS — Z872 Personal history of diseases of the skin and subcutaneous tissue: Secondary | ICD-10-CM | POA: Diagnosis not present

## 2023-08-12 DIAGNOSIS — L89310 Pressure ulcer of right buttock, unstageable: Secondary | ICD-10-CM | POA: Diagnosis not present

## 2023-08-13 DIAGNOSIS — E08311 Diabetes mellitus due to underlying condition with unspecified diabetic retinopathy with macular edema: Secondary | ICD-10-CM | POA: Diagnosis not present

## 2023-08-13 DIAGNOSIS — E039 Hypothyroidism, unspecified: Secondary | ICD-10-CM | POA: Diagnosis not present

## 2023-08-13 DIAGNOSIS — E785 Hyperlipidemia, unspecified: Secondary | ICD-10-CM | POA: Diagnosis not present

## 2023-08-13 DIAGNOSIS — D509 Iron deficiency anemia, unspecified: Secondary | ICD-10-CM | POA: Diagnosis not present

## 2023-08-13 DIAGNOSIS — E559 Vitamin D deficiency, unspecified: Secondary | ICD-10-CM | POA: Diagnosis not present

## 2023-08-13 DIAGNOSIS — I5020 Unspecified systolic (congestive) heart failure: Secondary | ICD-10-CM | POA: Diagnosis not present

## 2023-08-13 DIAGNOSIS — D518 Other vitamin B12 deficiency anemias: Secondary | ICD-10-CM | POA: Diagnosis not present

## 2023-08-19 DIAGNOSIS — Z872 Personal history of diseases of the skin and subcutaneous tissue: Secondary | ICD-10-CM | POA: Diagnosis not present

## 2023-08-19 DIAGNOSIS — F03918 Unspecified dementia, unspecified severity, with other behavioral disturbance: Secondary | ICD-10-CM | POA: Diagnosis not present

## 2023-08-19 DIAGNOSIS — L89154 Pressure ulcer of sacral region, stage 4: Secondary | ICD-10-CM | POA: Diagnosis not present

## 2023-08-19 DIAGNOSIS — L89310 Pressure ulcer of right buttock, unstageable: Secondary | ICD-10-CM | POA: Diagnosis not present

## 2023-08-19 DIAGNOSIS — M6281 Muscle weakness (generalized): Secondary | ICD-10-CM | POA: Diagnosis not present

## 2023-08-20 DIAGNOSIS — F03918 Unspecified dementia, unspecified severity, with other behavioral disturbance: Secondary | ICD-10-CM | POA: Diagnosis not present

## 2023-08-20 DIAGNOSIS — M6281 Muscle weakness (generalized): Secondary | ICD-10-CM | POA: Diagnosis not present

## 2023-08-21 DIAGNOSIS — F03918 Unspecified dementia, unspecified severity, with other behavioral disturbance: Secondary | ICD-10-CM | POA: Diagnosis not present

## 2023-08-21 DIAGNOSIS — M6281 Muscle weakness (generalized): Secondary | ICD-10-CM | POA: Diagnosis not present

## 2023-08-22 DIAGNOSIS — F03918 Unspecified dementia, unspecified severity, with other behavioral disturbance: Secondary | ICD-10-CM | POA: Diagnosis not present

## 2023-08-22 DIAGNOSIS — M6281 Muscle weakness (generalized): Secondary | ICD-10-CM | POA: Diagnosis not present

## 2023-08-24 DIAGNOSIS — F03918 Unspecified dementia, unspecified severity, with other behavioral disturbance: Secondary | ICD-10-CM | POA: Diagnosis not present

## 2023-08-24 DIAGNOSIS — M6281 Muscle weakness (generalized): Secondary | ICD-10-CM | POA: Diagnosis not present

## 2023-08-25 DIAGNOSIS — M6281 Muscle weakness (generalized): Secondary | ICD-10-CM | POA: Diagnosis not present

## 2023-08-25 DIAGNOSIS — F03918 Unspecified dementia, unspecified severity, with other behavioral disturbance: Secondary | ICD-10-CM | POA: Diagnosis not present

## 2023-08-26 DIAGNOSIS — M6281 Muscle weakness (generalized): Secondary | ICD-10-CM | POA: Diagnosis not present

## 2023-08-26 DIAGNOSIS — F03918 Unspecified dementia, unspecified severity, with other behavioral disturbance: Secondary | ICD-10-CM | POA: Diagnosis not present

## 2023-08-27 DIAGNOSIS — M6281 Muscle weakness (generalized): Secondary | ICD-10-CM | POA: Diagnosis not present

## 2023-08-27 DIAGNOSIS — F03918 Unspecified dementia, unspecified severity, with other behavioral disturbance: Secondary | ICD-10-CM | POA: Diagnosis not present

## 2023-08-28 DIAGNOSIS — F03918 Unspecified dementia, unspecified severity, with other behavioral disturbance: Secondary | ICD-10-CM | POA: Diagnosis not present

## 2023-08-28 DIAGNOSIS — M6281 Muscle weakness (generalized): Secondary | ICD-10-CM | POA: Diagnosis not present

## 2023-08-31 DIAGNOSIS — F03918 Unspecified dementia, unspecified severity, with other behavioral disturbance: Secondary | ICD-10-CM | POA: Diagnosis not present

## 2023-08-31 DIAGNOSIS — M6281 Muscle weakness (generalized): Secondary | ICD-10-CM | POA: Diagnosis not present

## 2023-09-01 DIAGNOSIS — Z23 Encounter for immunization: Secondary | ICD-10-CM | POA: Diagnosis not present

## 2023-09-01 DIAGNOSIS — M6281 Muscle weakness (generalized): Secondary | ICD-10-CM | POA: Diagnosis not present

## 2023-09-01 DIAGNOSIS — F03918 Unspecified dementia, unspecified severity, with other behavioral disturbance: Secondary | ICD-10-CM | POA: Diagnosis not present

## 2023-09-02 DIAGNOSIS — F03918 Unspecified dementia, unspecified severity, with other behavioral disturbance: Secondary | ICD-10-CM | POA: Diagnosis not present

## 2023-09-02 DIAGNOSIS — M6281 Muscle weakness (generalized): Secondary | ICD-10-CM | POA: Diagnosis not present

## 2023-09-03 DIAGNOSIS — M6281 Muscle weakness (generalized): Secondary | ICD-10-CM | POA: Diagnosis not present

## 2023-09-03 DIAGNOSIS — F03918 Unspecified dementia, unspecified severity, with other behavioral disturbance: Secondary | ICD-10-CM | POA: Diagnosis not present

## 2023-09-03 DIAGNOSIS — M24541 Contracture, right hand: Secondary | ICD-10-CM | POA: Diagnosis not present

## 2023-09-04 DIAGNOSIS — F03918 Unspecified dementia, unspecified severity, with other behavioral disturbance: Secondary | ICD-10-CM | POA: Diagnosis not present

## 2023-09-04 DIAGNOSIS — M6281 Muscle weakness (generalized): Secondary | ICD-10-CM | POA: Diagnosis not present

## 2023-09-08 DIAGNOSIS — M6281 Muscle weakness (generalized): Secondary | ICD-10-CM | POA: Diagnosis not present

## 2023-09-08 DIAGNOSIS — F03918 Unspecified dementia, unspecified severity, with other behavioral disturbance: Secondary | ICD-10-CM | POA: Diagnosis not present

## 2023-09-09 DIAGNOSIS — M6281 Muscle weakness (generalized): Secondary | ICD-10-CM | POA: Diagnosis not present

## 2023-09-09 DIAGNOSIS — L89154 Pressure ulcer of sacral region, stage 4: Secondary | ICD-10-CM | POA: Diagnosis not present

## 2023-09-09 DIAGNOSIS — F03918 Unspecified dementia, unspecified severity, with other behavioral disturbance: Secondary | ICD-10-CM | POA: Diagnosis not present

## 2023-09-09 DIAGNOSIS — Z872 Personal history of diseases of the skin and subcutaneous tissue: Secondary | ICD-10-CM | POA: Diagnosis not present

## 2023-09-10 DIAGNOSIS — M6281 Muscle weakness (generalized): Secondary | ICD-10-CM | POA: Diagnosis not present

## 2023-09-10 DIAGNOSIS — F03918 Unspecified dementia, unspecified severity, with other behavioral disturbance: Secondary | ICD-10-CM | POA: Diagnosis not present

## 2023-09-11 DIAGNOSIS — M6281 Muscle weakness (generalized): Secondary | ICD-10-CM | POA: Diagnosis not present

## 2023-09-11 DIAGNOSIS — F03918 Unspecified dementia, unspecified severity, with other behavioral disturbance: Secondary | ICD-10-CM | POA: Diagnosis not present

## 2023-09-12 DIAGNOSIS — F03918 Unspecified dementia, unspecified severity, with other behavioral disturbance: Secondary | ICD-10-CM | POA: Diagnosis not present

## 2023-09-12 DIAGNOSIS — M6281 Muscle weakness (generalized): Secondary | ICD-10-CM | POA: Diagnosis not present

## 2023-09-15 DIAGNOSIS — F03918 Unspecified dementia, unspecified severity, with other behavioral disturbance: Secondary | ICD-10-CM | POA: Diagnosis not present

## 2023-09-15 DIAGNOSIS — M6281 Muscle weakness (generalized): Secondary | ICD-10-CM | POA: Diagnosis not present

## 2023-09-16 DIAGNOSIS — L89154 Pressure ulcer of sacral region, stage 4: Secondary | ICD-10-CM | POA: Diagnosis not present

## 2023-09-16 DIAGNOSIS — M6281 Muscle weakness (generalized): Secondary | ICD-10-CM | POA: Diagnosis not present

## 2023-09-16 DIAGNOSIS — F03918 Unspecified dementia, unspecified severity, with other behavioral disturbance: Secondary | ICD-10-CM | POA: Diagnosis not present

## 2023-09-23 DIAGNOSIS — L89154 Pressure ulcer of sacral region, stage 4: Secondary | ICD-10-CM | POA: Diagnosis not present

## 2023-09-30 DIAGNOSIS — L89154 Pressure ulcer of sacral region, stage 4: Secondary | ICD-10-CM | POA: Diagnosis not present
